# Patient Record
Sex: Female | Born: 1935 | Race: Black or African American | Hispanic: No | Smoking: Never smoker
Health system: Southern US, Community
[De-identification: ages and names within clinical notes are randomized; demographics above are authoritative.]

## PROBLEM LIST (undated history)

## (undated) DIAGNOSIS — R569 Unspecified convulsions: Secondary | ICD-10-CM

## (undated) DIAGNOSIS — D649 Anemia, unspecified: Secondary | ICD-10-CM

## (undated) DIAGNOSIS — S7290XA Unspecified fracture of unspecified femur, initial encounter for closed fracture: Secondary | ICD-10-CM

## (undated) DIAGNOSIS — E46 Unspecified protein-calorie malnutrition: Secondary | ICD-10-CM

## (undated) DIAGNOSIS — F0390 Unspecified dementia without behavioral disturbance: Secondary | ICD-10-CM

## (undated) DIAGNOSIS — N289 Disorder of kidney and ureter, unspecified: Secondary | ICD-10-CM

## (undated) DIAGNOSIS — R262 Difficulty in walking, not elsewhere classified: Secondary | ICD-10-CM

## (undated) DIAGNOSIS — I10 Essential (primary) hypertension: Secondary | ICD-10-CM

## (undated) DIAGNOSIS — R6251 Failure to thrive (child): Secondary | ICD-10-CM

## (undated) DIAGNOSIS — R1310 Dysphagia, unspecified: Secondary | ICD-10-CM

## (undated) HISTORY — PX: MOUTH SURGERY: SHX715

## (undated) HISTORY — PX: APPENDECTOMY: SHX54

---

## 2009-07-27 ENCOUNTER — Ambulatory Visit (HOSPITAL_COMMUNITY): Admission: RE | Admit: 2009-07-27 | Discharge: 2009-07-27 | Payer: Self-pay | Admitting: Internal Medicine

## 2009-08-05 ENCOUNTER — Ambulatory Visit (HOSPITAL_COMMUNITY): Admission: RE | Admit: 2009-08-05 | Discharge: 2009-08-05 | Payer: Self-pay | Admitting: Internal Medicine

## 2010-09-20 ENCOUNTER — Other Ambulatory Visit (HOSPITAL_COMMUNITY): Payer: Self-pay | Admitting: Internal Medicine

## 2010-09-20 DIAGNOSIS — Z139 Encounter for screening, unspecified: Secondary | ICD-10-CM

## 2010-09-28 ENCOUNTER — Ambulatory Visit (HOSPITAL_COMMUNITY)
Admission: RE | Admit: 2010-09-28 | Discharge: 2010-09-28 | Disposition: A | Discharge status: Home / Self Care | Payer: Medicare Other | Source: Ambulatory Visit | Attending: Internal Medicine | Admitting: Internal Medicine

## 2010-09-28 DIAGNOSIS — Z1231 Encounter for screening mammogram for malignant neoplasm of breast: Secondary | ICD-10-CM | POA: Insufficient documentation

## 2010-09-28 DIAGNOSIS — Z139 Encounter for screening, unspecified: Secondary | ICD-10-CM

## 2010-10-09 ENCOUNTER — Emergency Department (HOSPITAL_COMMUNITY)
Admission: EM | Admit: 2010-10-09 | Discharge: 2010-10-09 | Disposition: A | Discharge status: Home / Self Care | Payer: Medicare Other | Attending: Emergency Medicine | Admitting: Emergency Medicine

## 2010-10-09 ENCOUNTER — Encounter: Payer: Self-pay | Admitting: *Deleted

## 2010-10-09 DIAGNOSIS — R51 Headache: Secondary | ICD-10-CM

## 2010-10-09 DIAGNOSIS — I10 Essential (primary) hypertension: Secondary | ICD-10-CM | POA: Insufficient documentation

## 2010-10-09 DIAGNOSIS — R109 Unspecified abdominal pain: Secondary | ICD-10-CM | POA: Insufficient documentation

## 2010-10-09 HISTORY — DX: Essential (primary) hypertension: I10

## 2010-10-09 LAB — URINALYSIS, ROUTINE W REFLEX MICROSCOPIC
Glucose, UA: NEGATIVE mg/dL
Protein, ur: NEGATIVE mg/dL
Specific Gravity, Urine: 1.01 (ref 1.005–1.030)
pH: 6 (ref 5.0–8.0)

## 2010-10-09 LAB — URINE MICROSCOPIC-ADD ON

## 2010-10-09 MED ORDER — ACETAMINOPHEN 500 MG PO TABS: 1000.0000 mg | ORAL_TABLET | Freq: Once | ORAL | Status: DC

## 2010-10-09 NOTE — ED Provider Notes (Signed)
History  Scribed for Dr. Hyacinth Meeker . The chart was partially scribed by Gilman Schmidt.   CSN: [id number] Arrival date & time: 10/09/2010  7:04 PM  Chief Complaint  Patient presents with  . pt c/o hot feeling to head and back    HPI Comments: Pt arrives t 'ed about 7 hours after mopping her floor with somebleach.  She states that she had mild L sided headache which was initially moderate abut has since resolved spontaneously.  She has associated lower back pain which ihas also resolved.  Theres is no aassociated fever, chills, nausea, vomiting, diarrhea, change in vision, cough, sob, or other c/o  She has no c/o on my evaluation.  The history is provided by the patient and a relative.    Past Medical History  Diagnosis Date  . Hypertension     Past Surgical History  Procedure Date  . Mouth surgery     No family history on file.  History  Substance Use Topics  . Smoking status: Never Smoker   . Smokeless tobacco: Not on file  . Alcohol Use: No    OB History    Grav Para Term Preterm Abortions TAB SAB Ect Mult Living                  Review of Systems  All other systems reviewed and are negative.    Physical Exam  BP 169/65  Pulse 104  Temp(Src) 97.5 F (36.4 C) (Oral)  Resp 18  Ht 5\' 4"  (1.626 m)  Wt 151 lb 8 oz (68.72 kg)  BMI 26.00 kg/m2  SpO2 100%  Physical Exam  Nursing note and vitals reviewed. Constitutional: She appears well-developed and well-nourished. No distress.  HENT:  Head: Normocephalic and atraumatic.  Mouth/Throat: Oropharynx is clear and moist. No oropharyngeal exudate.       Dysconjugate gaze - states this is normal for her  Eyes: Conjunctivae and EOM are normal. Pupils are equal, round, and reactive to light. Right eye exhibits no discharge. Left eye exhibits no discharge. No scleral icterus.  Neck: Normal range of motion. Neck supple. No JVD present. No thyromegaly present.  Cardiovascular: Normal rate, regular rhythm, normal heart sounds and  intact distal pulses.  Exam reveals no gallop and no friction rub.   No murmur heard. Pulmonary/Chest: Effort normal and breath sounds normal. No respiratory distress. She has no wheezes. She has no rales.  Abdominal: Soft. Bowel sounds are normal. She exhibits no distension and no mass. There is no tenderness.       Non tender abd and no CVA ttp  Musculoskeletal: Normal range of motion. She exhibits no edema and no tenderness.       No ttp over the spine or the paraspinal areas   Lymphadenopathy:    She has no cervical adenopathy.  Neurological: She is alert. Coordination normal.  Skin: Skin is warm and dry. No rash noted. No erythema.  Psychiatric: She has a normal mood and affect. Her behavior is normal.    ED Course  Procedures  MDM Pt is well appearing VS normal on d/c, no fever, no abd pain, no vomiting, no headache and normal ua.  No indiations for further w/u at this time and pt understands need for return for severe or worsening symptoms.  I have reviewed the results with pt and family who is aware of findings and has expressed understanding and need for appropriate follow up.  All of pt questions pertaining to results have been  answered.   Results for orders placed during the hospital encounter of 10/09/10  URINALYSIS, ROUTINE W REFLEX MICROSCOPIC      Component Value Range   Color, Urine STRAW (*) YELLOW    Appearance CLEAR  CLEAR    Specific Gravity, Urine 1.010  1.005 - 1.030    pH 6.0  5.0 - 8.0    Glucose, UA NEGATIVE  NEGATIVE (mg/dL)   Hgb urine dipstick NEGATIVE  NEGATIVE    Bilirubin Urine NEGATIVE  NEGATIVE    Ketones, ur NEGATIVE  NEGATIVE (mg/dL)   Protein, ur NEGATIVE  NEGATIVE (mg/dL)   Urobilinogen, UA 0.2  0.0 - 1.0 (mg/dL)   Nitrite NEGATIVE  NEGATIVE    Leukocytes, UA TRACE (*) NEGATIVE   URINE MICROSCOPIC-ADD ON      Component Value Range   WBC, UA 3-6  <3 (WBC/hpf)   RBC / HPF 0-2  <3 (RBC/hpf)    I personally performed the services described  in this documentation, which was scribed in my presence. The recorded information has been reviewed and considered. No att. providers found         Vida Roller, MD 10/10/10 [phone]

## 2010-10-09 NOTE — ED Notes (Signed)
Pt self ambulated out stating no needs 

## 2010-10-09 NOTE — ED Notes (Signed)
Pt states she was mopping her floor with bleach and she began to have hot feeling to her head and back.

## 2010-10-12 LAB — URINE CULTURE

## 2011-03-15 DIAGNOSIS — Z0000 Encounter for general adult medical examination without abnormal findings: Secondary | ICD-10-CM | POA: Diagnosis not present

## 2011-03-15 DIAGNOSIS — I10 Essential (primary) hypertension: Secondary | ICD-10-CM | POA: Diagnosis not present

## 2011-06-20 DIAGNOSIS — I10 Essential (primary) hypertension: Secondary | ICD-10-CM | POA: Diagnosis not present

## 2011-06-20 DIAGNOSIS — M1990 Unspecified osteoarthritis, unspecified site: Secondary | ICD-10-CM | POA: Diagnosis not present

## 2011-08-01 DIAGNOSIS — D649 Anemia, unspecified: Secondary | ICD-10-CM | POA: Diagnosis not present

## 2011-08-01 DIAGNOSIS — M7989 Other specified soft tissue disorders: Secondary | ICD-10-CM | POA: Diagnosis not present

## 2011-08-01 DIAGNOSIS — R609 Edema, unspecified: Secondary | ICD-10-CM | POA: Diagnosis not present

## 2011-08-01 DIAGNOSIS — I10 Essential (primary) hypertension: Secondary | ICD-10-CM | POA: Diagnosis not present

## 2011-09-19 DIAGNOSIS — I10 Essential (primary) hypertension: Secondary | ICD-10-CM | POA: Diagnosis not present

## 2011-10-31 DIAGNOSIS — I10 Essential (primary) hypertension: Secondary | ICD-10-CM | POA: Diagnosis not present

## 2012-02-07 DIAGNOSIS — M1990 Unspecified osteoarthritis, unspecified site: Secondary | ICD-10-CM | POA: Diagnosis not present

## 2012-02-07 DIAGNOSIS — I10 Essential (primary) hypertension: Secondary | ICD-10-CM | POA: Diagnosis not present

## 2012-02-14 DIAGNOSIS — S01501A Unspecified open wound of lip, initial encounter: Secondary | ICD-10-CM | POA: Diagnosis not present

## 2012-03-24 ENCOUNTER — Other Ambulatory Visit (HOSPITAL_COMMUNITY): Payer: Self-pay | Admitting: Internal Medicine

## 2012-03-24 DIAGNOSIS — Z139 Encounter for screening, unspecified: Secondary | ICD-10-CM

## 2012-03-29 ENCOUNTER — Ambulatory Visit (HOSPITAL_COMMUNITY)
Admission: RE | Admit: 2012-03-29 | Discharge: 2012-03-29 | Disposition: A | Discharge status: Home / Self Care | Payer: Medicare Other | Source: Ambulatory Visit | Attending: Internal Medicine | Admitting: Internal Medicine

## 2012-03-29 DIAGNOSIS — Z1231 Encounter for screening mammogram for malignant neoplasm of breast: Secondary | ICD-10-CM | POA: Insufficient documentation

## 2012-03-29 DIAGNOSIS — Z139 Encounter for screening, unspecified: Secondary | ICD-10-CM

## 2012-05-08 DIAGNOSIS — I10 Essential (primary) hypertension: Secondary | ICD-10-CM | POA: Diagnosis not present

## 2012-08-14 DIAGNOSIS — I10 Essential (primary) hypertension: Secondary | ICD-10-CM | POA: Diagnosis not present

## 2012-08-14 DIAGNOSIS — E7800 Pure hypercholesterolemia, unspecified: Secondary | ICD-10-CM | POA: Diagnosis not present

## 2012-11-20 DIAGNOSIS — I10 Essential (primary) hypertension: Secondary | ICD-10-CM | POA: Diagnosis not present

## 2012-11-20 DIAGNOSIS — M1990 Unspecified osteoarthritis, unspecified site: Secondary | ICD-10-CM | POA: Diagnosis not present

## 2012-11-28 DIAGNOSIS — I10 Essential (primary) hypertension: Secondary | ICD-10-CM | POA: Diagnosis not present

## 2012-11-28 DIAGNOSIS — M810 Age-related osteoporosis without current pathological fracture: Secondary | ICD-10-CM | POA: Diagnosis not present

## 2012-11-28 DIAGNOSIS — M1990 Unspecified osteoarthritis, unspecified site: Secondary | ICD-10-CM | POA: Diagnosis not present

## 2013-01-15 ENCOUNTER — Encounter (HOSPITAL_COMMUNITY): Payer: Self-pay | Admitting: Emergency Medicine

## 2013-01-15 ENCOUNTER — Emergency Department (HOSPITAL_COMMUNITY)
Admission: EM | Admit: 2013-01-15 | Discharge: 2013-01-15 | Disposition: A | Discharge status: Home / Self Care | Payer: Medicare Other | Attending: Emergency Medicine | Admitting: Emergency Medicine

## 2013-01-15 DIAGNOSIS — R112 Nausea with vomiting, unspecified: Secondary | ICD-10-CM | POA: Diagnosis present

## 2013-01-15 DIAGNOSIS — R1011 Right upper quadrant pain: Secondary | ICD-10-CM | POA: Insufficient documentation

## 2013-01-15 DIAGNOSIS — Z79899 Other long term (current) drug therapy: Secondary | ICD-10-CM | POA: Insufficient documentation

## 2013-01-15 DIAGNOSIS — R1013 Epigastric pain: Secondary | ICD-10-CM | POA: Diagnosis not present

## 2013-01-15 DIAGNOSIS — R1012 Left upper quadrant pain: Secondary | ICD-10-CM | POA: Diagnosis not present

## 2013-01-15 DIAGNOSIS — I10 Essential (primary) hypertension: Secondary | ICD-10-CM | POA: Insufficient documentation

## 2013-01-15 DIAGNOSIS — R109 Unspecified abdominal pain: Secondary | ICD-10-CM

## 2013-01-15 LAB — URINALYSIS, ROUTINE W REFLEX MICROSCOPIC
Nitrite: NEGATIVE
Protein, ur: NEGATIVE mg/dL
Specific Gravity, Urine: 1.015 (ref 1.005–1.030)

## 2013-01-15 LAB — COMPREHENSIVE METABOLIC PANEL
ALT: 10 U/L (ref 0–35)
Albumin: 4 g/dL (ref 3.5–5.2)
Alkaline Phosphatase: 107 U/L (ref 39–117)
BUN: 21 mg/dL (ref 6–23)
CO2: 20 mEq/L (ref 19–32)
Chloride: 100 mEq/L (ref 96–112)
GFR calc Af Amer: 49 mL/min — ABNORMAL LOW (ref >90)
GFR calc non Af Amer: 42 mL/min — ABNORMAL LOW (ref >90)
Sodium: 137 mEq/L (ref 135–145)
Total Bilirubin: 0.2 mg/dL — ABNORMAL LOW (ref 0.3–1.2)

## 2013-01-15 LAB — CBC WITH DIFFERENTIAL/PLATELET
Basophils Relative: 1 % (ref 0–1)
Lymphocytes Relative: 15 % (ref 12–46)
Lymphs Abs: 1.1 10*3/uL (ref 0.7–4.0)
MCHC: 31.8 g/dL (ref 30.0–36.0)
Neutro Abs: 5.9 10*3/uL (ref 1.7–7.7)
Neutrophils Relative %: 79 % — ABNORMAL HIGH (ref 43–77)
Platelets: 295 10*3/uL (ref 150–400)
RBC: 3.68 MIL/uL — ABNORMAL LOW (ref 3.87–5.11)
WBC: 7.3 10*3/uL (ref 4.0–10.5)

## 2013-01-15 LAB — LIPASE, BLOOD: Lipase: 69 U/L — ABNORMAL HIGH (ref 11–59)

## 2013-01-15 LAB — URINE MICROSCOPIC-ADD ON

## 2013-01-15 LAB — TROPONIN I: Troponin I: <0.3 ng/mL (ref <0.30)

## 2013-01-15 MED ORDER — ONDANSETRON HCL 4 MG/2ML IJ SOLN
4.0000 mg | Freq: Once | INTRAMUSCULAR | Status: AC
  Administered 2013-01-15: 4 mg via INTRAVENOUS

## 2013-01-15 MED ORDER — ONDANSETRON HCL 4 MG/2ML IJ SOLN
INTRAMUSCULAR | Status: AC
  Administered 2013-01-15: 4 mg via INTRAVENOUS
  Filled 2013-01-15: qty 2

## 2013-01-15 MED ORDER — ONDANSETRON 4 MG PO TBDP: 4.0000 mg | ORAL_TABLET | Freq: Three times a day (TID) | ORAL | Status: DC | PRN

## 2013-01-15 MED ORDER — GI COCKTAIL ~~LOC~~
30.0000 mL | Freq: Once | ORAL | Status: AC
  Administered 2013-01-15: 30 mL via ORAL

## 2013-01-15 NOTE — ED Notes (Signed)
Pt reports onset of n/v and bilat upper abd pain that began yesterday evening approx 1900 - pt denies any blood noted in emesis, diarrhea or fever.

## 2013-01-15 NOTE — ED Provider Notes (Signed)
CSN: [id number]     Arrival date & time 01/15/13  0301 History   First MD Initiated Contact with Patient 01/15/13 0324     Chief Complaint  Patient presents with  . Nausea  . Emesis  . Abdominal Pain   (Consider location/radiation/quality/duration/timing/severity/associated sxs/prior Treatment) HPI Comments: 79 year old female who presents with a complaint of bilateral upper abdominal pain that began approximately 9 hours prior to arrival. This was at 7:00 at night, she describes it as a cramping and aching sensation that seems to come and go. It comes in waves, it is associated with nausea and vomiting several times prior to arrival but no changes in urinary habits, no abnormalities of the stool including no diarrhea or rectal bleeding and no chest pain or difficulty breathing. She denies having these symptoms in the past, she has had no abdominal surgery except for an appendectomy as a child. She denies alcohol or tobacco use and has no history of cardiac or pulmonary disease. She does not have any pain or nausea at this time.  Patient is a 79 y.o. female presenting with vomiting and abdominal pain. The history is provided by the patient and a relative.  Emesis Associated symptoms: abdominal pain   Abdominal Pain Associated symptoms: vomiting     Past Medical History  Diagnosis Date  . Hypertension    Past Surgical History  Procedure Laterality Date  . Mouth surgery     History reviewed. No pertinent family history. History  Substance Use Topics  . Smoking status: Never Smoker   . Smokeless tobacco: Not on file  . Alcohol Use: No   OB History   Grav Para Term Preterm Abortions TAB SAB Ect Mult Living                 Review of Systems  Gastrointestinal: Positive for vomiting and abdominal pain.  All other systems reviewed and are negative.    Allergies  Review of patient's allergies indicates no known allergies.  Home Medications   Current Outpatient Rx  Name   Route  Sig  Dispense  Refill  . amLODipine (NORVASC) 10 MG tablet   Oral   Take 10 mg by mouth daily.           Marland Kitchen lisinopril-hydrochlorothiazide (PRINZIDE,ZESTORETIC) 20-12.5 MG per tablet   Oral   Take 1 tablet by mouth daily.           . ondansetron (ZOFRAN ODT) 4 MG disintegrating tablet   Oral   Take 1 tablet (4 mg total) by mouth every 8 (eight) hours as needed for nausea.   10 tablet   0    BP 139/73  Pulse 110  Temp(Src) 98.1 F (36.7 C) (Oral)  Resp 20  Ht 5\' 5"  (1.651 m)  Wt 162 lb (73.483 kg)  BMI 26.96 kg/m2  SpO2 99% Physical Exam  Nursing note and vitals reviewed. Constitutional: She appears well-developed and well-nourished. No distress.  HENT:  Head: Normocephalic and atraumatic.  Mouth/Throat: Oropharynx is clear and moist. No oropharyngeal exudate.  Eyes: Conjunctivae and EOM are normal. Pupils are equal, round, and reactive to light. Right eye exhibits no discharge. Left eye exhibits no discharge. No scleral icterus.  Neck: Normal range of motion. Neck supple. No JVD present. No thyromegaly present.  Cardiovascular: Regular rhythm, normal heart sounds and intact distal pulses.  Exam reveals no gallop and no friction rub.   No murmur heard. Heart rate of 110 beats per minute on exam, strong pulses  at the radial arteries, no JVD  Pulmonary/Chest: Effort normal and breath sounds normal. No respiratory distress. She has no wheezes. She has no rales.  Abdominal: Soft. Bowel sounds are normal. She exhibits no distension and no mass. There is tenderness (mild tenderness in the epigastrium, right upper quadrant and left upper quadrant.). There is no rebound and no guarding.  Musculoskeletal: Normal range of motion. She exhibits no edema and no tenderness.  Lymphadenopathy:    She has no cervical adenopathy.  Neurological: She is alert. Coordination normal.  Skin: Skin is warm and dry. No rash noted. No erythema.  Psychiatric: She has a normal mood and affect.  Her behavior is normal.    ED Course  Procedures (including critical care time) Labs Review Labs Reviewed  COMPREHENSIVE METABOLIC PANEL - Abnormal; Notable for the following:    Glucose, Bld 173 (*)    Creatinine, Ser 1.18 (*)    Total Bilirubin 0.2 (*)    GFR calc non Af Amer 42 (*)    GFR calc Af Amer 49 (*)    All other components within normal limits  LIPASE, BLOOD - Abnormal; Notable for the following:    Lipase 69 (*)    All other components within normal limits  CBC WITH DIFFERENTIAL - Abnormal; Notable for the following:    RBC 3.68 (*)    Hemoglobin 10.8 (*)    HCT 34.0 (*)    Neutrophils Relative % 79 (*)    All other components within normal limits  URINALYSIS, ROUTINE W REFLEX MICROSCOPIC - Abnormal; Notable for the following:    Hgb urine dipstick TRACE (*)    Leukocytes, UA SMALL (*)    All other components within normal limits  URINE MICROSCOPIC-ADD ON - Abnormal; Notable for the following:    Squamous Epithelial / LPF FEW (*)    Bacteria, UA FEW (*)    All other components within normal limits  URINE CULTURE  TROPONIN I   Imaging Review No results found.  EKG Interpretation    Date/Time:  Tuesday January 15 2013 03:37:43 EST Ventricular Rate:  108 PR Interval:  180 QRS Duration: 78 QT Interval:  344 QTC Calculation: 460 R Axis:   40 Text Interpretation:  Sinus tachycardia Nonspecific T wave abnormality Abnormal ECG No previous ECGs available Confirmed by Alexx Giambra  MD, Audryana Hockenberry ([id number]) on 01/15/2013 4:11:05 AM            MDM   1. Abdominal pain   2. Nausea and vomiting    The patient has mild to moderate tenderness in the upper abdomen, she has a mild tachycardia and is having intermittent nausea and vomiting. We'll evaluate for abdominal abnormalities including pancreatitis and cholecystitis with laboratory workup, urinalysis, troponin and EKG as well though this seems less likely given the patient's reproducible symptoms. Again she has no chest  pain or shortness of breath.  Pt reexamined at 4:30 - has no pain and no nausea after the GI cocktail - labs reviewed and shows normal LFT's, minimal elevation in lipase, normal WBC and slight anemia.  Trop normal, UA pending.  UA normal - pt asymptomatic,  Stable for d/c.    Pt and family aware of findings and need for f/u.  Meds given in ED:  Medications  ondansetron (ZOFRAN) injection 4 mg (4 mg Intravenous Given 01/15/13 0352)  gi cocktail (Maalox,Lidocaine,Donnatal) (30 mLs Oral Given 01/15/13 0358)    New Prescriptions   ONDANSETRON (ZOFRAN ODT) 4 MG DISINTEGRATING TABLET  Take 1 tablet (4 mg total) by mouth every 8 (eight) hours as needed for nausea.      Vida Roller, MD 01/15/13 [phone]

## 2013-01-15 NOTE — ED Notes (Signed)
Pt ambulating independently w/ steady gait on d/c in no acute distress, A&Ox4. D/c instructions reviewed w/ pt and family - pt and family deny any further questions or concerns at present. Rx given x1  

## 2013-01-16 LAB — URINE CULTURE: Colony Count: 30000

## 2013-05-06 DIAGNOSIS — M25569 Pain in unspecified knee: Secondary | ICD-10-CM | POA: Diagnosis not present

## 2013-05-06 DIAGNOSIS — M25559 Pain in unspecified hip: Secondary | ICD-10-CM | POA: Diagnosis not present

## 2013-05-06 DIAGNOSIS — I10 Essential (primary) hypertension: Secondary | ICD-10-CM | POA: Diagnosis not present

## 2013-05-13 ENCOUNTER — Ambulatory Visit (HOSPITAL_COMMUNITY)
Admission: RE | Admit: 2013-05-13 | Discharge: 2013-05-13 | Disposition: A | Discharge status: Home / Self Care | Payer: Medicare Other | Source: Ambulatory Visit | Attending: Internal Medicine | Admitting: Internal Medicine

## 2013-05-13 ENCOUNTER — Other Ambulatory Visit (HOSPITAL_COMMUNITY): Payer: Self-pay | Admitting: Internal Medicine

## 2013-05-13 DIAGNOSIS — M25559 Pain in unspecified hip: Secondary | ICD-10-CM | POA: Insufficient documentation

## 2013-05-13 DIAGNOSIS — M1610 Unilateral primary osteoarthritis, unspecified hip: Secondary | ICD-10-CM | POA: Diagnosis not present

## 2013-05-13 DIAGNOSIS — D259 Leiomyoma of uterus, unspecified: Secondary | ICD-10-CM | POA: Insufficient documentation

## 2013-05-13 DIAGNOSIS — M25561 Pain in right knee: Secondary | ICD-10-CM

## 2013-05-13 DIAGNOSIS — M25459 Effusion, unspecified hip: Secondary | ICD-10-CM | POA: Insufficient documentation

## 2013-05-13 DIAGNOSIS — IMO0002 Reserved for concepts with insufficient information to code with codable children: Secondary | ICD-10-CM | POA: Diagnosis not present

## 2013-05-13 DIAGNOSIS — M169 Osteoarthritis of hip, unspecified: Secondary | ICD-10-CM | POA: Diagnosis not present

## 2013-05-13 DIAGNOSIS — M1710 Unilateral primary osteoarthritis, unspecified knee: Secondary | ICD-10-CM | POA: Diagnosis not present

## 2013-05-13 DIAGNOSIS — M25551 Pain in right hip: Secondary | ICD-10-CM

## 2013-06-13 ENCOUNTER — Ambulatory Visit (INDEPENDENT_AMBULATORY_CARE_PROVIDER_SITE_OTHER): Payer: Medicare Other | Admitting: Orthopedic Surgery

## 2013-06-13 VITALS — BP 147/76 | Ht 64.0 in | Wt 158.0 lb

## 2013-06-13 DIAGNOSIS — M179 Osteoarthritis of knee, unspecified: Secondary | ICD-10-CM

## 2013-06-13 DIAGNOSIS — IMO0002 Reserved for concepts with insufficient information to code with codable children: Secondary | ICD-10-CM | POA: Diagnosis not present

## 2013-06-13 DIAGNOSIS — M1710 Unilateral primary osteoarthritis, unspecified knee: Secondary | ICD-10-CM | POA: Diagnosis not present

## 2013-06-13 NOTE — Patient Instructions (Signed)
Encounter Diagnosis  Name Primary?  Marland Kitchen Arthritis of knee, degenerative Yes   Osteoarthritis Osteoarthritis is a disease that causes soreness and swelling (inflammation) of a joint. It occurs when the cartilage at the affected joint wears down. Cartilage acts as a cushion, covering the ends of bones where they meet to form a joint. Osteoarthritis is the most common form of arthritis. It often occurs in older people. The joints affected most often by this condition include those in the:  Ends of the fingers.  Thumbs.  Neck.  Lower back.  Knees.  Hips. CAUSES  Over time, the cartilage that covers the ends of bones begins to wear away. This causes bone to rub on bone, producing pain and stiffness in the affected joints.  RISK FACTORS Certain factors can increase your chances of having osteoarthritis, including:  Older age.  Excessive body weight.  Overuse of joints. SIGNS AND SYMPTOMS   Pain, swelling, and stiffness in the joint.  Over time, the joint may lose its normal shape.  Small deposits of bone (osteophytes) may grow on the edges of the joint.  Bits of bone or cartilage can break off and float inside the joint space. This may cause more pain and damage. DIAGNOSIS  Your health care provider will do a physical exam and ask about your symptoms. Various tests may be ordered, such as:  X-rays of the affected joint.  An MRI scan.  Blood tests to rule out other types of arthritis.  Joint fluid tests. This involves using a needle to draw fluid from the joint and examining the fluid under a microscope. TREATMENT  Goals of treatment are to control pain and improve joint function. Treatment plans may include:  A prescribed exercise program that allows for rest and joint relief.  A weight control plan.  Pain relief techniques, such as:  Properly applied heat and cold.  Electric pulses delivered to nerve endings under the skin (transcutaneous electrical nerve  stimulation, TENS).  Massage.  Certain nutritional supplements.  Medicines to control pain, such as:  Acetaminophen.  Nonsteroidal anti-inflammatory drugs (NSAIDs), such as naproxen.  Narcotic or central-acting agents, such as tramadol.  Corticosteroids. These can be given orally or as an injection.  Surgery to reposition the bones and relieve pain (osteotomy) or to remove loose pieces of bone and cartilage. Joint replacement may be needed in advanced states of osteoarthritis. HOME CARE INSTRUCTIONS   Only take over-the-counter or prescription medicines as directed by your health care provider. Take all medicines exactly as instructed.  Maintain a healthy weight. Follow your health care provider's instructions for weight control. This may include dietary instructions.  Exercise as directed. Your health care provider can recommend specific types of exercise. These may include:  Strengthening exercises These are done to strengthen the muscles that support joints affected by arthritis. They can be performed with weights or with exercise bands to add resistance.  Aerobic activities These are exercises, such as brisk walking or low-impact aerobics, that get your heart pumping.  Range-of-motion activities These keep your joints limber.  Balance and agility exercises These help you maintain daily living skills.  Rest your affected joints as directed by your health care provider.  Follow up with your health care provider as directed. SEEK MEDICAL CARE IF:   Your skin turns red.  You develop a rash in addition to your joint pain.  You have worsening joint pain. SEEK IMMEDIATE MEDICAL CARE IF:  You have a significant loss of weight or appetite.  You have a fever along with joint or muscle aches.  You have night sweats. [employer] of Arthritis and Musculoskeletal and Skin Diseases: www.niams.SouthExposed.es [employer] on Aging:  http://kim-miller.com/ American College of Rheumatology: www.rheumatology.org Document Released: 01/24/2005 Document Revised: 11/14/2012 Document Reviewed: 10/01/2012 [facility] Patient Information 2014 [place], [place].

## 2013-06-14 ENCOUNTER — Encounter: Payer: Self-pay | Admitting: Orthopedic Surgery

## 2013-06-14 NOTE — Progress Notes (Signed)
Patient ID: Tina Kline, female   DOB: 1929-12-19, 80 y.o.   MRN: [id number]  Chief Complaint  Patient presents with  . Knee Pain    Right knee pain radiates to hip. Referred by Dr. Legrand Rams   HISTORY: 80 year old female presents with right knee pain long-standing. Denies any catching locking or giving way or mechanical symptoms. Mainly complains of dull aching pain over the right knee joint associated with swelling at times. Pain radiates somewhat proximally and distally but mainly is confined to the right knee. Denies back or hip pain. Pain is relieved by Tylenol extra strength on most occasions.  Review of systems has been completed and signed patient's self-reported review of systems. Please see for details.  She is very healthy. Past Surgical History  Procedure Laterality Date  . Mouth surgery     History  Substance Use Topics  . Smoking status: Never Smoker   . Smokeless tobacco: Not on file  . Alcohol Use: No    Vital signs:  BP 147/76  Ht 5\' 4"  (1.626 m)  Wt 158 lb (71.668 kg)  BMI 27.11 kg/m2  General the patient is well-developed and well-nourished grooming and hygiene are normal Oriented x3 Mood and affect normal Ambulation normal  Inspection of the left knee range of motion is limited by arthritis but she has 120 of flexion slight flexion contracture. No tenderness swelling. Ligaments are stable. Muscle tone normal. Skin clean dry and intact.  Right knee is tender over the medial joint line there is no effusion. Her knee flexion is approximately 120. All ligaments are stable. Motor exam is normal. Skin clean intact.  X-rays show arthritis of the knee mild to moderate Encounter Diagnosis  Name Primary?  Marland Kitchen Arthritis of knee, degenerative Yes    The patient is doing well with Tylenol continue Tylenol extra strength. He did some increased pain use ibuprofen or Aleve on an occasional basis. If her pain or symptoms worsen she will call back and we will further  discuss her treatment options.

## 2013-08-06 DIAGNOSIS — I10 Essential (primary) hypertension: Secondary | ICD-10-CM | POA: Diagnosis not present

## 2013-12-06 DIAGNOSIS — I10 Essential (primary) hypertension: Secondary | ICD-10-CM | POA: Diagnosis not present

## 2013-12-06 DIAGNOSIS — M1990 Unspecified osteoarthritis, unspecified site: Secondary | ICD-10-CM | POA: Diagnosis not present

## 2014-02-21 DIAGNOSIS — Z0000 Encounter for general adult medical examination without abnormal findings: Secondary | ICD-10-CM | POA: Diagnosis not present

## 2014-05-01 DIAGNOSIS — E780 Pure hypercholesterolemia: Secondary | ICD-10-CM | POA: Diagnosis not present

## 2014-05-01 DIAGNOSIS — R7301 Impaired fasting glucose: Secondary | ICD-10-CM | POA: Diagnosis not present

## 2014-05-01 DIAGNOSIS — R739 Hyperglycemia, unspecified: Secondary | ICD-10-CM | POA: Diagnosis not present

## 2014-05-01 DIAGNOSIS — E559 Vitamin D deficiency, unspecified: Secondary | ICD-10-CM | POA: Diagnosis not present

## 2014-05-01 DIAGNOSIS — I10 Essential (primary) hypertension: Secondary | ICD-10-CM | POA: Diagnosis not present

## 2014-05-23 DIAGNOSIS — I10 Essential (primary) hypertension: Secondary | ICD-10-CM | POA: Diagnosis not present

## 2014-05-23 DIAGNOSIS — M1990 Unspecified osteoarthritis, unspecified site: Secondary | ICD-10-CM | POA: Diagnosis not present

## 2014-08-25 DIAGNOSIS — I10 Essential (primary) hypertension: Secondary | ICD-10-CM | POA: Diagnosis not present

## 2014-08-25 DIAGNOSIS — M1990 Unspecified osteoarthritis, unspecified site: Secondary | ICD-10-CM | POA: Diagnosis not present

## 2014-11-07 ENCOUNTER — Emergency Department (HOSPITAL_COMMUNITY): Payer: Medicare Other

## 2014-11-07 ENCOUNTER — Other Ambulatory Visit (HOSPITAL_COMMUNITY): Payer: Self-pay | Admitting: Respiratory Therapy

## 2014-11-07 ENCOUNTER — Other Ambulatory Visit (HOSPITAL_COMMUNITY): Payer: Self-pay | Admitting: Internal Medicine

## 2014-11-07 ENCOUNTER — Ambulatory Visit (HOSPITAL_COMMUNITY)
Admission: RE | Admit: 2014-11-07 | Discharge: 2014-11-07 | Disposition: A | Discharge status: Home / Self Care | Payer: Medicare Other | Source: Ambulatory Visit | Attending: Internal Medicine | Admitting: Internal Medicine

## 2014-11-07 ENCOUNTER — Encounter (HOSPITAL_COMMUNITY): Payer: Self-pay | Admitting: Emergency Medicine

## 2014-11-07 ENCOUNTER — Emergency Department (HOSPITAL_COMMUNITY)
Admission: EM | Admit: 2014-11-07 | Discharge: 2014-11-07 | Disposition: A | Discharge status: Home / Self Care | Payer: Medicare Other | Attending: Emergency Medicine | Admitting: Emergency Medicine

## 2014-11-07 DIAGNOSIS — R739 Hyperglycemia, unspecified: Secondary | ICD-10-CM | POA: Diagnosis not present

## 2014-11-07 DIAGNOSIS — K449 Diaphragmatic hernia without obstruction or gangrene: Secondary | ICD-10-CM | POA: Diagnosis not present

## 2014-11-07 DIAGNOSIS — R55 Syncope and collapse: Secondary | ICD-10-CM | POA: Diagnosis not present

## 2014-11-07 DIAGNOSIS — Z79899 Other long term (current) drug therapy: Secondary | ICD-10-CM | POA: Insufficient documentation

## 2014-11-07 DIAGNOSIS — R112 Nausea with vomiting, unspecified: Secondary | ICD-10-CM

## 2014-11-07 DIAGNOSIS — R1013 Epigastric pain: Secondary | ICD-10-CM | POA: Diagnosis not present

## 2014-11-07 DIAGNOSIS — E559 Vitamin D deficiency, unspecified: Secondary | ICD-10-CM | POA: Diagnosis not present

## 2014-11-07 DIAGNOSIS — I10 Essential (primary) hypertension: Secondary | ICD-10-CM | POA: Diagnosis not present

## 2014-11-07 DIAGNOSIS — E780 Pure hypercholesterolemia: Secondary | ICD-10-CM | POA: Diagnosis not present

## 2014-11-07 DIAGNOSIS — R0989 Other specified symptoms and signs involving the circulatory and respiratory systems: Secondary | ICD-10-CM | POA: Diagnosis present

## 2014-11-07 LAB — I-STAT CHEM 8, ED
BUN: 22 mg/dL — ABNORMAL HIGH (ref 6–20)
CALCIUM ION: 1.21 mmol/L (ref 1.13–1.30)
CREATININE: 1.4 mg/dL — AB (ref 0.44–1.00)
Chloride: 104 mmol/L (ref 101–111)
GLUCOSE: 126 mg/dL — AB (ref 65–99)
HCT: 30 % — ABNORMAL LOW (ref 36.0–46.0)
HEMOGLOBIN: 10.2 g/dL — AB (ref 12.0–15.0)
POTASSIUM: 3.8 mmol/L (ref 3.5–5.1)
Sodium: 140 mmol/L (ref 135–145)
TCO2: 21 mmol/L (ref 0–100)

## 2014-11-07 MED ORDER — SODIUM CHLORIDE 0.9 % IV BOLUS (SEPSIS)
500.0000 mL | Freq: Once | INTRAVENOUS | Status: AC
  Administered 2014-11-07: 500 mL via INTRAVENOUS

## 2014-11-07 NOTE — ED Notes (Signed)
Pt and family state understanding of care given and follow up instructions

## 2014-11-07 NOTE — ED Provider Notes (Signed)
CSN: [id number]     Arrival date & time 11/07/14  1735 History   First MD Initiated Contact with Patient 11/07/14 1737     Chief Complaint  Patient presents with  . Foreign body in throat     (Consider location/radiation/quality/duration/timing/severity/associated sxs/prior Treatment) HPI Patient presents after an episode of chest pain, choking sensation, nausea, vomiting. Symptoms began after the patient was eating, about one hour prior to my evaluation. Currently, during my evaluation, the patient has no complains, denies any chest pain, dyspnea, nausea, or other changes from baseline. Patient states that she felt prior to the development of symptoms, which occurred after she was eating chicken livers. Patient denies substantial medical problems, but states that she has hypertension.  Past Medical History  Diagnosis Date  . Hypertension    Past Surgical History  Procedure Laterality Date  . Mouth surgery     History reviewed. No pertinent family history. Social History  Substance Use Topics  . Smoking status: Never Smoker   . Smokeless tobacco: None  . Alcohol Use: No   OB History    Gravida Para Term Preterm AB TAB SAB Ectopic Multiple Living   6 6 6       4      Review of Systems  Constitutional:       Per HPI, otherwise negative  HENT: Negative for ear pain.        Per HPI, otherwise negative  Respiratory:       Per HPI, otherwise negative  Cardiovascular:       Per HPI, otherwise negative  Gastrointestinal: Positive for nausea and vomiting.  Endocrine:       Negative aside from HPI  Genitourinary:       Neg aside from HPI   Musculoskeletal:       Per HPI, otherwise negative  Skin: Negative.   Neurological: Negative for syncope.      Allergies  Review of patient's allergies indicates no known allergies.  Home Medications   Prior to Admission medications   Medication Sig Start Date End Date Taking? Authorizing Provider  acetaminophen (TYLENOL) 500  MG tablet Take 500 mg by mouth every morning.   Yes Historical Provider, MD  amLODipine (NORVASC) 10 MG tablet Take 10 mg by mouth daily.   Yes Historical Provider, MD  lisinopril-hydrochlorothiazide (PRINZIDE,ZESTORETIC) 20-12.5 MG per tablet Take 1 tablet by mouth daily.     Yes Historical Provider, MD  naproxen sodium (ANAPROX) 220 MG tablet Take 220 mg by mouth every morning.   Yes Historical Provider, MD   BP 147/71 mmHg  Pulse 114  Temp(Src) 98.7 F (37.1 C) (Oral)  Resp 18  Ht 5\' 6"  (1.676 m)  Wt 156 lb (70.761 kg)  BMI 25.19 kg/m2  SpO2 99% Physical Exam  Constitutional: She is oriented to person, place, and time. She appears well-developed and well-nourished. No distress.  HENT:  Head: Normocephalic and atraumatic.  Eyes: Conjunctivae and EOM are normal.  Cardiovascular: Normal rate and regular rhythm.   Pulmonary/Chest: Effort normal and breath sounds normal. No stridor. No respiratory distress.  Abdominal: She exhibits no distension. There is no tenderness.  Musculoskeletal: She exhibits no edema.  Neurological: She is alert and oriented to person, place, and time. No cranial nerve deficit.  Skin: Skin is warm and dry.  Psychiatric: She has a normal mood and affect.  Nursing note and vitals reviewed.   ED Course  Procedures (including critical care time) Labs Review Labs Reviewed  I-STAT CHEM  8, ED - Abnormal; Notable for the following:    BUN 22 (*)    Creatinine, Ser 1.40 (*)    Glucose, Bld 126 (*)    Hemoglobin 10.2 (*)    HCT 30.0 (*)    All other components within normal limits  Randolm Idol, ED    Imaging Review Dg Chest 2 View  11/07/2014   CLINICAL DATA:  Acute choking episode today.  EXAM: CHEST  2 VIEW  COMPARISON:  None.  FINDINGS: Cardiomegaly identified.  A moderate hiatal hernia is present.  There is no evidence of focal airspace disease, pulmonary edema, suspicious pulmonary nodule/mass, pleural effusion, or pneumothorax. No acute bony  abnormalities are identified.  IMPRESSION: Cardiomegaly without evidence of active cardiopulmonary disease.  Moderate hiatal hernia.   Electronically Signed   By: Margarette Canada M.D.   On: 11/07/2014 19:18   I have personally reviewed and evaluated these images and lab results as part of my medical decision-making.  On repeat exam the patient continues to deny any ongoing chest pain. No additional vomiting, and she has been tolerating liquids without complication. I discussed all findings, including extra evidence of hiatal hernia with the patient and her family members.   MDM  He presents after episode of epigastric pain, nausea, vomiting, that occurred after eating. Here the patient has had no ongoing complaint, no foreign body sensation, no chest pain, no dyspnea, no evidence for decompensation. Patient's presentation is consistent with postprandial episode, possibly related to her hiatal hernia. Here she is tolerant of oral intake, and with no evidence for distress, imminent compensation chemistry she was discharged in stable condition to follow-up with primary care.  Carmin Muskrat, MD 11/07/14 2050

## 2014-11-07 NOTE — ED Notes (Signed)
Pt reports feeling like she has something stuck in her throat. Pt was eating chicken livers.

## 2014-11-07 NOTE — Discharge Instructions (Signed)
As discussed, your evaluation today has been largely reassuring.  But, it is important that you monitor your condition carefully, and do not hesitate to return to the ED if you develop new, or concerning changes in your condition. ? ?Otherwise, please follow-up with your physician for appropriate ongoing care. ? ?

## 2014-11-10 ENCOUNTER — Ambulatory Visit (HOSPITAL_COMMUNITY)
Admission: RE | Admit: 2014-11-10 | Discharge: 2014-11-10 | Disposition: A | Discharge status: Home / Self Care | Payer: Medicare Other | Source: Ambulatory Visit | Attending: Internal Medicine | Admitting: Internal Medicine

## 2014-11-10 DIAGNOSIS — I10 Essential (primary) hypertension: Secondary | ICD-10-CM | POA: Insufficient documentation

## 2014-11-10 DIAGNOSIS — R55 Syncope and collapse: Secondary | ICD-10-CM | POA: Insufficient documentation

## 2014-11-10 DIAGNOSIS — I6523 Occlusion and stenosis of bilateral carotid arteries: Secondary | ICD-10-CM | POA: Insufficient documentation

## 2014-11-16 NOTE — Progress Notes (Signed)
Patient ID: Tina Kline, female   DOB: 1930/02/08, 81 y.o.   MRN: [id number]     Cardiology Office Note   Date:  11/17/2014   ID:  Tina Kline, DOB 05/15/1929, MRN [id number]  PCP:  [name], MD  Cardiologist:   Jenkins Rouge, MD   No chief complaint on file.     History of Present Illness: Tina Kline is a 81 y.o. female who presents for syncope.  History of HTN.  Seen in ER 9/30 with chest pain after eating chicken livers and diagnosed with postprandial pain from hiatal hernia. Associated with nausea and vomiting.  CXR showed moderate hiatal hernia and CE NAD.  Labs reviewed troponin negative Hct 34 lipase mildly elevated    Carotid 11/10/14 reviewed plaque no significant stenosis   During office visit earlier 9/30 daughter indicated patient was hard to arouse from recliner the week before. This was not really syncope.  Sounds like patient was sleeping and took a while to wake up No seizure activity.  Daughter was calling EMS when "mom came to" was her normal self  She is widowed.  Does all ADL's and does not need cane or walker to ambulate Does have bad left knee  No previous cardiac issues.  Takes meds as ordered  Has 3 daughters and son that are local and look after her   Past Medical History  Diagnosis Date  . Hypertension     Past Surgical History  Procedure Laterality Date  . Mouth surgery       Current Outpatient Prescriptions  Medication Sig Dispense Refill  . acetaminophen (TYLENOL) 500 MG tablet Take 500 mg by mouth every morning.    Marland Kitchen amLODipine (NORVASC) 10 MG tablet Take 10 mg by mouth daily.    Marland Kitchen lisinopril-hydrochlorothiazide (PRINZIDE,ZESTORETIC) 20-12.5 MG per tablet Take 1 tablet by mouth daily.      . naproxen sodium (ANAPROX) 220 MG tablet Take 220 mg by mouth every morning.     No current facility-administered medications for this visit.    Allergies:   Review of patient's allergies indicates no known allergies.    Social  History:  The patient  reports that she has never smoked. She does not have any smokeless tobacco history on file. She reports that she does not drink alcohol or use illicit drugs.   Family History:  The patient's family history is not on file.    ROS:  Please see the history of present illness.   Otherwise, review of systems are positive for none.   All other systems are reviewed and negative.    PHYSICAL EXAM: VS:  BP 124/68 mmHg  Pulse 82  Ht 5\' 4"  (1.626 m)  Wt 68.493 kg (151 lb)  BMI 25.91 kg/m2  SpO2 99% , BMI Body mass index is 25.91 kg/(m^2). Affect appropriate Healthy:  appears stated age 27: skewed vision  Neck supple with no adenopathy JVP normal no bruits no thyromegaly Lungs clear with no wheezing and good diaphragmatic motion Heart:  S1/S2 no murmur, no rub, gallop or click PMI normal Abdomen: benighn, BS positve, no tenderness, no AAA no bruit.  No HSM or HJR Distal pulses intact with no bruits Plus one bilateral edema Neuro non-focal Skin warm and dry No muscular weakness Osteoarthritis of left knee    EKG:   11/07/14  SR rate 99 PVC nonspecific ST changes no change from 01/2013 when rate was 108   Recent Labs: 11/07/2014: BUN 22*; Creatinine, Ser 1.40*; Hemoglobin  10.2*; Potassium 3.8; Sodium 140    Lipid Panel No results found for: CHOL, TRIG, HDL, CHOLHDL, VLDL, LDLCALC, LDLDIRECT    Wt Readings from Last 3 Encounters:  11/17/14 68.493 kg (151 lb)  11/07/14 70.761 kg (156 lb)  06/13/13 71.668 kg (158 lb)      Other studies Reviewed: Additional studies/ records that were reviewed today include:  Dr Josephine Cables office notes and labs.    ASSESSMENT AND PLAN:  1.  "Syncope"  My history really doesn't support this  No high risk features on exam, ECG or history f/u echo to r/o structural heart disease 2. PVC: isolated on ECG no palpitations or clinical symptoms  Echo to interpret in context of EF 3. HTN:  Stable continue current meds  4.  Arthritis:  Tylenol / ASA for pain f/u ortho consider injections.     Current medicines are reviewed at length with the patient today.  The patient does not have concerns regarding medicines.  The following changes have been made:  no change  Labs/ tests ordered today include: Echo  No orders of the defined types were placed in this encounter.     Disposition:   FU with me PRN     Signed, Jenkins Rouge, MD  11/17/2014 9:56 AM    [facility] [address], [place], [place]  [address] Phone: [phone]; Fax: [phone]

## 2014-11-17 ENCOUNTER — Encounter: Payer: Self-pay | Admitting: Cardiovascular Disease

## 2014-11-17 ENCOUNTER — Ambulatory Visit (INDEPENDENT_AMBULATORY_CARE_PROVIDER_SITE_OTHER): Payer: Medicare Other | Admitting: Cardiovascular Disease

## 2014-11-17 VITALS — BP 124/68 | HR 82 | Ht 64.0 in | Wt 151.0 lb

## 2014-11-17 DIAGNOSIS — R55 Syncope and collapse: Secondary | ICD-10-CM

## 2014-11-17 NOTE — Patient Instructions (Signed)
Your physician recommends that you schedule a follow-up appointment in: as needed    Your physician recommends that you continue on your current medications as directed. Please refer to the Current Medication list given to you today.      Your physician has requested that you have an echocardiogram. Echocardiography is a painless test that uses sound waves to create images of your heart. It provides your doctor with information about the size and shape of your heart and how well your heart's chambers and valves are working. This procedure takes approximately one hour. There are no restrictions for this procedure.      Thank you for choosing [facility] !

## 2014-11-19 ENCOUNTER — Ambulatory Visit (HOSPITAL_COMMUNITY)
Admission: RE | Admit: 2014-11-19 | Discharge: 2014-11-19 | Disposition: A | Discharge status: Home / Self Care | Payer: Medicare Other | Source: Ambulatory Visit | Attending: Cardiovascular Disease | Admitting: Cardiovascular Disease

## 2014-11-19 DIAGNOSIS — R55 Syncope and collapse: Secondary | ICD-10-CM

## 2014-11-24 DIAGNOSIS — I10 Essential (primary) hypertension: Secondary | ICD-10-CM | POA: Diagnosis not present

## 2014-11-24 DIAGNOSIS — R55 Syncope and collapse: Secondary | ICD-10-CM | POA: Diagnosis not present

## 2014-11-24 DIAGNOSIS — D649 Anemia, unspecified: Secondary | ICD-10-CM | POA: Diagnosis not present

## 2015-03-31 DIAGNOSIS — M170 Bilateral primary osteoarthritis of knee: Secondary | ICD-10-CM | POA: Diagnosis not present

## 2015-03-31 DIAGNOSIS — E785 Hyperlipidemia, unspecified: Secondary | ICD-10-CM | POA: Diagnosis not present

## 2015-03-31 DIAGNOSIS — Z0000 Encounter for general adult medical examination without abnormal findings: Secondary | ICD-10-CM | POA: Diagnosis not present

## 2015-03-31 DIAGNOSIS — I10 Essential (primary) hypertension: Secondary | ICD-10-CM | POA: Diagnosis not present

## 2015-03-31 DIAGNOSIS — R739 Hyperglycemia, unspecified: Secondary | ICD-10-CM | POA: Diagnosis not present

## 2015-03-31 DIAGNOSIS — E559 Vitamin D deficiency, unspecified: Secondary | ICD-10-CM | POA: Diagnosis not present

## 2015-04-07 DIAGNOSIS — D649 Anemia, unspecified: Secondary | ICD-10-CM | POA: Diagnosis not present

## 2015-04-10 DIAGNOSIS — N182 Chronic kidney disease, stage 2 (mild): Secondary | ICD-10-CM | POA: Diagnosis not present

## 2015-04-10 DIAGNOSIS — D509 Iron deficiency anemia, unspecified: Secondary | ICD-10-CM | POA: Diagnosis not present

## 2015-04-10 DIAGNOSIS — I10 Essential (primary) hypertension: Secondary | ICD-10-CM | POA: Diagnosis not present

## 2015-04-20 ENCOUNTER — Telehealth: Payer: Self-pay

## 2015-04-20 NOTE — Telephone Encounter (Signed)
PT had received a letter to schedule colonoscopy. Someone left Vm to call to schedule.  I returned call, many rings and no answer.

## 2015-04-24 NOTE — Telephone Encounter (Signed)
I called and spoke to pt's daughter, Rayna Sexton, who said her mom has never had a colonoscopy. Said she is anemic. ( In the ov note from Dr. Legrand Rams he said pt was given a stool card, [name] said they are working on getting that done).   OV with Neil Crouch, PA on 05/08/2015 at 9:30 Am.

## 2015-05-08 ENCOUNTER — Ambulatory Visit: Payer: Medicare Other | Admitting: Gastroenterology

## 2015-06-08 ENCOUNTER — Ambulatory Visit (INDEPENDENT_AMBULATORY_CARE_PROVIDER_SITE_OTHER): Payer: Medicare Other | Admitting: Gastroenterology

## 2015-06-08 ENCOUNTER — Encounter: Payer: Self-pay | Admitting: Gastroenterology

## 2015-06-08 VITALS — BP 145/78 | HR 107 | Temp 98.7°F | Ht 63.0 in | Wt 148.4 lb

## 2015-06-08 DIAGNOSIS — N189 Chronic kidney disease, unspecified: Secondary | ICD-10-CM

## 2015-06-08 DIAGNOSIS — D509 Iron deficiency anemia, unspecified: Secondary | ICD-10-CM | POA: Diagnosis not present

## 2015-06-08 HISTORY — PX: ESOPHAGOGASTRODUODENOSCOPY: SHX1529

## 2015-06-08 NOTE — Progress Notes (Signed)
Primary Care Physician:  [name], MD  Primary Gastroenterologist:  Barney Drain, MD   Chief Complaint  Patient presents with  . Anemia  . Colonoscopy    HPI:  Tina Kline is a 82 y.o. female here At the request of Dr. Legrand Rams for consideration of colonoscopy. According to the patient's daughter she has had some issues with anemia. She is taking iron pill once daily. Hemoccult status unknown. Patient has been noncompliant with completing them. She has some issues with constipation. Takes Dulcolax as needed. Denies overt GI bleeding. She has never had a colonoscopy. She takes Aleve once daily for arthritic pain. Unclear whether she is taking aspirin or not. There was some discussion today a months the daughter and the patient noted clear answer. Patient denies heartburn, dysphagia, unintentional weight loss.    Current Outpatient Prescriptions  Medication Sig Dispense Refill  . acetaminophen (TYLENOL) 500 MG tablet Take 500 mg by mouth every morning.    Marland Kitchen amLODipine (NORVASC) 10 MG tablet Take 10 mg by mouth daily.    . ferrous sulfate 325 (65 FE) MG tablet Take 325 mg by mouth daily with breakfast.    . lisinopril (PRINIVIL,ZESTRIL) 20 MG tablet     . naproxen sodium (ANAPROX) 220 MG tablet Take 220 mg by mouth daily.     No current facility-administered medications for this visit.    Allergies as of 06/08/2015  . (No Known Allergies)    Past Medical History  Diagnosis Date  . Hypertension     Past Surgical History  Procedure Laterality Date  . Mouth surgery    . Appendectomy      Family History  Problem Relation Age of Onset  . Colon cancer Brother     ?  Marland Kitchen Breast cancer Daughter   . Pancreatic cancer Son     Social History   Social History  . Marital Status: Widowed    Spouse Name: N/A  . Number of Children: 6  . Years of Education: N/A   Occupational History  . Not on file.   Social History Main Topics  . Smoking status: Never Smoker   .  Smokeless tobacco: Not on file  . Alcohol Use: No  . Drug Use: No  . Sexual Activity: Not on file   Other Topics Concern  . Not on file   Social History Narrative      ROS:  General: Negative for anorexia, weight loss, fever, chills, fatigue, weakness. Eyes: Negative for vision changes.  ENT: Negative for hoarseness, difficulty swallowing , nasal congestion. CV: Negative for chest pain, angina, palpitations, dyspnea on exertion, peripheral edema.  Respiratory: Negative for dyspnea at rest, dyspnea on exertion, cough, sputum, wheezing.  GI: See history of present illness. GU:  Negative for dysuria, hematuria, urinary incontinence, urinary frequency, nocturnal urination.  MS: Positive for joint pain, no low back pain.  Derm: Negative for rash or itching.  Neuro: Negative for weakness, abnormal sensation, seizure, frequent headaches, memory loss, confusion.  Psych: Negative for anxiety, depression, suicidal ideation, hallucinations.  Endo: Negative for unusual weight change.  Heme: Negative for bruising or bleeding. Allergy: Negative for rash or hives.    Physical Examination:  BP 145/78 mmHg  Pulse 107  Temp(Src) 98.7 F (37.1 C) (Oral)  Ht 5\' 3"  (1.6 m)  Wt 148 lb 6.4 oz (67.314 kg)  BMI 26.29 kg/m2   General: Well-nourished, well-developed in no acute distress.  Head: Normocephalic, atraumatic.   Eyes: Conjunctiva pink, no icterus. Mouth:  Oropharyngeal mucosa moist and pink , no lesions erythema or exudate. Neck: Supple without thyromegaly, masses, or lymphadenopathy.  Lungs: Clear to auscultation bilaterally.  Heart: Regular rate and rhythm, no murmurs rubs or gallops.  Abdomen: Bowel sounds are normal, nontender, nondistended, no hepatosplenomegaly or masses, no abdominal bruits or    hernia , no rebound or guarding.   Rectal: Not performed Extremities: No lower extremity edema. No clubbing or deformities.  Neuro: Alert and oriented x 4 , grossly normal  neurologically.  Skin: Warm and dry, no rash or jaundice.   Psych: Alert and cooperative, normal mood and affect.  Labs: None available  Imaging Studies: No results found.

## 2015-06-08 NOTE — Patient Instructions (Signed)
1. Please complete stool specimen and return to our office. 2. Please have your labs done.

## 2015-06-10 LAB — CBC WITH DIFFERENTIAL/PLATELET
Basophils Absolute: 42 cells/uL (ref 0–200)
Basophils Relative: 1 %
EOS PCT: 10 %
Eosinophils Absolute: 420 cells/uL (ref 15–500)
HCT: 32.6 % — ABNORMAL LOW (ref 35.0–45.0)
HEMOGLOBIN: 10.3 g/dL — AB (ref 11.7–15.5)
LYMPHS ABS: 1638 {cells}/uL (ref 850–3900)
Lymphocytes Relative: 39 %
MCH: 28.5 pg (ref 27.0–33.0)
MCHC: 31.6 g/dL — AB (ref 32.0–36.0)
MCV: 90.3 fL (ref 80.0–100.0)
MONOS PCT: 8 %
MPV: 9.9 fL (ref 7.5–12.5)
Monocytes Absolute: 336 cells/uL (ref 200–950)
NEUTROS ABS: 1764 {cells}/uL (ref 1500–7800)
NEUTROS PCT: 42 %
PLATELETS: 289 10*3/uL (ref 140–400)
RBC: 3.61 MIL/uL — AB (ref 3.80–5.10)
RDW: 17.4 % — ABNORMAL HIGH (ref 11.0–15.0)
WBC: 4.2 10*3/uL (ref 3.8–10.8)

## 2015-06-10 LAB — BASIC METABOLIC PANEL
BUN: 14 mg/dL (ref 7–25)
CALCIUM: 9.6 mg/dL (ref 8.6–10.4)
CHLORIDE: 108 mmol/L (ref 98–110)
CO2: 22 mmol/L (ref 20–31)
CREATININE: 1.2 mg/dL — AB (ref 0.60–0.88)
Glucose, Bld: 189 mg/dL — ABNORMAL HIGH (ref 65–99)
Potassium: 3.7 mmol/L (ref 3.5–5.3)
Sodium: 142 mmol/L (ref 135–146)

## 2015-06-10 LAB — IRON AND TIBC
%SAT: 12 % (ref 11–50)
IRON: 46 ug/dL (ref 45–160)
TIBC: 384 ug/dL (ref 250–450)
UIBC: 338 ug/dL (ref 125–400)

## 2015-06-10 LAB — FERRITIN: FERRITIN: 26 ng/mL (ref 20–288)

## 2015-06-10 NOTE — Progress Notes (Signed)
cc'ed to pcp °

## 2015-06-10 NOTE — Assessment & Plan Note (Addendum)
82 year old female who presents for consideration of colonoscopy. According to the family she has had some anemia. Hemoccults have not been done because patient has been noncompliant. No overt GI bleeding noted. Questionable family history of colon cancer, brother .She has chronic constipation but otherwise no GI symptoms. We will update labs. If evidence of IDA, would consider further GI workup. Encouraged patient to complete single I FOBT. Further recommendations to follow.

## 2015-06-15 ENCOUNTER — Other Ambulatory Visit: Payer: Self-pay

## 2015-06-15 DIAGNOSIS — D509 Iron deficiency anemia, unspecified: Secondary | ICD-10-CM

## 2015-06-15 MED ORDER — PEG 3350-KCL-NA BICARB-NACL 420 G PO SOLR
4000.0000 mL | ORAL | Status: DC
Start: 2015-06-15 — End: 2015-06-26

## 2015-06-15 NOTE — Progress Notes (Signed)
Quick Note:  PT and her daughter, [name] are aware of results and OK to schedule the procedure. I told about the Doculox, please put on her instructions. Thanks! ______

## 2015-06-15 NOTE — Progress Notes (Signed)
Quick Note:  Please let patient know her labs indicate likely IDA given she low normal ferritin, iron in setting of oral iron therapy.  Offer her a colonoscopy+/-EGD with Dr. Oneida Alar. If she is agreeable, would recommend dulcolax 10mg  po daily for 3 days prior to bowel prep due to constipation. ______

## 2015-06-26 ENCOUNTER — Ambulatory Visit (HOSPITAL_COMMUNITY)
Admission: RE | Admit: 2015-06-26 | Discharge: 2015-06-26 | Disposition: A | Discharge status: Home Health | Payer: Medicare Other | Source: Ambulatory Visit | Attending: Gastroenterology | Admitting: Gastroenterology

## 2015-06-26 ENCOUNTER — Encounter (HOSPITAL_COMMUNITY): Payer: Self-pay | Admitting: *Deleted

## 2015-06-26 ENCOUNTER — Encounter (HOSPITAL_COMMUNITY)
Admission: RE | Disposition: A | Discharge status: Home Health | Payer: Self-pay | Source: Ambulatory Visit | Attending: Gastroenterology

## 2015-06-26 DIAGNOSIS — K2970 Gastritis, unspecified, without bleeding: Secondary | ICD-10-CM | POA: Diagnosis not present

## 2015-06-26 DIAGNOSIS — K449 Diaphragmatic hernia without obstruction or gangrene: Secondary | ICD-10-CM | POA: Diagnosis not present

## 2015-06-26 DIAGNOSIS — Q394 Esophageal web: Secondary | ICD-10-CM | POA: Insufficient documentation

## 2015-06-26 DIAGNOSIS — D124 Benign neoplasm of descending colon: Secondary | ICD-10-CM | POA: Insufficient documentation

## 2015-06-26 DIAGNOSIS — I10 Essential (primary) hypertension: Secondary | ICD-10-CM | POA: Diagnosis not present

## 2015-06-26 DIAGNOSIS — R634 Abnormal weight loss: Secondary | ICD-10-CM | POA: Insufficient documentation

## 2015-06-26 DIAGNOSIS — Z800 Family history of malignant neoplasm of digestive organs: Secondary | ICD-10-CM | POA: Insufficient documentation

## 2015-06-26 DIAGNOSIS — D509 Iron deficiency anemia, unspecified: Secondary | ICD-10-CM | POA: Insufficient documentation

## 2015-06-26 DIAGNOSIS — Z79899 Other long term (current) drug therapy: Secondary | ICD-10-CM | POA: Diagnosis not present

## 2015-06-26 DIAGNOSIS — Z803 Family history of malignant neoplasm of breast: Secondary | ICD-10-CM | POA: Diagnosis not present

## 2015-06-26 DIAGNOSIS — K648 Other hemorrhoids: Secondary | ICD-10-CM | POA: Insufficient documentation

## 2015-06-26 DIAGNOSIS — K222 Esophageal obstruction: Secondary | ICD-10-CM | POA: Insufficient documentation

## 2015-06-26 DIAGNOSIS — R1310 Dysphagia, unspecified: Secondary | ICD-10-CM | POA: Diagnosis not present

## 2015-06-26 DIAGNOSIS — Q438 Other specified congenital malformations of intestine: Secondary | ICD-10-CM | POA: Diagnosis not present

## 2015-06-26 HISTORY — PX: COLONOSCOPY: SHX5424

## 2015-06-26 SURGERY — COLONOSCOPY
Anesthesia: Moderate Sedation

## 2015-06-26 MED ORDER — MEPERIDINE HCL 50 MG/ML IJ SOLN
INTRAMUSCULAR | Status: DC | PRN
  Administered 2015-06-26: 25 mg via INTRAVENOUS

## 2015-06-26 MED ORDER — MINERAL OIL PO OIL
TOPICAL_OIL | ORAL | Status: AC
  Filled 2015-06-26: qty 30

## 2015-06-26 MED ORDER — OMEPRAZOLE 20 MG PO CPDR: DELAYED_RELEASE_CAPSULE | ORAL | Status: DC

## 2015-06-26 MED ORDER — MEPERIDINE HCL 100 MG/ML IJ SOLN
INTRAMUSCULAR | Status: AC
  Filled 2015-06-26: qty 2

## 2015-06-26 MED ORDER — MIDAZOLAM HCL 5 MG/5ML IJ SOLN
INTRAMUSCULAR | Status: AC
  Filled 2015-06-26: qty 10

## 2015-06-26 MED ORDER — LIDOCAINE VISCOUS 2 % MT SOLN
OROMUCOSAL | Status: AC
  Filled 2015-06-26: qty 15

## 2015-06-26 MED ORDER — MEPERIDINE HCL 100 MG/ML IJ SOLN
INTRAMUSCULAR | Status: DC | PRN
  Administered 2015-06-26 (×4): 25 mg via INTRAVENOUS

## 2015-06-26 MED ORDER — MIDAZOLAM HCL 5 MG/5ML IJ SOLN
INTRAMUSCULAR | Status: DC | PRN
Start: 2015-06-26 — End: 2015-06-26
  Administered 2015-06-26: 1 mg via INTRAVENOUS

## 2015-06-26 MED ORDER — SODIUM CHLORIDE 0.9 % IV SOLN
INTRAVENOUS | Status: DC
  Administered 2015-06-26 (×2): via INTRAVENOUS

## 2015-06-26 MED ORDER — MIDAZOLAM HCL 5 MG/5ML IJ SOLN
INTRAMUSCULAR | Status: DC | PRN
  Administered 2015-06-26 (×2): 2 mg via INTRAVENOUS
  Administered 2015-06-26 (×3): 1 mg via INTRAVENOUS

## 2015-06-26 MED ORDER — LIDOCAINE VISCOUS 2 % MT SOLN
OROMUCOSAL | Status: DC | PRN
  Administered 2015-06-26: 5 mL via OROMUCOSAL

## 2015-06-26 MED ORDER — SIMETHICONE 40 MG/0.6ML PO SUSP
ORAL | Status: DC | PRN
  Administered 2015-06-26: 2.5 mL

## 2015-06-26 MED ORDER — MEPERIDINE HCL 50 MG/ML IJ SOLN
INTRAMUSCULAR | Status: DC
Start: 2015-06-26 — End: 2015-06-26
  Filled 2015-06-26: qty 1

## 2015-06-26 NOTE — Op Note (Signed)
[facility] Patient Name: Tina Kline Procedure Date: 06/26/2015 2:21 PM MRN: [id number] Date of Birth: 1929/12/12 Attending MD: Barney Drain , MD CSN: [id number] Age: 82 Admit Type: Outpatient Procedure:                Colonoscopy Indications:              Unexplained iron deficiency anemia, Weight loss Providers:                Barney Drain, MD, Lurline Del, RN, Randa Spike,                            Technician Referring MD:             [name] Medicines:                Meperidine 100 mg IV, Midazolam 7 mg IV Complications:            No immediate complications. Estimated Blood Loss:     Estimated blood loss was minimal. Procedure:                Pre-Anesthesia Assessment:                           - Prior to the procedure, a History and Physical                            was performed, and patient medications and                            allergies were reviewed. The patient's tolerance of                            previous anesthesia was also reviewed. The risks                            and benefits of the procedure and the sedation                            options and risks were discussed with the patient.                            All questions were answered, and informed consent                            was obtained. Prior Anticoagulants: The patient has                            taken naproxen, last dose was day of procedure. ASA                            Grade Assessment: II - A patient with mild systemic                            disease. After reviewing the risks and benefits,  the patient was deemed in satisfactory condition to                            undergo the procedure.                           After obtaining informed consent, the colonoscope                            was passed under direct vision. Throughout the                            procedure, the patient's blood pressure, pulse, and              oxygen saturations were monitored continuously. The                            EC-[id number] scope was introduced through                            the anus and advanced to the the cecum, identified                            by appendiceal orifice and ileocecal valve. The                            ileocecal valve, appendiceal orifice, and rectum                            were photographed. The colonoscopy was technically                            difficult and complex due to significant looping.                            Successful completion of the procedure was aided by                            changing the patient to a supine position, using                            manual pressure and straightening and shortening                            the scope to obtain bowel loop reduction. The                            patient tolerated the procedure well. The quality                            of the bowel preparation was excellent. Scope In: 3:07:07 PM Scope Out: 3:35:20 PM Scope Withdrawal Time: 0 hours 17 minutes 31 seconds  Total Procedure Duration: 0 hours 28 minutes 13 seconds  Findings:      The  recto-sigmoid colon and transverse colon were significantly       redundant. Advancing the scope required changing the patient to a supine       position and using manual pressure.      A 4 mm polyp was found in the descending colon. The polyp was sessile.       The polyp was removed with a cold biopsy forceps. Resection and       retrieval were complete.      The exam was otherwise without abnormality.      Internal hemorrhoids were found. The hemorrhoids were small. Impression:               - Redundant colon.                           - One 4 mm polyp in the descending colon, removed                            with a cold biopsy forceps. Resected and retrieved.                           - The examination was otherwise normal.                           - Internal  hemorrhoids. Moderate Sedation:      Moderate (conscious) sedation was administered by the endoscopy nurse       and supervised by the endoscopist. The following parameters were       monitored: oxygen saturation, heart rate, blood pressure, and response       to care. Total physician intraservice time was 45 minutes. Recommendation:           - High fiber diet.                           - Continue present medications.                           - Await pathology results.                           START OMEPRAZOLE. TAKE 30 MINUTES PRIOR TO YOUR                            MEALS TWICE DAILY FOR 3 MOS THEN ONCE DAILY IF SHE                            CONTINUES TAKING NAPROXEN.                           REPEAT EGD to stretch esophagus IN 3 WEEKS.                           - Patient has a contact number available for                            emergencies. The signs and symptoms of potential  delayed complications were discussed with the                            patient. Return to normal activities tomorrow.                            Written discharge instructions were provided to the                            patient.                           - No repeat colonoscopy due to age. Procedure Code(s):        --- Professional ---                           [phone], Colonoscopy, flexible; with biopsy, single                            or multiple                           99153, Moderate sedation services; each additional                            15 minutes intraservice time                           99153, Moderate sedation services; each additional                            15 minutes intraservice time                           G0500, Moderate sedation services provided by the                            same physician or other qualified health care                            professional performing a gastrointestinal                            endoscopic service  that sedation supports,                            requiring the presence of an independent trained                            observer to assist in the monitoring of the                            patient's level of consciousness and physiological                            status; initial 15 minutes of intra-service time;  patient age 54 years or older (additional time may                            be reported with [phone], as appropriate) Diagnosis Code(s):        --- Professional ---                           D12.4, Benign neoplasm of descending colon                           K64.8, Other hemorrhoids                           D50.9, Iron deficiency anemia, unspecified                           R63.4, Abnormal weight loss                           Q43.8, Other specified congenital malformations of                            intestine CPT copyright 2016 [employer]. All rights reserved. The codes documented in this report are preliminary and upon coder review may  be revised to meet current compliance requirements. Barney Drain, MD Barney Drain, MD 06/26/2015 9:51:49 PM This report has been signed electronically. Number of Addenda: 0

## 2015-06-26 NOTE — H&P (Signed)
  Primary Care Physician:  [name], MD Primary Gastroenterologist:  Dr. Oneida Alar  Pre-Procedure History & Physical: HPI:  Tina Kline is a 82 y.o. female here for  ANEMIA.  Past Medical History  Diagnosis Date  . Hypertension     Past Surgical History  Procedure Laterality Date  . Mouth surgery    . Appendectomy      Prior to Admission medications   Medication Sig Start Date End Date Taking? Authorizing Provider  acetaminophen (TYLENOL) 500 MG tablet Take 500 mg by mouth every morning.   Yes Historical Provider, MD  amLODipine (NORVASC) 10 MG tablet Take 10 mg by mouth daily.   Yes Historical Provider, MD  ferrous sulfate 325 (65 FE) MG tablet Take 325 mg by mouth daily with breakfast.   Yes Historical Provider, MD  lisinopril (PRINIVIL,ZESTRIL) 20 MG tablet Take 20 mg by mouth daily.  05/11/15  Yes Historical Provider, MD  naproxen sodium (ANAPROX) 220 MG tablet Take 220 mg by mouth daily.   Yes Historical Provider, MD  polyethylene glycol-electrolytes (TRILYTE) 420 g solution Take 4,000 mLs by mouth as directed. 06/15/15  Yes Danie Binder, MD    Allergies as of 06/15/2015  . (No Known Allergies)    Family History  Problem Relation Age of Onset  . Colon cancer Brother     ?  Marland Kitchen Breast cancer Daughter   . Pancreatic cancer Son     Social History   Social History  . Marital Status: Widowed    Spouse Name: N/A  . Number of Children: 6  . Years of Education: N/A   Occupational History  . Not on file.   Social History Main Topics  . Smoking status: Never Smoker   . Smokeless tobacco: Not on file  . Alcohol Use: No  . Drug Use: No  . Sexual Activity: Not on file   Other Topics Concern  . Not on file   Social History Narrative    Review of Systems: See HPI, otherwise negative ROS   Physical Exam: BP 160/76 mmHg  Pulse 108  Temp(Src) 98.4 F (36.9 C) (Oral)  Resp 17  Ht 5\' 3"  (1.6 m)  Wt 148 lb (67.132 kg)  BMI 26.22 kg/m2  SpO2  100% General:   Alert,  pleasant and cooperative in NAD Head:  Normocephalic and atraumatic. Neck:  Supple; Lungs:  Clear throughout to auscultation.    Heart:  Regular rate and rhythm. Abdomen:  Soft, nontender and nondistended. Normal bowel sounds, without guarding, and without rebound.   Neurologic:  Alert and  oriented x4;  grossly normal neurologically.  Impression/Plan:     Anemia PLAN:  1. TCS/EGD TODAY

## 2015-06-26 NOTE — Progress Notes (Signed)
REVIEWED-NO ADDITIONAL RECOMMENDATIONS. 

## 2015-06-26 NOTE — Op Note (Signed)
[facility] Patient Name: Tina Kline Procedure Date: 06/26/2015 3:37 PM MRN: [id number] Date of Birth: 03-20-1929 Attending MD: Barney Drain , MD CSN: [id number] Age: 82 Admit Type: Outpatient Procedure:                Upper GI endoscopy WITH BIOPSY AND DILATION Indications:              Unexplained iron deficiency anemia, Dysphagia Providers:                Barney Drain, MD, Lurline Del, RN, Randa Spike,                            Technician Referring MD:             [name] Medicines:                TCS + STARTED AT 1711-Meperidine 25 mg IV,                            Midazolam 2 mg IV Complications:            No immediate complications. Estimated Blood Loss:     Estimated blood loss: none. Procedure:                Pre-Anesthesia Assessment:                           - Prior to the procedure, a History and Physical                            was performed, and patient medications and                            allergies were reviewed. The patient's tolerance of                            previous anesthesia was also reviewed. The risks                            and benefits of the procedure and the sedation                            options and risks were discussed with the patient.                            All questions were answered, and informed consent                            was obtained. Prior Anticoagulants: The patient has                            taken naproxen, last dose was day of procedure. ASA                            Grade Assessment: II - A patient with mild systemic  disease. After reviewing the risks and benefits,                            the patient was deemed in satisfactory condition to                            undergo the procedure.                           After obtaining informed consent, the endoscope was                            passed under direct vision. Throughout the     procedure, the patient's blood pressure, pulse, and                            oxygen saturations were monitored continuously. The                            [id number]) scope was introduced through the                            mouth, and advanced to the second part of duodenum.                            The [id number] scope was introduced through                            the and advanced to the. The upper GI endoscopy was                            technically difficult and complex due to                            challenging esophageal intubation because of                            abnormal patient anatomy. Successful completion of                            the procedure was aided by withdrawing the scope                            and replacing with the pediatric endoscope. The                            patient tolerated the procedure fairly well.                           COLONOSCOPE OUT AT 1535. ADULT GASTROSCOPE OUT AT                            1559. PEDS GASTROSCOPE IN AT 1722. PEDS SCOPE OUT  AT 1732. Scope In: 5:22:26 PM Scope Out: 5:30:35 PM Total Procedure Duration: 0 hours 8 minutes 9 seconds  Findings:      A web was found in the proximal esophagus.      A moderate Schatzki ring (acquired) was found at the gastroesophageal       junction. A guidewire was placed and the scope was withdrawn. Dilation       was performed with a Savary dilator with mild resistance at 10 mm, 11       mm, 12 mm, 12.8 mm and 14 mm.      A medium-sized hiatal hernia was present.      Diffuse moderate inflammation characterized by congestion (edema),       erosions and erythema was found in the gastric antrum.      The duodenal bulb and area of the papilla were normal. Impression:               - Web in the proximal esophagus.                           - Moderate Schatzki ring. Dilated.                           - Medium-sized hiatal hernia.                            - Gastritis. Moderate Sedation:      Moderate (conscious) sedation was administered by the endoscopy nurse       and supervised by the endoscopist. The following parameters were       monitored: oxygen saturation, heart rate, blood pressure, and response       to care. Total physician intraservice time was 21 minutes. Recommendation:           - Patient has a contact number available for                            emergencies. The signs and symptoms of potential                            delayed complications were discussed with the                            patient. Return to normal activities tomorrow.                            Written discharge instructions were provided to the                            patient.                           - Mechanical soft diet.                           - Continue present medications.                           - Repeat upper endoscopy in 3 weeks for  surveillance.                           - Return to GI office in 4 weeks. Procedure Code(s):        --- Professional ---                           [phone], Esophagogastroduodenoscopy, flexible,                            transoral; with insertion of guide wire followed by                            passage of dilator(s) through esophagus over guide                            wire                           G0500, Moderate sedation services provided by the                            same physician or other qualified health care                            professional performing a gastrointestinal                            endoscopic service that sedation supports,                            requiring the presence of an independent trained                            observer to assist in the monitoring of the                            patient's level of consciousness and physiological                            status; initial 15 minutes of intra-service  time;                            patient age 59 years or older (additional time may                            be reported with [phone], as appropriate) Diagnosis Code(s):        --- Professional ---                           Q39.4, Esophageal web                           K22.2, Esophageal obstruction  K44.9, Diaphragmatic hernia without obstruction or                            gangrene                           K29.70, Gastritis, unspecified, without bleeding                           D50.9, Iron deficiency anemia, unspecified                           R13.10, Dysphagia, unspecified CPT copyright 2016 [employer]. All rights reserved. The codes documented in this report are preliminary and upon coder review may  be revised to meet current compliance requirements. Barney Drain, MD Barney Drain, MD 06/26/2015 9:59:40 PM This report has been signed electronically. Number of Addenda: 0

## 2015-06-26 NOTE — Progress Notes (Signed)
Dr. Oneida Alar left patient in care of the nurse at 1600, until scope became available. Patient monitored until Dr. Oneida Alar returned at 1711 to do the upper endoscopy. Patient had already had colonoscopy completed. (Pediatric upper scope not available.) Available at 1500. Called Dr. Oneida Alar to let her know it was available. Monitored patient from 1600 until 1511, while waiting on the scope to become available. VSS. Patient arousable,, but sleeping comfortably. No complications noted.

## 2015-07-01 ENCOUNTER — Encounter (HOSPITAL_COMMUNITY): Payer: Self-pay | Admitting: Gastroenterology

## 2015-07-07 DIAGNOSIS — D649 Anemia, unspecified: Secondary | ICD-10-CM | POA: Diagnosis not present

## 2015-07-07 DIAGNOSIS — M1990 Unspecified osteoarthritis, unspecified site: Secondary | ICD-10-CM | POA: Diagnosis not present

## 2015-07-07 DIAGNOSIS — Z0000 Encounter for general adult medical examination without abnormal findings: Secondary | ICD-10-CM | POA: Diagnosis not present

## 2015-07-07 DIAGNOSIS — E785 Hyperlipidemia, unspecified: Secondary | ICD-10-CM | POA: Diagnosis not present

## 2015-07-07 DIAGNOSIS — R739 Hyperglycemia, unspecified: Secondary | ICD-10-CM | POA: Diagnosis not present

## 2015-07-07 DIAGNOSIS — I10 Essential (primary) hypertension: Secondary | ICD-10-CM | POA: Diagnosis not present

## 2015-07-07 DIAGNOSIS — E559 Vitamin D deficiency, unspecified: Secondary | ICD-10-CM | POA: Diagnosis not present

## 2015-07-07 DIAGNOSIS — N183 Chronic kidney disease, stage 3 (moderate): Secondary | ICD-10-CM | POA: Diagnosis not present

## 2015-07-07 DIAGNOSIS — D509 Iron deficiency anemia, unspecified: Secondary | ICD-10-CM | POA: Diagnosis not present

## 2015-07-09 ENCOUNTER — Telehealth: Payer: Self-pay | Admitting: Gastroenterology

## 2015-07-09 ENCOUNTER — Encounter: Payer: Self-pay | Admitting: Gastroenterology

## 2015-07-09 NOTE — Telephone Encounter (Signed)
Please call pt. She had a simple adenoma removed.  CONTINUE OMEPRAZOLE.  TAKE 30 MINUTES PRIOR TO YOUR MEALS TWICE DAILY FOR 3 MOS THEN ONCE DAILY IF SHE CONTINUES TAKING NAPROXEN.  REPEAT EGD/DIL to stretch esophagus ON JUN 16.  OPV IN SEP 2017 DX: DYSPHAGIA/GASTRITIS.

## 2015-07-09 NOTE — Telephone Encounter (Signed)
PLEASE CALL PT. SHE SHOULD WAIT NO MORE THAN 3 MOS TO HAVE HER ESOPHAGUS STRETCHED AGAIN.

## 2015-07-09 NOTE — Discharge Instructions (Signed)
I dilated her esophagus. 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  SHE HAS A LARGE HIATAL HERNIA WITH EROSIONS AND GASTRITIS DUE TO NAPROXEN.  She had one Colon polyp removed. She has hemorrhoids.   START OMEPRAZOLE.  TAKE 30 MINUTES PRIOR TO YOUR MEALS TWICE DAILY FOR 3 MOS THEN ONCE DAILY IF SHE CONTINUES TAKING NAPROXEN.  REPEAT EGD to stretch esophagus IN 3 WEEKS.    UPPER ENDOSCOPY AFTER CARE Read the instructions outlined below and refer to this sheet in the next week. These discharge instructions provide you with general information on caring for yourself after you leave the hospital. While your treatment has been planned according to the most current medical practices available, unavoidable complications occasionally occur. If you have any problems or questions after discharge, call DR. Yvette Roark, [phone].  ACTIVITY  You may resume your regular activity, but move at a slower pace for the next 24 hours.   Take frequent rest periods for the next 24 hours.   Walking will help get rid of the air and reduce the bloated feeling in your belly (abdomen).   No driving for 24 hours (because of the medicine (anesthesia) used during the test).   You may shower.   Do not sign any important legal documents or operate any machinery for 24 hours (because of the anesthesia used during the test).    NUTRITION  Drink plenty of fluids.   You may resume your normal diet as instructed by your doctor.   Begin with a light meal and progress to your normal diet. Heavy or fried foods are harder to digest and may make you feel sick to your stomach (nauseated).   Avoid alcoholic beverages for 24 hours or as instructed.    MEDICATIONS  You may resume your normal medications.   WHAT YOU CAN EXPECT TODAY  Some feelings of bloating in the abdomen.   Passage of more gas than usual.    IF YOU HAD A BIOPSY TAKEN DURING THE UPPER ENDOSCOPY:  Eat a soft diet IF YOU  HAVE NAUSEA, BLOATING, ABDOMINAL PAIN, OR VOMITING.    FINDING OUT THE RESULTS OF YOUR TEST Not all test results are available during your visit. DR. Oneida Alar WILL CALL YOU WITHIN 7 DAYS OF YOUR PROCEDUE WITH YOUR RESULTS. Do not assume everything is normal if you have not heard from DR. Haywood Meinders IN ONE WEEK, CALL HER OFFICE AT [phone].  SEEK IMMEDIATE MEDICAL ATTENTION AND CALL THE OFFICE: [phone] IF:  You have more than a spotting of blood in your stool.   Your belly is swollen (abdominal distention).   You are nauseated or vomiting.   You have a temperature over 101F.   You have abdominal pain or discomfort that is severe or gets worse throughout the day.  Gastritis  Gastritis is an inflammation (the body's way of reacting to injury and/or infection) of the stomach. It is often caused by viral or bacterial (germ) infections. It can also be caused BY ASPIRIN, BC/GOODY POWDER'S, (IBUPROFEN) MOTRIN, OR ALEVE (NAPROXEN), chemicals (including alcohol), SPICY FOODS, and medications. This illness may be associated with generalized malaise (feeling tired, not well), UPPER ABDOMINAL STOMACH cramps, and fever. One common bacterial cause of gastritis is an organism known as H. Pylori. This can be treated with antibiotics.   ESOPHAGEAL STRICTURE  Esophageal strictures can be caused by stomach acid backing up into the tube that carries food from the mouth down to the stomach (lower esophagus).  TREATMENT There are a number of medicines used to treat reflux/stricture, including: Antacids.  Proton-pump inhibitors: PRILOSEC  HOME CARE INSTRUCTIONS Eat 2-3 hours before going to bed.  Try to reach and maintain a healthy weight.  Do not eat just a few very large meals. Instead, eat 4 TO 6 smaller meals throughout the day.  Try to identify foods and beverages that make your symptoms worse, and avoid these.  Avoid tight clothing.  Do not exercise right after eating.  Colonoscopy, Care  After Refer to this sheet in the next few weeks. These instructions provide you with information on caring for yourself after your procedure. Your health care provider may also give you more specific instructions. Your treatment has been planned according to current medical practices, but problems sometimes occur. Call your health care provider if you have any problems or questions after your procedure. WHAT TO EXPECT AFTER THE PROCEDURE  After your procedure, it is typical to have the following:  A small amount of blood in your stool.  Moderate amounts of gas and mild abdominal cramping or bloating. HOME CARE INSTRUCTIONS  Do not drive, operate machinery, or sign important documents for 24 hours.  You may shower and resume your regular physical activities, but move at a slower pace for the first 24 hours.  Take frequent rest periods for the first 24 hours.  Walk around or put a warm pack on your abdomen to help reduce abdominal cramping and bloating.  Drink enough fluids to keep your urine clear or pale yellow.  You may resume your normal diet as instructed by your health care provider. Avoid heavy or fried foods that are hard to digest.  Avoid drinking alcohol for 24 hours or as instructed by your health care provider.  Only take over-the-counter or prescription medicines as directed by your health care provider.  If a tissue sample (biopsy) was taken during your procedure:  Do not take aspirin or blood thinners for 7 days, or as instructed by your health care provider.  Do not drink alcohol for 7 days, or as instructed by your health care provider.  Eat soft foods for the first 24 hours. SEEK MEDICAL CARE IF: You have persistent spotting of blood in your stool 2-3 days after the procedure. SEEK IMMEDIATE MEDICAL CARE IF:  You have more than a small spotting of blood in your stool.  You pass large blood clots in your stool.  Your abdomen is swollen (distended).  You have  nausea or vomiting.  You have a fever.  You have increasing abdominal pain that is not relieved with medicine.   This information is not intended to replace advice given to you by your health care provider. Make sure you discuss any questions you have with your health care provider.   Colon Polyps Polyps are lumps of extra tissue growing inside the body. Polyps can grow in the large intestine (colon). Most colon polyps are noncancerous (benign). However, some colon polyps can become cancerous over time. Polyps that are larger than a pea may be harmful. To be safe, caregivers remove and test all polyps. CAUSES  Polyps form when mutations in the genes cause your cells to grow and divide even though no more tissue is needed. RISK FACTORS There are a number of risk factors that can increase your chances of getting colon polyps. They include:  Being older than 50 years.  Family history of colon polyps or colon cancer.  Long-term colon diseases, such as colitis or  Crohn disease.  Being overweight.  Smoking.  Being inactive.  Drinking too much alcohol. SYMPTOMS  Most small polyps do not cause symptoms. If symptoms are present, they may include:  Blood in the stool. The stool may look dark red or black.  Constipation or diarrhea that lasts longer than 1 week. DIAGNOSIS People often do not know they have polyps until their caregiver finds them during a regular checkup. Your caregiver can use 4 tests to check for polyps:  Digital rectal exam. The caregiver wears gloves and feels inside the rectum. This test would find polyps only in the rectum.  Barium enema. The caregiver puts a liquid called barium into your rectum before taking X-rays of your colon. Barium makes your colon look white. Polyps are dark, so they are easy to see in the X-ray pictures.  Sigmoidoscopy. A thin, flexible tube (sigmoidoscope) is placed into your rectum. The sigmoidoscope has a light and tiny camera in it.  The caregiver uses the sigmoidoscope to look at the last third of your colon.  Colonoscopy. This test is like sigmoidoscopy, but the caregiver looks at the entire colon. This is the most common method for finding and removing polyps. TREATMENT  Any polyps will be removed during a sigmoidoscopy or colonoscopy. The polyps are then tested for cancer. PREVENTION  To help lower your risk of getting more colon polyps:  Eat plenty of fruits and vegetables. Avoid eating fatty foods.  Do not smoke.  Avoid drinking alcohol.  Exercise every day.  Lose weight if recommended by your caregiver.  Eat plenty of calcium and folate. Foods that are rich in calcium include milk, cheese, and broccoli. Foods that are rich in folate include chickpeas, kidney beans, and spinach. HOME CARE INSTRUCTIONS Keep all follow-up appointments as directed by your caregiver. You may need periodic exams to check for polyps. SEEK MEDICAL CARE IF: You notice bleeding during a bowel movement.   This information is not intended to replace advice given to you by your health care provider. Make sure you discuss any questions you have with your health care provider.

## 2015-07-09 NOTE — Telephone Encounter (Signed)
Spoke with pt and she is not wanting to have her esophagus stretched right now this soon. Wants to know how far out she can wait before having it done again

## 2015-07-09 NOTE — Telephone Encounter (Signed)
appt made and letter sent  °

## 2015-08-14 DIAGNOSIS — N183 Chronic kidney disease, stage 3 (moderate): Secondary | ICD-10-CM | POA: Diagnosis not present

## 2015-08-14 DIAGNOSIS — I10 Essential (primary) hypertension: Secondary | ICD-10-CM | POA: Diagnosis not present

## 2015-08-14 DIAGNOSIS — M1990 Unspecified osteoarthritis, unspecified site: Secondary | ICD-10-CM | POA: Diagnosis not present

## 2015-09-03 ENCOUNTER — Encounter: Payer: Self-pay | Admitting: General Practice

## 2015-10-06 DIAGNOSIS — I10 Essential (primary) hypertension: Secondary | ICD-10-CM | POA: Diagnosis not present

## 2015-10-06 DIAGNOSIS — M1990 Unspecified osteoarthritis, unspecified site: Secondary | ICD-10-CM | POA: Diagnosis not present

## 2015-10-06 DIAGNOSIS — N183 Chronic kidney disease, stage 3 (moderate): Secondary | ICD-10-CM | POA: Diagnosis not present

## 2015-10-06 DIAGNOSIS — D509 Iron deficiency anemia, unspecified: Secondary | ICD-10-CM | POA: Diagnosis not present

## 2015-10-09 ENCOUNTER — Other Ambulatory Visit: Payer: Self-pay

## 2015-10-13 ENCOUNTER — Ambulatory Visit: Payer: Medicare Other | Admitting: Nurse Practitioner

## 2015-10-13 ENCOUNTER — Telehealth: Payer: Self-pay | Admitting: Nurse Practitioner

## 2015-10-13 ENCOUNTER — Encounter: Payer: Self-pay | Admitting: Nurse Practitioner

## 2015-10-13 NOTE — Telephone Encounter (Signed)
PT WAS A NO SHOW AND LETTER SENT  °

## 2015-10-14 NOTE — Telephone Encounter (Signed)
Noted  

## 2016-01-07 DIAGNOSIS — I10 Essential (primary) hypertension: Secondary | ICD-10-CM | POA: Diagnosis not present

## 2016-01-07 DIAGNOSIS — N183 Chronic kidney disease, stage 3 (moderate): Secondary | ICD-10-CM | POA: Diagnosis not present

## 2016-01-07 DIAGNOSIS — M1990 Unspecified osteoarthritis, unspecified site: Secondary | ICD-10-CM | POA: Diagnosis not present

## 2016-01-13 ENCOUNTER — Ambulatory Visit: Payer: Self-pay | Admitting: Orthopaedic Surgery

## 2016-01-20 ENCOUNTER — Encounter: Payer: Self-pay | Admitting: Gastroenterology

## 2016-01-20 ENCOUNTER — Telehealth: Payer: Self-pay

## 2016-01-20 ENCOUNTER — Other Ambulatory Visit: Payer: Self-pay

## 2016-01-20 ENCOUNTER — Ambulatory Visit (INDEPENDENT_AMBULATORY_CARE_PROVIDER_SITE_OTHER): Payer: Medicare Other | Admitting: Gastroenterology

## 2016-01-20 DIAGNOSIS — R1310 Dysphagia, unspecified: Secondary | ICD-10-CM

## 2016-01-20 DIAGNOSIS — R1319 Other dysphagia: Secondary | ICD-10-CM

## 2016-01-20 NOTE — Patient Instructions (Signed)
ON DEC 20 FOLLOW A FULL LIQUID DIET 2 DAYS PRIOR TO UPPER ENDOSCOPY. SEE INFO BELOW.  ADD BOOST, ENSURE, OR CARNATION INSTANT BREAKFAST WITH SOY MILK 3 CANS DAILY.  CONTINUE OMEPRAZOLE.  TAKE 30 MINUTES PRIOR TO YOUR FIRST MEAL.  UPPER ENDOSCOPY TO DILATE YOUR ESOPHAGUS ON DEC 22. RISKS INCLUDE: < 1% chance of medication reaction, perforation, OR bleeding.  FOLLOW UP IN 4 MOS.    Full Liquid Diet A high-calorie, high-protein supplement should be used to meet your nutritional requirements when the full liquid diet is continued for more than 2 or 3 days. If this diet is to be used for an extended period of time (more than 7 days), a multivitamin should be considered.  Breads and Starches  Allowed: None are allowed except crackersWHOLE OR pureed (made into a thick, smooth soup) in soup.   Avoid: Any others.    Potatoes/Pasta/Rice  Allowed: ANY ITEM AS A SOUP OR SMALL PLATE OF MASHED POTATOES OR SCRAMBLED EGGS.       Vegetables  Allowed: Strained tomato or vegetable juice. Vegetables pureed in soup.   Avoid: Any others.    Fruit  Allowed: Any strained fruit juices and fruit drinks. Include 1 serving of citrus or vitamin C-enriched fruit juice daily.   Avoid: Any others.  Meat and Meat Substitutes  Allowed: Egg  Avoid: Any meat, fish, or fowl. All cheese.  Milk  Allowed: SOY Milk beverages, including milk shakes and instant breakfast mixes. Smooth yogurt.   Avoid: Any others. Avoid dairy products if not tolerated.    Soups and Combination Foods  Allowed: Broth, strained cream soups. Strained, broth-based soups.   Avoid: Any others.    Desserts and Sweets  Allowed: flavored gelatin, tapioca, ice cream, sherbet, smooth pudding, junket, fruit ices, frozen ice pops, pudding pops, frozen fudge pops, chocolate syrup. Sugar, honey, jelly, syrup.   Avoid: Any others.  Fats and Oils  Allowed: Margarine, butter, cream, sour cream, oils.   Avoid: Any others.   Beverages  Allowed: All.   Avoid: None.  Condiments  Allowed: Iodized salt, pepper, spices, flavorings. Cocoa powder.   Avoid: Any others.    SAMPLE MEAL PLAN Breakfast   cup orange juice.   1 OR 2 EGGS  1 cup milk.   1 cup beverage (coffee or tea).   Cream or sugar, if desired.    Midmorning Snack  2 SCRAMBLED OR HARD BOILED EGG   Lunch  1 cup cream soup.    cup fruit juice.   1 cup milk.    cup custard.   1 cup beverage (coffee or tea).   Cream or sugar, if desired.    Midafternoon Snack  1 cup milk shake.  Dinner  1 cup cream soup.    cup fruit juice.   1 cup MILK    cup pudding.   1 cup beverage (coffee or tea).   Cream or sugar, if desired.  Evening Snack  1 cup supplement.  To increase calories, add sugar, cream, butter, or margarine if possible. Nutritional supplements will also increase the total calories.

## 2016-01-20 NOTE — Assessment & Plan Note (Addendum)
CLINICALLY IMPROVED AFTER EGD/DIL AMY 2017. FAILED TO RETURN TO COMPLETE DILATION.   FOLLOW A FULL LIQUID DIET 2 DAYS PRIOR TO EGD/DIL.  HANDOUT GIVEN. ADD BOOST, ENSURE, OR CARNATION INSTANT BREAKFAST WITH SOY MILK 3 CANS DAILY. NO IRON AFTER DEC 14. CONTINUE OMEPRAZOLE.  TAKE 30 MINUTES PRIOR TO YOUR FIRST MEAL. UPPER ENDOSCOPY TO DILATE YOUR ESOPHAGUS ON DEC 22. DISCUSSED PROCEDURE, BENEFITS, & RISKS: < 1% chance of medication reaction, perforation, OR bleeding. FOLLOW UP IN 4 MOS.

## 2016-01-20 NOTE — Progress Notes (Signed)
Subjective:     Patient ID: Tina Kline, female   DOB: [birth year], 82 y.o.   MRN: [id number]  [name], MD  HPI Losing weight. Diet modification. Won't swallow pills. WAS EATING 2 PANCAKES, 2 EGGS, TOAST, AND COFFEE. NOW TROUBLE WITH SOLID FOODS. CHANGED ALL HER EATING HABITS. BMS: GOOD. APPETITE: GOOD. WEIGHT: MAY 2017-148 LBS AND NOW 134 LBS.  PT DENIES FEVER, CHILLS, HEMATOCHEZIA, HEMATEMESIS, nausea, vomiting, melena, diarrhea, CHEST PAIN, SHORTNESS OF BREATH,  CHANGE IN BOWEL IN HABITS, constipation, abdominal pain,  problems with sedation, OR heartburn or indigestion.  Past Medical History:  Diagnosis Date  . Hypertension     Past Surgical History:  Procedure Laterality Date  . APPENDECTOMY    . COLONOSCOPY N/A 06/26/2015   Procedure: COLONOSCOPY;  Surgeon: Danie Binder, MD;  Location: [facility];  Service: Endoscopy;  Laterality: N/A;  215 - moved to 2:00 - office to notify  . MOUTH SURGERY      Current Outpatient Prescriptions  Medication Sig Dispense Refill  . acetaminophen (TYLENOL) 500 MG tablet Take 500 mg by mouth every morning.    Marland Kitchen amLODipine (NORVASC) 10 MG tablet Take 10 mg by mouth daily.    . ferrous sulfate 325 (65 FE) MG tablet Take 325 mg by mouth daily with breakfast.    . lisinopril (PRINIVIL,ZESTRIL) 20 MG tablet Take 20 mg by mouth daily.     . naproxen sodium (ANAPROX) 220 MG tablet Take 220 mg by mouth daily.    Marland Kitchen omeprazole (PRILOSEC) 20 MG capsule 1 po bid 30 minutes before meals for 3 mos then once daily FOREVER     Family History  Problem Relation Age of Onset  . Colon cancer Brother     ?  Marland Kitchen Breast cancer Daughter   . Pancreatic cancer Son     Social History   Social History  . Marital status: Widowed    Spouse name: N/A  . Number of children: 6  . Years of education: N/A   Social History Main Topics  . Smoking status: Never Smoker  . Smokeless tobacco: None  . Alcohol use No  . Drug use: No  . Sexual activity: Not  Asked   Review of Systems PER HPI OTHERWISE ALL SYSTEMS ARE NEGATIVE.    Objective:   Physical Exam  Constitutional: She is oriented to person, place, and time. She appears well-developed and well-nourished. No distress.  HENT:  Head: Normocephalic and atraumatic.  Mouth/Throat: Oropharynx is clear and moist. No oropharyngeal exudate.  STRABISMUS, UPPER AND LOWER DENTURES  Eyes: Pupils are equal, round, and reactive to light. No scleral icterus.  Neck: Normal range of motion. Neck supple.  Cardiovascular: Normal rate, regular rhythm and normal heart sounds.   Pulmonary/Chest: Effort normal and breath sounds normal. No respiratory distress.  Abdominal: Soft. Bowel sounds are normal. She exhibits no distension. There is no tenderness.  Musculoskeletal: She exhibits no edema.  Lymphadenopathy:    She has no cervical adenopathy.  Neurological: She is alert and oriented to person, place, and time.  NO  NEW FOCAL DEFICITS  Psychiatric:  FLAT AFFECT, NL MOOD  Vitals reviewed.     Assessment:         Plan:

## 2016-01-20 NOTE — Telephone Encounter (Signed)
Called and spoke to pt's daughter [name]), informed of time change for EGD/DIL 01/29/16. Procedure time will be 2:15pm, arrive at 1:15pm. Pt may have liquids for breakfast the morning of procedure. NPO after 9:00am.

## 2016-01-20 NOTE — Progress Notes (Signed)
CC'ED TO PCP 

## 2016-01-29 ENCOUNTER — Encounter (HOSPITAL_COMMUNITY)
Admission: RE | Disposition: A | Discharge status: Home / Self Care | Payer: Self-pay | Source: Ambulatory Visit | Attending: Gastroenterology

## 2016-01-29 ENCOUNTER — Ambulatory Visit (HOSPITAL_COMMUNITY)
Admission: RE | Admit: 2016-01-29 | Discharge: 2016-01-29 | Disposition: A | Discharge status: Home / Self Care | Payer: Medicare Other | Source: Ambulatory Visit | Attending: Gastroenterology | Admitting: Gastroenterology

## 2016-01-29 ENCOUNTER — Encounter (HOSPITAL_COMMUNITY): Payer: Self-pay | Admitting: *Deleted

## 2016-01-29 DIAGNOSIS — Z809 Family history of malignant neoplasm, unspecified: Secondary | ICD-10-CM | POA: Insufficient documentation

## 2016-01-29 DIAGNOSIS — K222 Esophageal obstruction: Secondary | ICD-10-CM | POA: Insufficient documentation

## 2016-01-29 DIAGNOSIS — R1310 Dysphagia, unspecified: Secondary | ICD-10-CM

## 2016-01-29 DIAGNOSIS — Z79899 Other long term (current) drug therapy: Secondary | ICD-10-CM | POA: Insufficient documentation

## 2016-01-29 DIAGNOSIS — I10 Essential (primary) hypertension: Secondary | ICD-10-CM | POA: Insufficient documentation

## 2016-01-29 DIAGNOSIS — K449 Diaphragmatic hernia without obstruction or gangrene: Secondary | ICD-10-CM | POA: Diagnosis not present

## 2016-01-29 DIAGNOSIS — Q394 Esophageal web: Secondary | ICD-10-CM | POA: Diagnosis not present

## 2016-01-29 DIAGNOSIS — K2970 Gastritis, unspecified, without bleeding: Secondary | ICD-10-CM | POA: Diagnosis not present

## 2016-01-29 DIAGNOSIS — Z803 Family history of malignant neoplasm of breast: Secondary | ICD-10-CM | POA: Insufficient documentation

## 2016-01-29 DIAGNOSIS — Z800 Family history of malignant neoplasm of digestive organs: Secondary | ICD-10-CM | POA: Insufficient documentation

## 2016-01-29 HISTORY — PX: ESOPHAGOGASTRODUODENOSCOPY: SHX5428

## 2016-01-29 HISTORY — PX: SAVORY DILATION: SHX5439

## 2016-01-29 SURGERY — EGD (ESOPHAGOGASTRODUODENOSCOPY)
Anesthesia: Moderate Sedation

## 2016-01-29 MED ORDER — SODIUM CHLORIDE 0.9 % IV SOLN
INTRAVENOUS | Status: DC
  Administered 2016-01-29: 1000 mL via INTRAVENOUS

## 2016-01-29 MED ORDER — LIDOCAINE VISCOUS 2 % MT SOLN
OROMUCOSAL | Status: DC | PRN
  Administered 2016-01-29: 4 mL via OROMUCOSAL

## 2016-01-29 MED ORDER — MIDAZOLAM HCL 5 MG/5ML IJ SOLN
INTRAMUSCULAR | Status: AC
  Filled 2016-01-29: qty 10

## 2016-01-29 MED ORDER — MIDAZOLAM HCL 5 MG/5ML IJ SOLN
INTRAMUSCULAR | Status: DC | PRN
  Administered 2016-01-29: 2 mg via INTRAVENOUS
  Administered 2016-01-29: 1 mg via INTRAVENOUS
  Administered 2016-01-29: 2 mg via INTRAVENOUS

## 2016-01-29 MED ORDER — MEPERIDINE HCL 100 MG/ML IJ SOLN
INTRAMUSCULAR | Status: DC | PRN
  Administered 2016-01-29: 25 mg via INTRAVENOUS
  Administered 2016-01-29: 50 mg via INTRAVENOUS
  Administered 2016-01-29: 25 mg via INTRAVENOUS

## 2016-01-29 MED ORDER — MEPERIDINE HCL 100 MG/ML IJ SOLN
INTRAMUSCULAR | Status: AC
  Filled 2016-01-29: qty 2

## 2016-01-29 MED ORDER — LIDOCAINE VISCOUS 2 % MT SOLN
OROMUCOSAL | Status: AC
  Filled 2016-01-29: qty 15

## 2016-01-29 MED ORDER — STERILE WATER FOR IRRIGATION IR SOLN
Status: DC | PRN
  Administered 2016-01-29: 100 mL

## 2016-01-29 MED ORDER — MINERAL OIL PO OIL
TOPICAL_OIL | ORAL | Status: AC
  Filled 2016-01-29: qty 30

## 2016-01-29 NOTE — Discharge Instructions (Signed)
I dilated her esophagus. 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  SHE HAS A LARGE HIATAL HERNIA AND GASTRITIS.   FOLLOW A SOFT MECHANICAL DIET.  MEATS SHOULD BE CHOPPED OR GROUND ONLY. DO NOT EAT CHUNKS OF ANYTHING. VEGETABLES NEEDS TO BE SOFT LIKE MASHED POTATOES.  CONTINUE OMEPRAZOLE.  TAKE 30 MINUTES PRIOR TO YOUR MEALS TWICE DAILY FOREVER.   REPEAT UPPER ENDOSCOPY TO STRETCH YOUR ESOPHAGUS IN ONE MONTH.       UPPER ENDOSCOPY AFTER CARE Read the instructions outlined below and refer to this sheet in the next week. These discharge instructions provide you with general information on caring for yourself after you leave the hospital. While your treatment has been planned according to the most current medical practices available, unavoidable complications occasionally occur. If you have any problems or questions after discharge, call DR. Kota Ciancio, [phone].  ACTIVITY  You may resume your regular activity, but move at a slower pace for the next 24 hours.   Take frequent rest periods for the next 24 hours.   Walking will help get rid of the air and reduce the bloated feeling in your belly (abdomen).   No driving for 24 hours (because of the medicine (anesthesia) used during the test).   You may shower.   Do not sign any important legal documents or operate any machinery for 24 hours (because of the anesthesia used during the test).    NUTRITION  Drink plenty of fluids.   You may resume your normal diet as instructed by your doctor.   Begin with a light meal and progress to your normal diet. Heavy or fried foods are harder to digest and may make you feel sick to your stomach (nauseated).   Avoid alcoholic beverages for 24 hours or as instructed.    MEDICATIONS  You may resume your normal medications.   WHAT YOU CAN EXPECT TODAY  Some feelings of bloating in the abdomen.   Passage of more gas than usual.    IF YOU HAD A BIOPSY TAKEN DURING  THE UPPER ENDOSCOPY:  Eat a soft diet IF YOU HAVE NAUSEA, BLOATING, ABDOMINAL PAIN, OR VOMITING.    FINDING OUT THE RESULTS OF YOUR TEST Not all test results are available during your visit. DR. Oneida Alar WILL CALL YOU WITHIN 7 DAYS OF YOUR PROCEDUE WITH YOUR RESULTS. Do not assume everything is normal if you have not heard from DR. Alisa Stjames IN ONE WEEK, CALL HER OFFICE AT [phone].  SEEK IMMEDIATE MEDICAL ATTENTION AND CALL THE OFFICE: [phone] IF:  You have more than a spotting of blood in your stool.   Your belly is swollen (abdominal distention).   You are nauseated or vomiting.   You have a temperature over 101F.   You have abdominal pain or discomfort that is severe or gets worse throughout the day.  ESOPHAGEAL STRICTURE  Esophageal strictures can be caused by stomach acid backing up into the tube that carries food from the mouth down to the stomach (lower esophagus).  TREATMENT There are a number of medicines used to treat reflux/stricture, including: Antacids.  Proton-pump inhibitors: PRILOSEC  HOME CARE INSTRUCTIONS Eat 2-3 hours before going to bed.  Try to reach and maintain a healthy weight.  Do not eat just a few very large meals. Instead, eat 4 TO 6 smaller meals throughout the day.  Try to identify foods and beverages that m ake your symptoms worse, and avoid these.  Avoid tight clothing.  Do not exercise right after eating.

## 2016-01-29 NOTE — Interval H&P Note (Signed)
History and Physical Interval Note:  01/29/2016 2:04 PM  Tina Kline  has presented today for surgery, with the diagnosis of Dysphagia  The various methods of treatment have been discussed with the patient and family. After consideration of risks, benefits and other options for treatment, the patient has consented to  Procedure(s) with comments: ESOPHAGOGASTRODUODENOSCOPY (EGD) (N/A) - 2:15 SAVORY DILATION (N/A) as a surgical intervention .  The patient's history has been reviewed, patient examined, no change in status, stable for surgery.  I have reviewed the patient's chart and labs.  Questions were answered to the patient's satisfaction.     [employer]

## 2016-01-29 NOTE — H&P (View-Only) (Signed)
Subjective:     Patient ID: Tina Kline, female   DOB: 03/28/1929, 82 y.o.   MRN: [id number]  [name], MD  HPI Losing weight. Diet modification. Won't swallow pills. WAS EATING 2 PANCAKES, 2 EGGS, TOAST, AND COFFEE. NOW TROUBLE WITH SOLID FOODS. CHANGED ALL HER EATING HABITS. BMS: GOOD. APPETITE: GOOD. WEIGHT: MAY 2017-148 LBS AND NOW 134 LBS.  PT DENIES FEVER, CHILLS, HEMATOCHEZIA, HEMATEMESIS, nausea, vomiting, melena, diarrhea, CHEST PAIN, SHORTNESS OF BREATH,  CHANGE IN BOWEL IN HABITS, constipation, abdominal pain,  problems with sedation, OR heartburn or indigestion.  Past Medical History:  Diagnosis Date  . Hypertension     Past Surgical History:  Procedure Laterality Date  . APPENDECTOMY    . COLONOSCOPY N/A 06/26/2015   Procedure: COLONOSCOPY;  Surgeon: Danie Binder, MD;  Location: [facility];  Service: Endoscopy;  Laterality: N/A;  215 - moved to 2:00 - office to notify  . MOUTH SURGERY      Current Outpatient Prescriptions  Medication Sig Dispense Refill  . acetaminophen (TYLENOL) 500 MG tablet Take 500 mg by mouth every morning.    Marland Kitchen amLODipine (NORVASC) 10 MG tablet Take 10 mg by mouth daily.    . ferrous sulfate 325 (65 FE) MG tablet Take 325 mg by mouth daily with breakfast.    . lisinopril (PRINIVIL,ZESTRIL) 20 MG tablet Take 20 mg by mouth daily.     . naproxen sodium (ANAPROX) 220 MG tablet Take 220 mg by mouth daily.    Marland Kitchen omeprazole (PRILOSEC) 20 MG capsule 1 po bid 30 minutes before meals for 3 mos then once daily FOREVER     Family History  Problem Relation Age of Onset  . Colon cancer Brother     ?  Marland Kitchen Breast cancer Daughter   . Pancreatic cancer Son     Social History   Social History  . Marital status: Widowed    Spouse name: N/A  . Number of children: 6  . Years of education: N/A   Social History Main Topics  . Smoking status: Never Smoker  . Smokeless tobacco: None  . Alcohol use No  . Drug use: No  . Sexual activity: Not  Asked   Review of Systems PER HPI OTHERWISE ALL SYSTEMS ARE NEGATIVE.    Objective:   Physical Exam  Constitutional: She is oriented to person, place, and time. She appears well-developed and well-nourished. No distress.  HENT:  Head: Normocephalic and atraumatic.  Mouth/Throat: Oropharynx is clear and moist. No oropharyngeal exudate.  STRABISMUS, UPPER AND LOWER DENTURES  Eyes: Pupils are equal, round, and reactive to light. No scleral icterus.  Neck: Normal range of motion. Neck supple.  Cardiovascular: Normal rate, regular rhythm and normal heart sounds.   Pulmonary/Chest: Effort normal and breath sounds normal. No respiratory distress.  Abdominal: Soft. Bowel sounds are normal. She exhibits no distension. There is no tenderness.  Musculoskeletal: She exhibits no edema.  Lymphadenopathy:    She has no cervical adenopathy.  Neurological: She is alert and oriented to person, place, and time.  NO  NEW FOCAL DEFICITS  Psychiatric:  FLAT AFFECT, NL MOOD  Vitals reviewed.     Assessment:         Plan:

## 2016-01-29 NOTE — Op Note (Signed)
[facility] Patient Name: Tina Kline Procedure Date: 01/29/2016 1:54 PM MRN: [id number] Date of Birth: 14-Apr-1929 Attending MD: Barney Drain , MD CSN: [id number] Age: 82 Admit Type: Outpatient Procedure:                Upper GI endoscopy WITH ESOPHAGEAL DILATION Indications:              Dysphagia Providers:                Barney Drain, MD, Rosina Lowenstein, RN, Isabella Stalling,                            Technician Referring MD:             [name] Medicines:                Meperidine 100 mg IV, Midazolam 5 mg IV Complications:            No immediate complications. Estimated Blood Loss:     Estimated blood loss was minimal. Procedure:                Pre-Anesthesia Assessment:                           - Prior to the procedure, a History and Physical                            was performed, and patient medications and                            allergies were reviewed. The patient's tolerance of                            previous anesthesia was also reviewed. The risks                            and benefits of the procedure and the sedation                            options and risks were discussed with the patient.                            All questions were answered, and informed consent                            was obtained. Prior Anticoagulants: The patient has                            taken no previous anticoagulant or antiplatelet                            agents. ASA Grade Assessment: II - A patient with                            mild systemic disease. After reviewing the risks  and benefits, the patient was deemed in                            satisfactory condition to undergo the procedure.                            After obtaining informed consent, the endoscope was                            passed under direct vision. Throughout the                            procedure, the patient's blood pressure, pulse, and                  oxygen saturations were monitored continuously. The                            [id number]) scope was introduced through the                            mouth, and advanced to the second part of duodenum.                            The [id number] scope was introduced through                            the and advanced to the. The upper GI endoscopy was                            performed with moderate difficulty due to abnormal                            anatomy. Successful completion of the procedure was                            aided by withdrawing the scope and replacing with                            the pediatric endoscope. The patient tolerated the                            procedure fairly well. Scope In: 2:26:16 PM Scope Out: 2:32:31 PM Total Procedure Duration: 0 hours 6 minutes 15 seconds  Findings:      A web was found in the proximal esophagus. A guidewire was placed and       the scope was withdrawn. Dilation was performed with a Savary dilator       with moderate resistance at 11 mm, 12 mm, 12.8 mm and 14 mm.      One mild (non-circumferential scarring) benign-appearing, intrinsic       stenosis was found. And was traversed. A guidewire was placed and the       scope was withdrawn. Dilation was performed with a Savary dilator with       mild resistance at  11 mm, 12 mm, 12.8 mm and 14 mm.      A large hiatal hernia was present.      Patchy moderate inflammation characterized by congestion (edema) and       erythema was found in the gastric antrum.      The examined duodenum was normal. Impression:               - Web in the proximal esophagus.                           - Benign-appearing esophageal stenosis.                           - Large hiatal hernia.                           - MODERATE Gastritis. Moderate Sedation:      Moderate (conscious) sedation was administered by the endoscopy nurse       and supervised by the endoscopist. The  following parameters were       monitored: oxygen saturation, heart rate, blood pressure, and response       to care. Total physician intraservice time was 29 minutes. Recommendation:           - Soft diet.                           - Continue present medications.                           - Repeat upper endoscopy in 1 month for retreatment.                           - Return to GI office in 4 months.                           - Patient has a contact number available for                            emergencies. The signs and symptoms of potential                            delayed complications were discussed with the                            patient. Return to normal activities tomorrow.                            Written discharge instructions were provided to the                            patient. Procedure Code(s):        --- Professional ---                           [phone], Esophagogastroduodenoscopy, flexible,  transoral; with insertion of guide wire followed by                            passage of dilator(s) through esophagus over guide                            wire                           99152, Moderate sedation services provided by the                            same physician or other qualified health care                            professional performing the diagnostic or                            therapeutic service that the sedation supports,                            requiring the presence of an independent trained                            observer to assist in the monitoring of the                            patient's level of consciousness and physiological                            status; initial 15 minutes of intraservice time,                            patient age 32 years or older                           [phone], Moderate sedation services; each additional                            15 minutes intraservice time Diagnosis  Code(s):        --- Professional ---                           Q39.4, Esophageal web                           K22.2, Esophageal obstruction                           K44.9, Diaphragmatic hernia without obstruction or                            gangrene                           K29.70, Gastritis, unspecified, without bleeding  R13.10, Dysphagia, unspecified CPT copyright 2016 American Medical Association. All rights reserved. The codes documented in this report are preliminary and upon coder review may  be revised to meet current compliance requirements. Barney Drain, MD Barney Drain, MD 01/29/2016 3:07:56 PM This report has been signed electronically. Number of Addenda: 0

## 2016-02-03 ENCOUNTER — Encounter (HOSPITAL_COMMUNITY): Payer: Self-pay | Admitting: Gastroenterology

## 2016-03-01 ENCOUNTER — Telehealth: Payer: Self-pay | Admitting: Gastroenterology

## 2016-03-01 NOTE — Telephone Encounter (Signed)
SEE NOTES DEC 2017.

## 2016-03-01 NOTE — Telephone Encounter (Signed)
Pt had procedure on 01/29/2016 and needs to repeat upper endoscopy in one month. Her daughter [name] called to schedule. Please call [phone]

## 2016-03-01 NOTE — Telephone Encounter (Signed)
Dr. Oneida Alar, does patient need a dilation in addition to EGD? Also please advise diagnosis.

## 2016-03-02 ENCOUNTER — Other Ambulatory Visit: Payer: Self-pay

## 2016-03-02 DIAGNOSIS — R1310 Dysphagia, unspecified: Secondary | ICD-10-CM

## 2016-03-02 NOTE — Telephone Encounter (Signed)
Pt is set up for EGD/ED on 04/01/16. Instructions are going out in the mail and her daughter is aware

## 2016-03-03 ENCOUNTER — Telehealth: Payer: Self-pay

## 2016-03-03 NOTE — Telephone Encounter (Signed)
Daughter called and requested later procedure time for EGD that is scheduled for 04/01/16. Time changed to 12:00pm. Called Endo scheduler and informed.

## 2016-03-03 NOTE — Telephone Encounter (Signed)
Opened in error

## 2016-03-08 ENCOUNTER — Ambulatory Visit (HOSPITAL_COMMUNITY)
Admission: RE | Admit: 2016-03-08 | Discharge: 2016-03-08 | Disposition: A | Discharge status: Home Health | Payer: Medicare Other | Source: Ambulatory Visit | Attending: Gastroenterology | Admitting: Gastroenterology

## 2016-03-08 ENCOUNTER — Encounter (HOSPITAL_COMMUNITY)
Admission: RE | Disposition: A | Discharge status: Home Health | Payer: Self-pay | Source: Ambulatory Visit | Attending: Gastroenterology

## 2016-03-08 ENCOUNTER — Encounter (HOSPITAL_COMMUNITY): Payer: Self-pay | Admitting: *Deleted

## 2016-03-08 DIAGNOSIS — K222 Esophageal obstruction: Secondary | ICD-10-CM

## 2016-03-08 DIAGNOSIS — Z803 Family history of malignant neoplasm of breast: Secondary | ICD-10-CM | POA: Diagnosis not present

## 2016-03-08 DIAGNOSIS — Q394 Esophageal web: Secondary | ICD-10-CM | POA: Diagnosis not present

## 2016-03-08 DIAGNOSIS — Z800 Family history of malignant neoplasm of digestive organs: Secondary | ICD-10-CM | POA: Insufficient documentation

## 2016-03-08 DIAGNOSIS — I10 Essential (primary) hypertension: Secondary | ICD-10-CM | POA: Insufficient documentation

## 2016-03-08 DIAGNOSIS — K2970 Gastritis, unspecified, without bleeding: Secondary | ICD-10-CM | POA: Diagnosis not present

## 2016-03-08 DIAGNOSIS — K449 Diaphragmatic hernia without obstruction or gangrene: Secondary | ICD-10-CM | POA: Diagnosis not present

## 2016-03-08 DIAGNOSIS — Z79899 Other long term (current) drug therapy: Secondary | ICD-10-CM | POA: Diagnosis not present

## 2016-03-08 DIAGNOSIS — R1310 Dysphagia, unspecified: Secondary | ICD-10-CM | POA: Diagnosis not present

## 2016-03-08 HISTORY — PX: SAVORY DILATION: SHX5439

## 2016-03-08 HISTORY — PX: ESOPHAGOGASTRODUODENOSCOPY: SHX5428

## 2016-03-08 SURGERY — EGD (ESOPHAGOGASTRODUODENOSCOPY)
Anesthesia: Moderate Sedation

## 2016-03-08 MED ORDER — MIDAZOLAM HCL 5 MG/5ML IJ SOLN
INTRAMUSCULAR | Status: AC
  Filled 2016-03-08: qty 10

## 2016-03-08 MED ORDER — MINERAL OIL PO OIL
TOPICAL_OIL | ORAL | Status: AC
  Filled 2016-03-08: qty 30

## 2016-03-08 MED ORDER — FENTANYL CITRATE (PF) 100 MCG/2ML IJ SOLN
INTRAMUSCULAR | Status: AC
  Filled 2016-03-08: qty 4

## 2016-03-08 MED ORDER — OMEPRAZOLE 20 MG PO CPDR: DELAYED_RELEASE_CAPSULE | ORAL | 11 refills | Status: DC

## 2016-03-08 MED ORDER — FENTANYL CITRATE (PF) 100 MCG/2ML IJ SOLN
INTRAMUSCULAR | Status: DC | PRN
  Administered 2016-03-08 (×2): 25 ug via INTRAVENOUS
  Administered 2016-03-08: 50 ug via INTRAVENOUS

## 2016-03-08 MED ORDER — STERILE WATER FOR IRRIGATION IR SOLN
Status: DC | PRN
  Administered 2016-03-08: 12:00:00

## 2016-03-08 MED ORDER — LIDOCAINE VISCOUS 2 % MT SOLN
OROMUCOSAL | Status: AC
  Filled 2016-03-08: qty 15

## 2016-03-08 MED ORDER — SODIUM CHLORIDE 0.9 % IV SOLN
INTRAVENOUS | Status: DC
  Administered 2016-03-08: 10:00:00 via INTRAVENOUS

## 2016-03-08 MED ORDER — MIDAZOLAM HCL 5 MG/5ML IJ SOLN
INTRAMUSCULAR | Status: DC | PRN
  Administered 2016-03-08 (×2): 1 mg via INTRAVENOUS
  Administered 2016-03-08: 2 mg via INTRAVENOUS
  Administered 2016-03-08: 1 mg via INTRAVENOUS
  Administered 2016-03-08: 2 mg via INTRAVENOUS

## 2016-03-08 MED ORDER — LIDOCAINE VISCOUS 2 % MT SOLN
OROMUCOSAL | Status: DC | PRN
  Administered 2016-03-08: 3 mL via OROMUCOSAL

## 2016-03-08 NOTE — Op Note (Addendum)
[facility] Patient Name: Tina Kline Procedure Date: 03/08/2016 11:35 AM MRN: [id number] Date of Birth: 1929-05-28 Attending MD: Barney Drain , MD CSN: [id number] Age: 83 Admit Type: Outpatient Procedure:                Upper GI endoscopy WITH ESOPHAGEAL DILATION Indications:              Dysphagia Providers:                Barney Drain, MD, Lurline Del, RN, Jeanann Lewandowsky. Sharon Seller, RN, Purcell Nails. Howard, Merchant navy officer Referring MD:             [name] Medicines:                Fentanyl 100 micrograms IV, Midazolam 7 mg IV Complications:            No immediate complications. Estimated Blood Loss:     Estimated blood loss was minimal. Procedure:                Pre-Anesthesia Assessment:                           - Prior to the procedure, a History and Physical                            was performed, and patient medications and                            allergies were reviewed. The patient's tolerance of                            previous anesthesia was also reviewed. The risks                            and benefits of the procedure and the sedation                            options and risks were discussed with the patient.                            All questions were answered, and informed consent                            was obtained. Prior Anticoagulants: The patient has                            taken no previous anticoagulant or antiplatelet                            agents. ASA Grade Assessment: II - A patient with                            mild systemic disease. After reviewing the risks  and benefits, the patient was deemed in                            satisfactory condition to undergo the procedure.                            After obtaining informed consent, the endoscope was                            passed under direct vision. Throughout the                            procedure, the patient's blood  pressure, pulse, and                            oxygen saturations were monitored continuously. The                            [id number]) scope was introduced through the                            mouth, and advanced to the second part of duodenum.                            The upper GI endoscopy was technically difficult                            and complex due to narrowing. Successful completion                            of the procedure was aided by increasing the dose                            of sedation medication and withdrawing the scope                            and replacing with the pediatric endoscope. The                            patient tolerated the procedure fairly well. Scope In: 11:54:05 AM Scope Out: 12:17:19 PM Total Procedure Duration: 0 hours 23 minutes 14 seconds  Findings:      A web was found in the proximal esophagus. A guidewire was placed and       the scope was withdrawn. Dilation was performed with a Savary dilator       with mild resistance at 12 mm, 12.8 mm, 14 mm, 15 mm and 16 mm.       Estimated blood loss was minimal.      A medium-sized hiatal hernia was present.      Patchy mildly erythematous mucosa without bleeding was found in the       cardia, in the gastric fundus and in the gastric body.      The gastric antrum was normal.      The examined duodenum was normal. Impression:               -  Web in the proximal esophagus. Dilated.                           - Medium-sized hiatal hernia.                           - MILD GASTRITIS in the cardia, gastric fundus and                            gastric body. Moderate Sedation:      Moderate (conscious) sedation was administered by the endoscopy nurse       and supervised by the endoscopist. The following parameters were       monitored: oxygen saturation, heart rate, blood pressure, and response       to care. Total physician intraservice time was 35 minutes. Recommendation:            - Soft diet.                           - Continue present medications.                           - Repeat EGD/DIL WILL BE CONSIDERED Q1-3 MOS IF                            DYSPHAGIA IS NOT IMPROVED IN ATTEMPT TO COMPLETELY                            DISRUPT THE WEB AND HOPEFULLY AVOID HIATAL HERNIA                            SURGERY.                           - Return to GI office in 3 months.                           - Patient has a contact number available for                            emergencies. The signs and symptoms of potential                            delayed complications were discussed with the                            patient. Return to normal activities tomorrow.                            Written discharge instructions were provided to the                            patient. Procedure Code(s):        --- Professional ---  [phone], Esophagogastroduodenoscopy, flexible,                            transoral; with insertion of guide wire followed by                            passage of dilator(s) through esophagus over guide                            wire                           99152, Moderate sedation services provided by the                            same physician or other qualified health care                            professional performing the diagnostic or                            therapeutic service that the sedation supports,                            requiring the presence of an independent trained                            observer to assist in the monitoring of the                            patient's level of consciousness and physiological                            status; initial 15 minutes of intraservice time,                            patient age 59 years or older                           [phone], Moderate sedation services; each additional                            15 minutes intraservice time Diagnosis Code(s):         --- Professional ---                           Q39.4, Esophageal web                           K44.9, Diaphragmatic hernia without obstruction or                            gangrene                           K31.89, Other diseases of stomach and duodenum  R13.10, Dysphagia, unspecified CPT copyright 2016 American Medical Association. All rights reserved. The codes documented in this report are preliminary and upon coder review may  be revised to meet current compliance requirements. Barney Drain, MD Barney Drain, MD 03/08/2016 12:32:54 PM This report has been signed electronically. Number of Addenda: 0

## 2016-03-08 NOTE — H&P (Addendum)
Primary Care Physician:  [name], MD Primary Gastroenterologist:  Dr. Oneida Alar  Pre-Procedure History & Physical: HPI:  Tina Kline is a 83 y.o. female here for [place].  Past Medical History:  Diagnosis Date  . Hypertension     Past Surgical History:  Procedure Laterality Date  . APPENDECTOMY    . COLONOSCOPY N/A 06/26/2015   Procedure: COLONOSCOPY;  Surgeon: Danie Binder, MD;  Location: [facility];  Service: Endoscopy;  Laterality: N/A;  215 - moved to 2:00 - office to notify  . ESOPHAGOGASTRODUODENOSCOPY N/A 01/29/2016   Procedure: ESOPHAGOGASTRODUODENOSCOPY (EGD);  Surgeon: Danie Binder, MD;  Location: [facility];  Service: Endoscopy;  Laterality: N/A;  2:15  . MOUTH SURGERY    . SAVORY DILATION N/A 01/29/2016   Procedure: SAVORY DILATION;  Surgeon: Danie Binder, MD;  Location: [facility];  Service: Endoscopy;  Laterality: N/A;    Prior to Admission medications   Medication Sig Start Date End Date Taking? Authorizing Provider  acetaminophen (TYLENOL) 500 MG tablet Take 500 mg by mouth every 8 (eight) hours as needed for mild pain.    Yes Historical Provider, MD  ferrous sulfate 325 (65 FE) MG tablet Take 325 mg by mouth daily.   Yes Historical Provider, MD  hydrochlorothiazide (HYDRODIURIL) 25 MG tablet Take 25 mg by mouth daily.   Yes Historical Provider, MD  lisinopril (PRINIVIL,ZESTRIL) 40 MG tablet Take 40 mg by mouth daily.   Yes Historical Provider, MD  omeprazole (PRILOSEC) 20 MG capsule 1 po bid 30 minutes before meals for 3 mos then once daily FOREVER Patient taking differently: Take 20 mg by mouth 2 (two) times daily with a meal.  06/26/15  Yes Danie Binder, MD  spironolactone (ALDACTONE) 25 MG tablet Take 25 mg by mouth daily.   Yes Historical Provider, MD  Vitamin D, Ergocalciferol, (DRISDOL) 50000 units CAPS capsule Take 50,000 Units by mouth every 30 (thirty) days.    Historical Provider, MD    Allergies as of 03/02/2016  . (No Known  Allergies)    Family History  Problem Relation Age of Onset  . Colon cancer Brother     ?  Marland Kitchen Breast cancer Daughter   . Pancreatic cancer Son     Social History   Social History  . Marital status: Widowed    Spouse name: N/A  . Number of children: 6  . Years of education: N/A   Occupational History  . Not on file.   Social History Main Topics  . Smoking status: Never Smoker  . Smokeless tobacco: Never Used  . Alcohol use No  . Drug use: No  . Sexual activity: Not on file   Other Topics Concern  . Not on file   Social History Narrative  . No narrative on file    Review of Systems: See HPI, otherwise negative ROS   Physical Exam: BP 130/79   Pulse 100   Temp 98.5 F (36.9 C) (Oral)   Resp 14   Ht 5\' 5"  (1.651 m)   Wt 137 lb (62.1 kg)   SpO2 100%   BMI 22.80 kg/m  General:   Alert,  pleasant and cooperative in NAD Head:  Normocephalic and atraumatic. Neck:  Supple; Lungs:  Clear throughout to auscultation.    Heart:  Regular rate and rhythm. Abdomen:  Soft, nontender and nondistended. Normal bowel sounds, without guarding, and without rebound.   Neurologic:  Alert and  oriented x4;  grossly normal neurologically.  Impression/Plan:     DYSPHAGIA  PLAN:  EGD/DIL TODAY. DISCUSSED PROCEDURE, BENEFITS, & RISKS: < 1% chance of medication reaction, bleeding, or perforation.

## 2016-03-08 NOTE — Discharge Instructions (Signed)
I dilated your esophagus. 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  I was unable to pass my adult scope SO I CHANGED TO A PEDIATRIC SCOPE BUT THE DILATORS PASSED WITHOUT MUCH RESISTANCE. SHE HAS mild gastritis.   PLEASE PLEASE CALL IN ONE MONTH IF SHE IS STILL HAVING PROBLEMS SWALLOWING. SHE MAY NEED A REPEAT UPPER ENDOSCOPY WITH DILATION TO COMPLETELY DISRUPT THE STRICTURE.   FOLLOW A SOFT MECHANICAL DIET.  MEATS SHOULD BE CHOPPED, PULLED, OR GROUND ONLY. DO NOT EAT CHUNKS OF ANYTHING. VEGETABLE SHOULD SOFT LIKE MASHED POTATOES, RICE, OR PASTA.   TO HELP CONTROL HER REFLUX and to TRY TO PREVENT A stricture,  SHE SHOULD:   1. SIT STRAIGHT UP FOR ONE HOUR AFTER EATING. HER HEAD NEEDS TO BE ABOVE HER HEART.    2. Eat 4 TO 6 MEALS A DAY.    3.  CONTINUE OMEPRAZOLE.  TAKE 30 MINUTES PRIOR TO YOUR MEALS TWICE DAILY.    4. Eliminate heartburn triggers. Everyone has specific triggers. AVOID Common triggers such as fatty or fried foods, spicy food, tomato sauce, carbonated beverages, alcohol, chocolate, mint, garlic, onion, caffeine and nicotine may make heartburn worse.    5. Don't lie down after a meal. Wait at least three to four hours after eating before going to bed, and don't lie down right after eating.   FOLLOW UP IN 3 MOS.   UPPER ENDOSCOPY AFTER CARE Read the instructions outlined below and refer to this sheet in the next week. These discharge instructions provide you with general information on caring for yourself after you leave the hospital. While your treatment has been planned according to the most current medical practices available, unavoidable complications occasionally occur. If you have any problems or questions after discharge, call DR. Daily Doe, [phone].  ACTIVITY  You may resume your regular activity, but move at a slower pace for the next 24 hours.   Take frequent rest periods for the next 24 hours.   Walking will help get rid of the air and  reduce the bloated feeling in your belly (abdomen).   No driving for 24 hours (because of the medicine (anesthesia) used during the test).   You may shower.   Do not sign any important legal documents or operate any machinery for 24 hours (because of the anesthesia used during the test).    NUTRITION  Drink plenty of fluids.   You may resume your normal diet as instructed by your doctor.   Begin with a light meal and progress to your normal diet. Heavy or fried foods are harder to digest and may make you feel sick to your stomach (nauseated).   Avoid alcoholic beverages for 24 hours or as instructed.    MEDICATIONS  You may resume your normal medications.   WHAT YOU CAN EXPECT TODAY  Some feelings of bloating in the abdomen.   Passage of more gas than usual.    IF YOU HAD A BIOPSY TAKEN DURING THE UPPER ENDOSCOPY:  Eat a soft diet IF YOU HAVE NAUSEA, BLOATING, ABDOMINAL PAIN, OR VOMITING.    FINDING OUT THE RESULTS OF YOUR TEST Not all test results are available during your visit. DR. Oneida Alar WILL CALL YOU WITHIN 14 DAYS OF YOUR PROCEDUE WITH YOUR RESULTS. Do not assume everything is normal if you have not heard from DR. Shandale Malak, CALL HER OFFICE AT [phone].  SEEK IMMEDIATE MEDICAL ATTENTION AND CALL THE OFFICE: [phone] IF:  You have more than a spotting of  blood in your stool.   Your belly is swollen (abdominal distention).   You are nauseated or vomiting.   You have a temperature over 101F.   You have abdominal pain or discomfort that is severe or gets worse throughout the day.  Gastritis  Gastritis is an inflammation (the body's way of reacting to injury and/or infection) of the stomach. It is often caused by viral or bacterial (germ) infections. It can also be caused BY ASPIRIN, BC/GOODY POWDER'S, (IBUPROFEN) MOTRIN, OR ALEVE (NAPROXEN), chemicals (including alcohol), SPICY FOODS, and medications. This illness may be associated with generalized malaise  (feeling tired, not well), UPPER ABDOMINAL STOMACH cramps, and fever. One common bacterial cause of gastritis is an organism known as H. Pylori. This can be treated with antibiotics.   ESOPHAGEAL STRICTURE  Esophageal strictures can be caused by stomach acid backing up into the tube that carries food from the mouth down to the stomach (lower esophagus).  TREATMENT There are a number of medicines used to treat reflux/stricture, including: Antacids.  Proton-pump inhibitors: OMEPRAZOLE  HOME CARE INSTRUCTIONS Eat 2-3 hours before going to bed.  Try to reach and maintain a healthy weight.  Do not eat just a few very large meals. Instead, eat 4 TO 6 smaller meals throughout the day.  Try to identify foods and beverages that make your symptoms worse, and avoid these.  Avoid tight clothing.  Do not exercise right after eating.

## 2016-03-10 ENCOUNTER — Encounter (HOSPITAL_COMMUNITY): Payer: Self-pay | Admitting: Gastroenterology

## 2016-04-05 DIAGNOSIS — R1310 Dysphagia, unspecified: Secondary | ICD-10-CM | POA: Diagnosis not present

## 2016-04-05 DIAGNOSIS — N183 Chronic kidney disease, stage 3 (moderate): Secondary | ICD-10-CM | POA: Diagnosis not present

## 2016-04-05 DIAGNOSIS — I10 Essential (primary) hypertension: Secondary | ICD-10-CM | POA: Diagnosis not present

## 2016-04-05 DIAGNOSIS — R634 Abnormal weight loss: Secondary | ICD-10-CM | POA: Diagnosis not present

## 2016-04-12 ENCOUNTER — Encounter: Payer: Self-pay | Admitting: Gastroenterology

## 2016-04-12 ENCOUNTER — Other Ambulatory Visit (HOSPITAL_COMMUNITY): Payer: Self-pay | Admitting: Internal Medicine

## 2016-04-12 DIAGNOSIS — R945 Abnormal results of liver function studies: Principal | ICD-10-CM

## 2016-04-12 DIAGNOSIS — N183 Chronic kidney disease, stage 3 unspecified: Secondary | ICD-10-CM

## 2016-04-12 DIAGNOSIS — R7989 Other specified abnormal findings of blood chemistry: Secondary | ICD-10-CM

## 2016-04-15 ENCOUNTER — Ambulatory Visit (HOSPITAL_COMMUNITY): Admission: RE | Admit: 2016-04-15 | Payer: Medicare Other | Source: Ambulatory Visit

## 2016-05-10 DIAGNOSIS — R739 Hyperglycemia, unspecified: Secondary | ICD-10-CM | POA: Diagnosis not present

## 2016-05-10 DIAGNOSIS — R1319 Other dysphagia: Secondary | ICD-10-CM | POA: Diagnosis not present

## 2016-05-10 DIAGNOSIS — E559 Vitamin D deficiency, unspecified: Secondary | ICD-10-CM | POA: Diagnosis not present

## 2016-05-10 DIAGNOSIS — I10 Essential (primary) hypertension: Secondary | ICD-10-CM | POA: Diagnosis not present

## 2016-05-10 DIAGNOSIS — N183 Chronic kidney disease, stage 3 (moderate): Secondary | ICD-10-CM | POA: Diagnosis not present

## 2016-05-10 DIAGNOSIS — G301 Alzheimer's disease with late onset: Secondary | ICD-10-CM | POA: Diagnosis not present

## 2016-05-10 DIAGNOSIS — R634 Abnormal weight loss: Secondary | ICD-10-CM | POA: Diagnosis not present

## 2016-05-10 DIAGNOSIS — E785 Hyperlipidemia, unspecified: Secondary | ICD-10-CM | POA: Diagnosis not present

## 2016-05-10 DIAGNOSIS — Z0000 Encounter for general adult medical examination without abnormal findings: Secondary | ICD-10-CM | POA: Diagnosis not present

## 2016-05-10 DIAGNOSIS — D649 Anemia, unspecified: Secondary | ICD-10-CM | POA: Diagnosis not present

## 2016-05-24 ENCOUNTER — Ambulatory Visit (HOSPITAL_COMMUNITY)
Admission: RE | Admit: 2016-05-24 | Discharge: 2016-05-24 | Disposition: A | Discharge status: Home / Self Care | Payer: Medicare Other | Source: Ambulatory Visit | Attending: Internal Medicine | Admitting: Internal Medicine

## 2016-05-24 DIAGNOSIS — N189 Chronic kidney disease, unspecified: Secondary | ICD-10-CM | POA: Insufficient documentation

## 2016-05-24 DIAGNOSIS — N183 Chronic kidney disease, stage 3 unspecified: Secondary | ICD-10-CM

## 2016-05-24 DIAGNOSIS — N179 Acute kidney failure, unspecified: Secondary | ICD-10-CM | POA: Diagnosis not present

## 2016-08-15 DIAGNOSIS — I10 Essential (primary) hypertension: Secondary | ICD-10-CM | POA: Diagnosis not present

## 2016-11-15 DIAGNOSIS — M1990 Unspecified osteoarthritis, unspecified site: Secondary | ICD-10-CM | POA: Diagnosis not present

## 2016-11-15 DIAGNOSIS — N183 Chronic kidney disease, stage 3 (moderate): Secondary | ICD-10-CM | POA: Diagnosis not present

## 2016-11-15 DIAGNOSIS — E785 Hyperlipidemia, unspecified: Secondary | ICD-10-CM | POA: Diagnosis not present

## 2016-11-15 DIAGNOSIS — R739 Hyperglycemia, unspecified: Secondary | ICD-10-CM | POA: Diagnosis not present

## 2016-11-15 DIAGNOSIS — E559 Vitamin D deficiency, unspecified: Secondary | ICD-10-CM | POA: Diagnosis not present

## 2016-11-15 DIAGNOSIS — Z1389 Encounter for screening for other disorder: Secondary | ICD-10-CM | POA: Diagnosis not present

## 2016-11-15 DIAGNOSIS — I10 Essential (primary) hypertension: Secondary | ICD-10-CM | POA: Diagnosis not present

## 2016-11-15 DIAGNOSIS — R634 Abnormal weight loss: Secondary | ICD-10-CM | POA: Diagnosis not present

## 2016-11-15 DIAGNOSIS — Z0000 Encounter for general adult medical examination without abnormal findings: Secondary | ICD-10-CM | POA: Diagnosis not present

## 2016-11-15 DIAGNOSIS — G309 Alzheimer's disease, unspecified: Secondary | ICD-10-CM | POA: Diagnosis not present

## 2016-11-15 DIAGNOSIS — D649 Anemia, unspecified: Secondary | ICD-10-CM | POA: Diagnosis not present

## 2017-03-07 DIAGNOSIS — I10 Essential (primary) hypertension: Secondary | ICD-10-CM | POA: Diagnosis not present

## 2017-03-07 DIAGNOSIS — N183 Chronic kidney disease, stage 3 (moderate): Secondary | ICD-10-CM | POA: Diagnosis not present

## 2017-03-07 DIAGNOSIS — G309 Alzheimer's disease, unspecified: Secondary | ICD-10-CM | POA: Diagnosis not present

## 2017-03-07 DIAGNOSIS — M1990 Unspecified osteoarthritis, unspecified site: Secondary | ICD-10-CM | POA: Diagnosis not present

## 2017-05-30 ENCOUNTER — Other Ambulatory Visit (HOSPITAL_COMMUNITY): Payer: Self-pay | Admitting: Internal Medicine

## 2017-05-30 DIAGNOSIS — R55 Syncope and collapse: Secondary | ICD-10-CM

## 2017-05-30 DIAGNOSIS — G309 Alzheimer's disease, unspecified: Secondary | ICD-10-CM | POA: Diagnosis not present

## 2017-05-30 DIAGNOSIS — I10 Essential (primary) hypertension: Secondary | ICD-10-CM | POA: Diagnosis not present

## 2017-05-30 DIAGNOSIS — N183 Chronic kidney disease, stage 3 (moderate): Secondary | ICD-10-CM | POA: Diagnosis not present

## 2017-06-01 ENCOUNTER — Encounter (HOSPITAL_COMMUNITY): Payer: Self-pay

## 2017-06-01 ENCOUNTER — Other Ambulatory Visit: Payer: Self-pay

## 2017-06-01 ENCOUNTER — Ambulatory Visit (HOSPITAL_COMMUNITY)
Admission: RE | Admit: 2017-06-01 | Discharge: 2017-06-01 | Disposition: A | Discharge status: Home / Self Care | Payer: Medicare Other | Source: Ambulatory Visit | Attending: Internal Medicine | Admitting: Internal Medicine

## 2017-06-01 DIAGNOSIS — R55 Syncope and collapse: Secondary | ICD-10-CM

## 2017-06-01 DIAGNOSIS — I6523 Occlusion and stenosis of bilateral carotid arteries: Secondary | ICD-10-CM | POA: Insufficient documentation

## 2017-06-02 ENCOUNTER — Encounter (HOSPITAL_COMMUNITY): Payer: Self-pay

## 2017-06-05 ENCOUNTER — Ambulatory Visit (HOSPITAL_COMMUNITY): Admission: RE | Admit: 2017-06-05 | Payer: Medicare Other | Source: Ambulatory Visit

## 2017-06-05 ENCOUNTER — Other Ambulatory Visit (HOSPITAL_COMMUNITY): Payer: Self-pay | Admitting: Internal Medicine

## 2017-06-05 DIAGNOSIS — R55 Syncope and collapse: Secondary | ICD-10-CM

## 2017-06-20 ENCOUNTER — Encounter (HOSPITAL_COMMUNITY): Payer: Self-pay | Admitting: Adult Health

## 2017-06-20 ENCOUNTER — Emergency Department (HOSPITAL_COMMUNITY): Payer: Medicare Other

## 2017-06-20 ENCOUNTER — Other Ambulatory Visit: Payer: Self-pay

## 2017-06-20 ENCOUNTER — Inpatient Hospital Stay (HOSPITAL_COMMUNITY)
Admission: EM | Admit: 2017-06-20 | Discharge: 2017-06-23 | DRG: 811 | Disposition: A | Discharge status: Home Health | Payer: Medicare Other | Attending: Internal Medicine | Admitting: Internal Medicine

## 2017-06-20 DIAGNOSIS — N289 Disorder of kidney and ureter, unspecified: Secondary | ICD-10-CM | POA: Diagnosis present

## 2017-06-20 DIAGNOSIS — K449 Diaphragmatic hernia without obstruction or gangrene: Secondary | ICD-10-CM | POA: Diagnosis present

## 2017-06-20 DIAGNOSIS — F0150 Vascular dementia without behavioral disturbance: Secondary | ICD-10-CM | POA: Diagnosis present

## 2017-06-20 DIAGNOSIS — B9689 Other specified bacterial agents as the cause of diseases classified elsewhere: Secondary | ICD-10-CM | POA: Diagnosis not present

## 2017-06-20 DIAGNOSIS — Z79899 Other long term (current) drug therapy: Secondary | ICD-10-CM

## 2017-06-20 DIAGNOSIS — R4182 Altered mental status, unspecified: Secondary | ICD-10-CM | POA: Diagnosis not present

## 2017-06-20 DIAGNOSIS — E861 Hypovolemia: Secondary | ICD-10-CM | POA: Diagnosis present

## 2017-06-20 DIAGNOSIS — Q394 Esophageal web: Secondary | ICD-10-CM | POA: Diagnosis not present

## 2017-06-20 DIAGNOSIS — D500 Iron deficiency anemia secondary to blood loss (chronic): Secondary | ICD-10-CM | POA: Diagnosis not present

## 2017-06-20 DIAGNOSIS — F0390 Unspecified dementia without behavioral disturbance: Secondary | ICD-10-CM | POA: Diagnosis not present

## 2017-06-20 DIAGNOSIS — J45909 Unspecified asthma, uncomplicated: Secondary | ICD-10-CM | POA: Diagnosis not present

## 2017-06-20 DIAGNOSIS — D649 Anemia, unspecified: Secondary | ICD-10-CM | POA: Diagnosis not present

## 2017-06-20 DIAGNOSIS — K921 Melena: Secondary | ICD-10-CM | POA: Diagnosis present

## 2017-06-20 DIAGNOSIS — K222 Esophageal obstruction: Secondary | ICD-10-CM | POA: Diagnosis present

## 2017-06-20 DIAGNOSIS — R55 Syncope and collapse: Secondary | ICD-10-CM | POA: Diagnosis not present

## 2017-06-20 DIAGNOSIS — D320 Benign neoplasm of cerebral meninges: Secondary | ICD-10-CM | POA: Diagnosis present

## 2017-06-20 DIAGNOSIS — K259 Gastric ulcer, unspecified as acute or chronic, without hemorrhage or perforation: Secondary | ICD-10-CM | POA: Diagnosis present

## 2017-06-20 DIAGNOSIS — R7989 Other specified abnormal findings of blood chemistry: Secondary | ICD-10-CM | POA: Diagnosis not present

## 2017-06-20 DIAGNOSIS — K3189 Other diseases of stomach and duodenum: Secondary | ICD-10-CM | POA: Diagnosis not present

## 2017-06-20 DIAGNOSIS — D509 Iron deficiency anemia, unspecified: Principal | ICD-10-CM | POA: Diagnosis present

## 2017-06-20 DIAGNOSIS — H5010 Unspecified exotropia: Secondary | ICD-10-CM | POA: Diagnosis present

## 2017-06-20 DIAGNOSIS — Z803 Family history of malignant neoplasm of breast: Secondary | ICD-10-CM | POA: Diagnosis not present

## 2017-06-20 DIAGNOSIS — K269 Duodenal ulcer, unspecified as acute or chronic, without hemorrhage or perforation: Secondary | ICD-10-CM | POA: Diagnosis present

## 2017-06-20 DIAGNOSIS — E871 Hypo-osmolality and hyponatremia: Secondary | ICD-10-CM | POA: Diagnosis present

## 2017-06-20 DIAGNOSIS — N183 Chronic kidney disease, stage 3 (moderate): Secondary | ICD-10-CM | POA: Diagnosis present

## 2017-06-20 DIAGNOSIS — R569 Unspecified convulsions: Secondary | ICD-10-CM | POA: Diagnosis not present

## 2017-06-20 DIAGNOSIS — R1310 Dysphagia, unspecified: Secondary | ICD-10-CM | POA: Diagnosis not present

## 2017-06-20 DIAGNOSIS — I340 Nonrheumatic mitral (valve) insufficiency: Secondary | ICD-10-CM | POA: Diagnosis not present

## 2017-06-20 DIAGNOSIS — I129 Hypertensive chronic kidney disease with stage 1 through stage 4 chronic kidney disease, or unspecified chronic kidney disease: Secondary | ICD-10-CM | POA: Diagnosis present

## 2017-06-20 DIAGNOSIS — Z800 Family history of malignant neoplasm of digestive organs: Secondary | ICD-10-CM

## 2017-06-20 DIAGNOSIS — J90 Pleural effusion, not elsewhere classified: Secondary | ICD-10-CM | POA: Diagnosis not present

## 2017-06-20 DIAGNOSIS — Z9289 Personal history of other medical treatment: Secondary | ICD-10-CM

## 2017-06-20 DIAGNOSIS — K2950 Unspecified chronic gastritis without bleeding: Secondary | ICD-10-CM | POA: Diagnosis not present

## 2017-06-20 HISTORY — DX: Anemia, unspecified: D64.9

## 2017-06-20 HISTORY — DX: Unspecified dementia, unspecified severity, without behavioral disturbance, psychotic disturbance, mood disturbance, and anxiety: F03.90

## 2017-06-20 LAB — URINALYSIS, ROUTINE W REFLEX MICROSCOPIC
Bacteria, UA: NONE SEEN
Bilirubin Urine: NEGATIVE
GLUCOSE, UA: NEGATIVE mg/dL
Hgb urine dipstick: NEGATIVE
KETONES UR: NEGATIVE mg/dL
Nitrite: NEGATIVE
PH: 5 (ref 5.0–8.0)
Protein, ur: NEGATIVE mg/dL
Specific Gravity, Urine: 1.009 (ref 1.005–1.030)

## 2017-06-20 LAB — CBC
HCT: 24.8 % — ABNORMAL LOW (ref 36.0–46.0)
Hemoglobin: 7.9 g/dL — ABNORMAL LOW (ref 12.0–15.0)
MCH: 27.7 pg (ref 26.0–34.0)
MCHC: 31.9 g/dL (ref 30.0–36.0)
MCV: 87 fL (ref 78.0–100.0)
PLATELETS: 378 10*3/uL (ref 150–400)
RBC: 2.85 MIL/uL — AB (ref 3.87–5.11)
RDW: 13.8 % (ref 11.5–15.5)
WBC: 4.3 10*3/uL (ref 4.0–10.5)

## 2017-06-20 LAB — BASIC METABOLIC PANEL
Anion gap: 7 (ref 5–15)
BUN: 17 mg/dL (ref 6–20)
CO2: 22 mmol/L (ref 22–32)
CREATININE: 1.67 mg/dL — AB (ref 0.44–1.00)
Calcium: 9.1 mg/dL (ref 8.9–10.3)
Chloride: 102 mmol/L (ref 101–111)
GFR, EST AFRICAN AMERICAN: 31 mL/min — AB (ref >60)
GFR, EST NON AFRICAN AMERICAN: 27 mL/min — AB (ref >60)
Glucose, Bld: 132 mg/dL — ABNORMAL HIGH (ref 65–99)
Potassium: 4.4 mmol/L (ref 3.5–5.1)
SODIUM: 131 mmol/L — AB (ref 135–145)

## 2017-06-20 LAB — POC OCCULT BLOOD, ED: Fecal Occult Bld: NEGATIVE

## 2017-06-20 LAB — TSH: TSH: 1.303 u[IU]/mL (ref 0.350–4.500)

## 2017-06-20 LAB — CBG MONITORING, ED: Glucose-Capillary: 161 mg/dL — ABNORMAL HIGH (ref 65–99)

## 2017-06-20 LAB — TROPONIN I: Troponin I: <0.03 ng/mL

## 2017-06-20 LAB — D-DIMER, QUANTITATIVE (NOT AT ARMC): D DIMER QUANT: 1.93 ug{FEU}/mL — AB (ref 0.00–0.50)

## 2017-06-20 MED ORDER — SODIUM CHLORIDE 0.9 % IV BOLUS
500.0000 mL | Freq: Once | INTRAVENOUS | Status: AC
  Administered 2017-06-20: 500 mL via INTRAVENOUS

## 2017-06-20 MED ORDER — ACETAMINOPHEN 325 MG PO TABS
650.0000 mg | ORAL_TABLET | Freq: Four times a day (QID) | ORAL | Status: DC | PRN
  Administered 2017-06-21: 650 mg via ORAL
  Filled 2017-06-20: qty 2

## 2017-06-20 MED ORDER — SPIRONOLACTONE 25 MG PO TABS
25.0000 mg | ORAL_TABLET | Freq: Every day | ORAL | Status: DC
  Administered 2017-06-20 – 2017-06-23 (×4): 25 mg via ORAL
  Filled 2017-06-20 (×4): qty 1

## 2017-06-20 MED ORDER — HYDROCHLOROTHIAZIDE 25 MG PO TABS
25.0000 mg | ORAL_TABLET | Freq: Every day | ORAL | Status: DC
  Administered 2017-06-21 – 2017-06-23 (×3): 25 mg via ORAL
  Filled 2017-06-20 (×3): qty 1

## 2017-06-20 MED ORDER — PANTOPRAZOLE SODIUM 40 MG PO TBEC
40.0000 mg | DELAYED_RELEASE_TABLET | Freq: Every day | ORAL | Status: DC
  Administered 2017-06-20 – 2017-06-21 (×2): 40 mg via ORAL
  Filled 2017-06-20 (×2): qty 1

## 2017-06-20 MED ORDER — DONEPEZIL HCL 5 MG PO TABS
10.0000 mg | ORAL_TABLET | Freq: Every day | ORAL | Status: DC
  Administered 2017-06-20 – 2017-06-22 (×3): 10 mg via ORAL
  Filled 2017-06-20 (×3): qty 2

## 2017-06-20 MED ORDER — ACETAMINOPHEN 650 MG RE SUPP: 650.0000 mg | Freq: Four times a day (QID) | RECTAL | Status: DC | PRN

## 2017-06-20 MED ORDER — LISINOPRIL 10 MG PO TABS
40.0000 mg | ORAL_TABLET | Freq: Every day | ORAL | Status: DC
  Administered 2017-06-20 – 2017-06-23 (×4): 40 mg via ORAL
  Filled 2017-06-20 (×4): qty 4

## 2017-06-20 MED ORDER — ENOXAPARIN SODIUM 40 MG/0.4ML ~~LOC~~ SOLN
40.0000 mg | SUBCUTANEOUS | Status: DC
  Administered 2017-06-20: 40 mg via SUBCUTANEOUS
  Filled 2017-06-20: qty 0.4

## 2017-06-20 MED ORDER — LEVETIRACETAM IN NACL 1000 MG/100ML IV SOLN
1000.0000 mg | Freq: Once | INTRAVENOUS | Status: AC
  Administered 2017-06-20: 1000 mg via INTRAVENOUS
  Filled 2017-06-20: qty 100

## 2017-06-20 MED ORDER — HYDRALAZINE HCL 25 MG PO TABS
25.0000 mg | ORAL_TABLET | Freq: Two times a day (BID) | ORAL | Status: DC
  Administered 2017-06-20 – 2017-06-23 (×6): 25 mg via ORAL
  Filled 2017-06-20 (×6): qty 1

## 2017-06-20 MED ORDER — SODIUM CHLORIDE 0.9 % IV SOLN
INTRAVENOUS | Status: AC
  Administered 2017-06-20: 21:00:00 via INTRAVENOUS

## 2017-06-20 NOTE — ED Triage Notes (Signed)
PResents with family post syncopal episode that occurred this morning after eating breakfast. Family states pt got up and took her plate to the sink then came back to sit down at table and sat down then "her eyes rolled back into her head, she wouldn't respond to me and her right arm was shaking, her mouth was wide open and she slid onto the floor for about 4 minutes she was not responding. We called EMS and by the time they arrived she was awake again"

## 2017-06-20 NOTE — H&P (Signed)
[facility] H&P   Patient Demographics:    Tina Kline, is a 84 y.o. female  MRN: [id number]   DOB - 05/19/1929  Admit Date - 06/20/2017  Outpatient Primary MD for the patient is [name], MD  Referring MD/NP/PA:  Nat Christen  Outpatient Specialists:  Barney Drain  Patient coming from:  home   Chief Complaint  Patient presents with  . Loss of Consciousness      HPI:    Tina Kline  is a 84 y.o. female, w hypertension, dementia, c/o syncope at the supper table for about 53minutes.  This was a witnessed event by family and her right arm was shaking. No tongue lac, no incontinence.   Pt denies pre syncope symptoms.  Denies cp, palp, sob, n/v, diarrhea, brbpr, black stool.    In Ed,  CT brain  IMPRESSION: Atrophy with extensive chronic microvascular ischemia. No acute infarct.  1 cm calcified extra-axial mass left parietal region with skull changes most consistent with meningioma.  Na 131, K 4.4, Bun 17, Creatinine 1.67  Urinalysis negative  Wbc 4.3, Hgb 7.9, Plt 378  FOBT negative  Pt will be admitted for syncope ? Seizure and worsening anemia.          Review of systems:    In addition to the HPI above,   No Fever-chills, No Headache, No changes with Vision or hearing, No problems swallowing food or Liquids, No Chest pain, Cough or Shortness of Breath, No Abdominal pain, No Nausea or Vommitting, Bowel movements are regular, No Blood in stool or Urine, No dysuria, No new skin rashes or bruises, No new joints pains-aches,  No new weakness, tingling, numbness in any extremity, No recent weight gain or loss, No polyuria, polydypsia or polyphagia, No significant Mental Stressors.  A full 10 point Review of Systems was done, except as stated above, all other Review of Systems were negative.   With Past History of the following :    Past Medical  History:  Diagnosis Date  . Anemia   . Dementia   . Hypertension       Past Surgical History:  Procedure Laterality Date  . APPENDECTOMY    . COLONOSCOPY N/A 06/26/2015   Procedure: COLONOSCOPY;  Surgeon: Danie Binder, MD;  [facility];  Service: Endoscopy;  Laterality: N/A;  215 - moved to 2:00 - office to notify  . ESOPHAGOGASTRODUODENOSCOPY N/A 01/29/2016   Procedure: ESOPHAGOGASTRODUODENOSCOPY (EGD);  Surgeon: Danie Binder, MD;  [facility];  Service: Endoscopy;  Laterality: N/A;  2:15  . ESOPHAGOGASTRODUODENOSCOPY N/A 03/08/2016   Procedure: ESOPHAGOGASTRODUODENOSCOPY (EGD);  Surgeon: Danie Binder, MD;  [facility];  Service: Endoscopy;  Laterality: N/A;  930 - moved to 1/30 @ 10:00 per Ginger  . MOUTH SURGERY    . SAVORY DILATION N/A 01/29/2016   Procedure: SAVORY DILATION;  Surgeon:  Danie Binder, MD;  [facility];  Service: Endoscopy;  Laterality: N/A;  . SAVORY DILATION N/A 03/08/2016   Procedure: SAVORY DILATION;  Surgeon: Danie Binder, MD;  [facility];  Service: Endoscopy;  Laterality: N/A;      Social History:     Social History   Tobacco Use  . Smoking status: Never Smoker  . Smokeless tobacco: Never Used  Substance Use Topics  . Alcohol use: No     Lives - at home with family  Mobility - walks by self  Family History :     Family History  Problem Relation Age of Onset  . Breast cancer Daughter   . Pancreatic cancer Son   . Colon cancer Son       Home Medications:   Prior to Admission medications   Medication Sig Start Date End Date Taking? Authorizing Provider  donepezil (ARICEPT) 10 MG tablet Take 10 mg by mouth at bedtime.   Yes [provider]  hydrALAZINE (APRESOLINE) 25 MG tablet Take 25 mg by mouth 3 (three) times daily.   Yes [provider]  acetaminophen (TYLENOL) 500 MG tablet Take 500 mg by mouth every 8 (eight) hours as needed for mild pain.      [provider]  ferrous sulfate 325 (65 FE) MG tablet Take 325 mg by mouth daily.    [provider]  hydrochlorothiazide (HYDRODIURIL) 25 MG tablet Take 25 mg by mouth daily.    [provider]  lisinopril (PRINIVIL,ZESTRIL) 40 MG tablet Take 40 mg by mouth daily.    [provider]  omeprazole (PRILOSEC) 20 MG capsule 1 po bid 30 minutes before meals 03/08/16   Fields, Sandi L, MD  spironolactone (ALDACTONE) 25 MG tablet Take 25 mg by mouth daily.    [provider]  Vitamin D, Ergocalciferol, (DRISDOL) 50000 units CAPS capsule Take 50,000 Units by mouth every 30 (thirty) days.    [provider]     Allergies:    No Known Allergies   Physical Exam:   Vitals  Blood pressure 139/60, pulse 75, temperature 98.4 F (36.9 C), temperature source Oral, resp. rate 15, weight 57.2 kg (126 lb), SpO2 100 %.   1. General  lying in bed in NAD,   2. Normal affect and insight, Not Suicidal or Homicidal, Awake Alert, Oriented X 3.  3. No F.N deficits, ALL C.Nerves Intact, Strength 5/5 all 4 extremities, Sensation intact all 4 extremities, Plantars down going.  4. Ears and Eyes appear Normal, Conjunctivae clear, PERRLA. Moist Oral Mucosa.  5. Supple Neck, No JVD, No cervical lymphadenopathy appriciated, No Carotid Bruits.  6. Symmetrical Chest wall movement, Good air movement bilaterally, CTAB.  7. RRR, No Gallops, Rubs or Murmurs, No Parasternal Heave.  8. Positive Bowel Sounds, Abdomen Soft, No tenderness, No organomegaly appriciated,No rebound -guarding or rigidity.  9.  No Cyanosis, Normal Skin Turgor, No Skin Rash or Bruise.  10. Good muscle tone,  joints appear normal , no effusions, Normal ROM.  11. No Palpable Lymph Nodes in Neck or Axillae     Data Review:    CBC Recent Labs  Lab 06/20/17 1513  WBC 4.3  HGB 7.9*  HCT 24.8*  PLT 378  MCV 87.0  MCH 27.7  MCHC 31.9  RDW 13.8    ------------------------------------------------------------------------------------------------------------------  Chemistries  Recent Labs  Lab 06/20/17 1513  NA 131*  K 4.4  CL 102  CO2 22  GLUCOSE 132*  BUN 17  CREATININE 1.67*  CALCIUM 9.1   ------------------------------------------------------------------------------------------------------------------ CrCl cannot be calculated (Unknown ideal weight.). ------------------------------------------------------------------------------------------------------------------ No results for input(s): TSH, T4TOTAL, T3FREE, THYROIDAB in the last 72 hours.  Invalid input(s): FREET3  Coagulation profile No results for input(s): INR, PROTIME in the last 168 hours. ------------------------------------------------------------------------------------------------------------------- No results for input(s): DDIMER in the last 72 hours. -------------------------------------------------------------------------------------------------------------------  Cardiac Enzymes No results for input(s): CKMB, TROPONINI, MYOGLOBIN in the last 168 hours.  Invalid input(s): CK ------------------------------------------------------------------------------------------------------------------ No results found for: BNP   ---------------------------------------------------------------------------------------------------------------  Urinalysis    Component Value Date/Time   COLORURINE YELLOW 06/20/2017 [place] 06/20/2017 1444   LABSPEC 1.009 06/20/2017 1444   PHURINE 5.0 06/20/2017 1444   GLUCOSEU NEGATIVE 06/20/2017 1444   HGBUR NEGATIVE 06/20/2017 1444   BILIRUBINUR NEGATIVE 06/20/2017 1444   KETONESUR NEGATIVE 06/20/2017 1444   PROTEINUR NEGATIVE 06/20/2017 1444   UROBILINOGEN 0.2 01/15/2013 0425   NITRITE NEGATIVE 06/20/2017 1444   LEUKOCYTESUR TRACE (A) 06/20/2017 1444     ----------------------------------------------------------------------------------------------------------------   Imaging Results:    Ct Head Wo Contrast  Result Date: 06/20/2017 CLINICAL DATA:  Recurrent syncope, dementia EXAM: CT HEAD WITHOUT CONTRAST TECHNIQUE: Contiguous axial images were obtained from the base of the skull through the vertex without intravenous contrast. COMPARISON:  None. FINDINGS: Brain: Moderate atrophy with extensive chronic microvascular ischemia throughout the white matter. Negative for acute infarct or hemorrhage 1 cm partially calcified extra-axial mass in the left parietal region with skull changes. There is some erosion of the parietal bone with sclerotic well-defined margins. No vasogenic edema in the adjacent brain. Findings most consistent with meningioma. Vascular: Negative for hyperdense vessel Skull: Chronic appearing bony changes with associated extra-axial calcified mass left parietal region. Negative for skull fracture Sinuses/Orbits: Negative Other: None IMPRESSION: Atrophy with extensive chronic microvascular ischemia. No acute infarct. 1 cm calcified extra-axial mass left parietal region with skull changes most consistent with meningioma. Electronically Signed   By: Franchot Gallo M.D.   On: 06/20/2017 17:26   nsr at 70, nl axis, nl int, early R progression.     Assessment & Plan:    Principal Problem:   Syncope Active Problems:   IDA (iron deficiency anemia)    Syncope  ? Seizure due to meningioma Tele Trop I q6h x3 D dimer, if positive then VQ scan Prior carotid ultrasound negative for stenosis 06/05/2017 Check cardiac echo Check MRI brain Check EEG keppra 1000mg  iv x1 Neurology consultation order placed  Mild ARF on CRF Hydrate with ns iv If creatinine not improving consider holding lisinopril Check cmp in am  Hyponatremia (? Due to hydrochlorothiazide/spironolactone) Hydrate with ns iv Check cmp in am Consider further w/up  with serum osm, cortisol, tsh, urine osm, urine sodium if hyponatremia persistent  Anemia Check ferritin, iron, tibc, b12, folate, tsh, myeloma panel Check cbc in am    DVT Prophylaxis  Lovenox - SCDs  AM Labs Ordered, also please review Full Orders  Family Communication: Admission, patients condition and plan of care including tests being ordered have been discussed with the patient who indicate understanding and agree with the plan and Code Status.  Code Stats FULL CODE  Likely DC to  home  Condition GUARDED    Consults called:  Neurology consult order in computer  Admission status: inpatient  Time spent in minutes : 45   Jani Gravel M.D on 06/20/2017 at 7:09 PM  Between 7am to 7pm - Pager - [phone]    After 7pm go  to [email or web address] - password [facility]  [facility] - Office  [phone]

## 2017-06-20 NOTE — ED Provider Notes (Signed)
[facility] EMERGENCY DEPARTMENT Provider Note   CSN: [id number] Arrival date & time: 06/20/17  1248     History   Chief Complaint Chief Complaint  Patient presents with  . Loss of Consciousness    HPI Tina Kline is a 84 y.o. female.  Level 5 caveat for mild to moderate dementia.  History obtained from daughter.  Patient was sitting this morning when her eyes rolled in the back of her head and she "passed out" for 3 to 4 minutes.  Her right hand was shaking at the time.  She has seen her primary care doctor for similar events recently.  She is feeling back to normal now.  No history of black stool, chest pain, dyspnea, prodromal illnesses.     Past Medical History:  Diagnosis Date  . Dementia   . Hypertension     Patient Active Problem List   Diagnosis Date Noted  . Dysphagia 01/20/2016  . IDA (iron deficiency anemia) 06/08/2015  . Chronic renal disease 06/08/2015  . Arthritis of knee, degenerative 06/13/2013    Past Surgical History:  Procedure Laterality Date  . APPENDECTOMY    . COLONOSCOPY N/A 06/26/2015   Procedure: COLONOSCOPY;  Surgeon: Danie Binder, MD;  [facility];  Service: Endoscopy;  Laterality: N/A;  215 - moved to 2:00 - office to notify  . ESOPHAGOGASTRODUODENOSCOPY N/A 01/29/2016   Procedure: ESOPHAGOGASTRODUODENOSCOPY (EGD);  Surgeon: Danie Binder, MD;  [facility];  Service: Endoscopy;  Laterality: N/A;  2:15  . ESOPHAGOGASTRODUODENOSCOPY N/A 03/08/2016   Procedure: ESOPHAGOGASTRODUODENOSCOPY (EGD);  Surgeon: Danie Binder, MD;  [facility];  Service: Endoscopy;  Laterality: N/A;  930 - moved to 1/30 @ 10:00 per Ginger  . MOUTH SURGERY    . SAVORY DILATION N/A 01/29/2016   Procedure: SAVORY DILATION;  Surgeon: Danie Binder, MD;  [facility];  Service: Endoscopy;  Laterality: N/A;  . SAVORY DILATION N/A 03/08/2016   Procedure: SAVORY DILATION;  Surgeon: Danie Binder, MD;  [facility];  Service: Endoscopy;  Laterality: N/A;     OB History    Gravida  6   Para  6   Term  6   Preterm      AB      Living  4     SAB      TAB      Ectopic      Multiple      Live Births               Home Medications    Prior to Admission medications   Medication Sig Start Date End Date Taking? Authorizing Provider  donepezil (ARICEPT) 10 MG tablet Take 10 mg by mouth at bedtime.   Yes [provider]  hydrALAZINE (APRESOLINE) 25 MG tablet Take 25 mg by mouth 3 (three) times daily.   Yes [provider]  acetaminophen (TYLENOL) 500 MG tablet Take 500 mg by mouth every 8 (eight) hours as needed for mild pain.     [provider]  ferrous sulfate 325 (65 FE) MG tablet Take 325 mg by mouth daily.    [provider]  hydrochlorothiazide (HYDRODIURIL) 25 MG tablet Take 25 mg by mouth daily.    [provider]  lisinopril (PRINIVIL,ZESTRIL) 40 MG tablet Take 40 mg by mouth daily.    [provider]  omeprazole (PRILOSEC) 20 MG capsule 1 po bid 30 minutes before  meals 03/08/16   Fields, Marga Melnick, MD  spironolactone (ALDACTONE) 25 MG tablet Take 25 mg by mouth daily.    [provider]  Vitamin D, Ergocalciferol, (DRISDOL) 50000 units CAPS capsule Take 50,000 Units by mouth every 30 (thirty) days.    [provider]    Family History Family History  Problem Relation Age of Onset  . Colon cancer Brother        ?  Marland Kitchen Breast cancer Daughter   . Pancreatic cancer Son     Social History Social History   Tobacco Use  . Smoking status: Never Smoker  . Smokeless tobacco: Never Used  Substance Use Topics  . Alcohol use: No  . Drug use: No     Allergies   Patient has no known allergies.   Review of Systems Review of Systems  Unable to perform ROS: Dementia     Physical Exam Updated Vital Signs BP (!) 162/57 (BP Location: Right Arm)   Pulse 71   Temp 97.7 F (36.5 C) (Oral)   Resp  20   Wt 57.2 kg (126 lb)   SpO2 100%   BMI 20.97 kg/m   Physical Exam  Constitutional: She is oriented to person, place, and time.  Pleasant, no acute distress  HENT:  Head: Normocephalic and atraumatic.  Eyes: Conjunctivae are normal.  Neck: Neck supple.  Cardiovascular: Normal rate and regular rhythm.  Pulmonary/Chest: Effort normal and breath sounds normal.  Abdominal: Soft. Bowel sounds are normal.  Genitourinary:  Genitourinary Comments: Rectal exam: No masses, brown stool, heme-negative  Musculoskeletal: Normal range of motion.  Neurological: She is alert and oriented to person, place, and time.  Skin: Skin is warm and dry.  Psychiatric: She has a normal mood and affect. Her behavior is normal.  Nursing note and vitals reviewed.    ED Treatments / Results  Labs (all labs ordered are listed, but only abnormal results are displayed) Labs Reviewed  BASIC METABOLIC PANEL - Abnormal; Notable for the following components:      Result Value   Sodium 131 (*)    Glucose, Bld 132 (*)    Creatinine, Ser 1.67 (*)    GFR calc non Af Amer 27 (*)    GFR calc Af Amer 31 (*)    All other components within normal limits  CBC - Abnormal; Notable for the following components:   RBC 2.85 (*)    Hemoglobin 7.9 (*)    HCT 24.8 (*)    All other components within normal limits  URINALYSIS, ROUTINE W REFLEX MICROSCOPIC - Abnormal; Notable for the following components:   Leukocytes, UA TRACE (*)    All other components within normal limits  CBG MONITORING, ED - Abnormal; Notable for the following components:   Glucose-Capillary 161 (*)    All other components within normal limits  CBG MONITORING, ED  POC OCCULT BLOOD, ED    EKG EKG Interpretation  Date/Time:  Tuesday Jun 20 2017 13:01:57 EDT Ventricular Rate:  71 PR Interval:  168 QRS Duration: 78 QT Interval:  370 QTC Calculation: 402 R Axis:   33 Text Interpretation:  Normal sinus rhythm Normal ECG Confirmed by Nat Christen  [phone]) on 06/20/2017 4:24:46 PM   Radiology Ct Head Wo Contrast  Result Date: 06/20/2017 CLINICAL DATA:  Recurrent syncope, dementia EXAM: CT HEAD WITHOUT CONTRAST TECHNIQUE: Contiguous axial images were obtained from the base of the skull through the vertex without intravenous contrast. COMPARISON:  None. FINDINGS: Brain: Moderate atrophy  with extensive chronic microvascular ischemia throughout the white matter. Negative for acute infarct or hemorrhage 1 cm partially calcified extra-axial mass in the left parietal region with skull changes. There is some erosion of the parietal bone with sclerotic well-defined margins. No vasogenic edema in the adjacent brain. Findings most consistent with meningioma. Vascular: Negative for hyperdense vessel Skull: Chronic appearing bony changes with associated extra-axial calcified mass left parietal region. Negative for skull fracture Sinuses/Orbits: Negative Other: None IMPRESSION: Atrophy with extensive chronic microvascular ischemia. No acute infarct. 1 cm calcified extra-axial mass left parietal region with skull changes most consistent with meningioma. Electronically Signed   By: Franchot Gallo M.D.   On: 06/20/2017 17:26    Procedures Procedures (including critical care time)  Medications Ordered in ED Medications  sodium chloride 0.9 % bolus 500 mL (500 mLs Intravenous New Bag/Given 06/20/17 1601)     Initial Impression / Assessment and Plan / ED Course  I have reviewed the triage vital signs and the nursing notes.  Pertinent labs & imaging results that were available during my care of the patient were reviewed by me and considered in my medical decision making (see chart for details).     Patient presents with syncopal event and questionable right hand shaking.  CT head shows 1 cm extra-axial mass left parietal region.  EKG normal.  Hemoglobin his dropped from 10.3 on 06/10/2015 to 7.9 today.  Will discuss with hospitalist for probable  admission.  Final Clinical Impressions(s) / ED Diagnoses   Final diagnoses:  Syncope, unspecified syncope type  Anemia, unspecified type    ED Discharge Orders    None       Nat Christen, MD 06/20/17 [phone]

## 2017-06-20 NOTE — ED Notes (Signed)
Gave EKG to Dr. Knapp  

## 2017-06-20 NOTE — ED Notes (Signed)
Notified pt of wait time. Alert and oriented. No further complaints.

## 2017-06-21 ENCOUNTER — Encounter (HOSPITAL_COMMUNITY): Payer: Self-pay | Admitting: Gastroenterology

## 2017-06-21 ENCOUNTER — Inpatient Hospital Stay (HOSPITAL_COMMUNITY): Payer: Medicare Other

## 2017-06-21 ENCOUNTER — Inpatient Hospital Stay (HOSPITAL_COMMUNITY)
Admit: 2017-06-21 | Discharge: 2017-06-21 | Disposition: A | Discharge status: Home / Self Care | Payer: Medicare Other | Attending: Internal Medicine | Admitting: Internal Medicine

## 2017-06-21 ENCOUNTER — Inpatient Hospital Stay (HOSPITAL_COMMUNITY): Payer: Self-pay

## 2017-06-21 DIAGNOSIS — D509 Iron deficiency anemia, unspecified: Principal | ICD-10-CM

## 2017-06-21 DIAGNOSIS — R55 Syncope and collapse: Secondary | ICD-10-CM

## 2017-06-21 LAB — IRON AND TIBC
IRON: 20 ug/dL — AB (ref 28–170)
Iron: 19 ug/dL — ABNORMAL LOW (ref 28–170)
Saturation Ratios: 5 % — ABNORMAL LOW (ref 10.4–31.8)
Saturation Ratios: 5 % — ABNORMAL LOW (ref 10.4–31.8)
TIBC: 435 ug/dL (ref 250–450)
TIBC: 371 ug/dL (ref 250–450)
UIBC: 415 ug/dL
UIBC: 352 ug/dL

## 2017-06-21 LAB — COMPREHENSIVE METABOLIC PANEL
ALBUMIN: 3 g/dL — AB (ref 3.5–5.0)
ALK PHOS: 79 U/L (ref 38–126)
ALT: 6 U/L — AB (ref 14–54)
AST: 12 U/L — ABNORMAL LOW (ref 15–41)
Anion gap: 7 (ref 5–15)
BUN: 15 mg/dL (ref 6–20)
CO2: 21 mmol/L — ABNORMAL LOW (ref 22–32)
CREATININE: 1.42 mg/dL — AB (ref 0.44–1.00)
Calcium: 8.5 mg/dL — ABNORMAL LOW (ref 8.9–10.3)
Chloride: 107 mmol/L (ref 101–111)
GFR calc Af Amer: 38 mL/min — ABNORMAL LOW (ref >60)
GFR calc non Af Amer: 33 mL/min — ABNORMAL LOW (ref >60)
Glucose, Bld: 80 mg/dL (ref 65–99)
Potassium: 3.9 mmol/L (ref 3.5–5.1)
SODIUM: 135 mmol/L (ref 135–145)
TOTAL PROTEIN: 5.6 g/dL — AB (ref 6.5–8.1)
Total Bilirubin: 0.5 mg/dL (ref 0.3–1.2)

## 2017-06-21 LAB — TROPONIN I
Troponin I: <0.03 ng/mL (ref <0.03)
Troponin I: <0.03 ng/mL (ref <0.03)

## 2017-06-21 LAB — ABO/RH: ABO/RH(D): B POS

## 2017-06-21 LAB — CK TOTAL AND CKMB (NOT AT ARMC)
CK, MB: 0.9 ng/mL (ref 0.5–5.0)
RELATIVE INDEX: INVALID (ref 0.0–2.5)
Total CK: 55 U/L (ref 38–234)

## 2017-06-21 LAB — CBC
HCT: 20.1 % — ABNORMAL LOW (ref 36.0–46.0)
Hemoglobin: 6.4 g/dL — CL (ref 12.0–15.0)
MCH: 27.5 pg (ref 26.0–34.0)
MCHC: 31.8 g/dL (ref 30.0–36.0)
MCV: 86.3 fL (ref 78.0–100.0)
PLATELETS: 305 10*3/uL (ref 150–400)
RBC: 2.33 MIL/uL — AB (ref 3.87–5.11)
RDW: 13.7 % (ref 11.5–15.5)
WBC: 4.7 10*3/uL (ref 4.0–10.5)

## 2017-06-21 LAB — PREPARE RBC (CROSSMATCH)

## 2017-06-21 LAB — RETICULOCYTES
RBC.: 2.37 MIL/uL — ABNORMAL LOW (ref 3.87–5.11)
RETIC COUNT ABSOLUTE: 23.7 10*3/uL (ref 19.0–186.0)
Retic Ct Pct: 1 % (ref 0.4–3.1)

## 2017-06-21 LAB — VITAMIN B12
Vitamin B-12: 214 pg/mL (ref 180–914)
Vitamin B-12: 237 pg/mL (ref 180–914)

## 2017-06-21 LAB — FOLATE: FOLATE: 13.7 ng/mL (ref >5.9)

## 2017-06-21 LAB — FERRITIN
FERRITIN: 6 ng/mL — AB (ref 11–307)
FERRITIN: 5 ng/mL — AB (ref 11–307)

## 2017-06-21 MED ORDER — CYANOCOBALAMIN 1000 MCG/ML IJ SOLN
1000.0000 ug | Freq: Once | INTRAMUSCULAR | Status: AC
  Administered 2017-06-21: 1000 ug via INTRAMUSCULAR
  Filled 2017-06-21: qty 1

## 2017-06-21 MED ORDER — FERUMOXYTOL INJECTION 510 MG/17 ML
510.0000 mg | Freq: Once | INTRAVENOUS | Status: AC
  Administered 2017-06-21: 510 mg via INTRAVENOUS
  Filled 2017-06-21: qty 17

## 2017-06-21 MED ORDER — TECHNETIUM TO 99M ALBUMIN AGGREGATED
4.1000 | Freq: Once | INTRAVENOUS | Status: AC | PRN
  Administered 2017-06-21: 4.1 via INTRAVENOUS

## 2017-06-21 MED ORDER — LEVETIRACETAM 250 MG PO TABS
250.0000 mg | ORAL_TABLET | Freq: Two times a day (BID) | ORAL | Status: DC
  Administered 2017-06-21 – 2017-06-23 (×4): 250 mg via ORAL
  Filled 2017-06-21 (×4): qty 1

## 2017-06-21 MED ORDER — DOCUSATE SODIUM 100 MG PO CAPS
200.0000 mg | ORAL_CAPSULE | Freq: Every day | ORAL | Status: DC
  Administered 2017-06-21: 200 mg via ORAL
  Filled 2017-06-21 (×3): qty 2

## 2017-06-21 MED ORDER — SODIUM CHLORIDE 0.9 % IV SOLN: Freq: Once | INTRAVENOUS | Status: DC

## 2017-06-21 MED ORDER — PANTOPRAZOLE SODIUM 40 MG PO TBEC
40.0000 mg | DELAYED_RELEASE_TABLET | Freq: Two times a day (BID) | ORAL | Status: DC
  Administered 2017-06-21: 40 mg via ORAL
  Filled 2017-06-21 (×2): qty 1

## 2017-06-21 MED ORDER — SODIUM CHLORIDE 0.9 % IV SOLN
INTRAVENOUS | Status: DC
  Administered 2017-06-21 – 2017-06-23 (×3): via INTRAVENOUS

## 2017-06-21 NOTE — Procedures (Signed)
  [place] A. Merlene Laughter, MD     [email or web address]           HISTORY: The patient 84- year old who presents with episode of loss of consciousness associated with convulsions of the right upper extremity.  Study is being done to evaluate for epileptic seizures.  MEDICATIONS: Scheduled Meds: . donepezil  10 mg Oral QHS  . hydrALAZINE  25 mg Oral BID  . hydrochlorothiazide  25 mg Oral Daily  . lisinopril  40 mg Oral Daily  . pantoprazole  40 mg Oral BID  . spironolactone  25 mg Oral Daily   Continuous Infusions: . sodium chloride    . sodium chloride     PRN Meds:.acetaminophen **OR** acetaminophen  Prior to Admission medications   Medication Sig Start Date End Date Taking? Authorizing Provider  donepezil (ARICEPT) 10 MG tablet Take 10 mg by mouth at bedtime.   Yes [provider]  hydrALAZINE (APRESOLINE) 25 MG tablet Take 25 mg by mouth 2 (two) times daily.    Yes [provider]  hydrochlorothiazide (HYDRODIURIL) 25 MG tablet Take 25 mg by mouth daily.   Yes [provider]  lisinopril (PRINIVIL,ZESTRIL) 40 MG tablet Take 40 mg by mouth daily.   Yes [provider]  omeprazole (PRILOSEC) 20 MG capsule 1 po bid 30 minutes before meals Patient taking differently: Take 20 mg by mouth daily before breakfast. 1 po bid 30 minutes before meals 03/08/16  Yes Fields, Sandi L, MD  spironolactone (ALDACTONE) 25 MG tablet Take 25 mg by mouth daily.   Yes [provider]      ANALYSIS: A 16 channel recording using standard 10 20 measurements is conducted for 21 minutes.  There is a well-formed posterior dominant rhythm of 10 and half hertz which attenuates with eye opening.  There is a beta activity observed in the frontal areas.  Awake and drowsy activities are mostly seen.  There is one epoch  Of spindles and ill-defined K complexes seen.  There is occasional frontal intermittent rhythmic delta activity observed.  No focal or  lateral slowing is observed.  No epileptiform activities are observed.    IMPRESSION: 1.  This recording of awake and drowsy state is remarkable for infrequent frontal intermittent rhythmic delta activity sometimes associated with toxic metabolic processes.  Otherwise, the recording is unrevealing.       Joie Hipps A. Merlene Laughter, M.D.  Diplomate, Tax adviser of Psychiatry and Neurology ( Neurology).

## 2017-06-21 NOTE — Progress Notes (Signed)
Pt transported down to MRI via transport staff.

## 2017-06-21 NOTE — Progress Notes (Signed)
EEG completed, results pending. 

## 2017-06-21 NOTE — Consult Note (Signed)
Referring Provider: Isaac Bliss, Olam Idler* Primary Care Physician:  [name], MD Primary Gastroenterologist:  Barney Drain, MD   Reason for Consultation:  anemia  HPI: Tina Kline is a 84 y.o. female presented to the emergency department yesterday after syncopal episode just after finishing her breakfast.  Patient reportedly lost consciousness for a couple of minutes.  She had a similar episode about a month ago.  At least 1-2 episodes prior to that but it have been about 6 months between those episodes.  And with her PCP Dr. Legrand Rams.  Carotid ultrasounds done back in April showed mild bilateral carotid atherosclerosis but no significant stenosis.  Yesterday she had a head CT showing atrophy with extensive chronic microvascular ischemia, 1 cm calcified extra-axial mass left parietal region with skull changes most consistent with meningioma.  MRI brain today showed 52mm meningioma the left parietal convexity without significant mass-effect upon the brain, chronic advanced small vessel ischemic changes throughout the brain seen.  Pulmonary perfusion study this afternoon showed single moderate subsegmental perfusion defect in the right lower lobe with additional small subsegmental perfusion defects in the right upper and right middle lobes.  Multiple etiologies possible including pulmonary emboli.  Study performed due to elevated d-dimer.  We were asked to see the patient due to anemia.  On admission her hemoglobin was 7.9, looking back through epic her hemoglobin appeared to be in the 10 range for the past several years.  Her MCV is normal at 87.  Hemoccult negative x1.  Ferritin was 6, iron 20, iron saturations 5%, TIBC 435.  Vitamin B12 214, folate pending.  Sodium was 131, creatinine 1.67, BUN 17, glucose 132.  This morning her hemoglobin was 6.4.   At baseline patient has dementia.  Daughter is at bedside.  Patient lives with her.  There is been no reported fresh blood per rectum.   Stools are often dark, possibly black.  No reported abdominal pain.  No vomiting.  No constipation.  She has had some weight loss.  We last saw her in October 2017 and she weighed 134 pounds then.  In May 2017 she weighed 148 pounds.  She is down to 115 pounds.  Her last colonoscopy was in May 2017, she had a single tubular adenoma removed.  She has had several EGDs, last one in January 2018 with proximal esophageal web status post dilation, gastritis, moderate hiatal hernia.  She has had at least 3 esophageal dilation since May 2017.  According to patient's daughter, they are having increasing difficulty to get her to swallow anything with significant consistency.  She does pretty well with boost, applesauce.  They crushed her pills.  Any solid food she tends to chew for a while and then spits it out.  They are not sure if this is related to her dementia getting worse or if this due to dysphagia.  Today she is alert and oriented to person only.  She knows her name and date of birth.  She believes that she is at home.  Prior to Admission medications   Medication Sig Start Date End Date Taking? Authorizing Provider  donepezil (ARICEPT) 10 MG tablet Take 10 mg by mouth at bedtime.   Yes [provider]  hydrALAZINE (APRESOLINE) 25 MG tablet Take 25 mg by mouth 2 (two) times daily.    Yes [provider]  hydrochlorothiazide (HYDRODIURIL) 25 MG tablet Take 25 mg by mouth daily.   Yes [provider]  lisinopril (PRINIVIL,ZESTRIL) 40 MG tablet Take  40 mg by mouth daily.   Yes [provider]  omeprazole (PRILOSEC) 20 MG capsule 1 po bid 30 minutes before meals Patient taking differently: Take 20 mg by mouth daily before breakfast. 1 po bid 30 minutes before meals 03/08/16  Yes Fields, Sandi L, MD  spironolactone (ALDACTONE) 25 MG tablet Take 25 mg by mouth daily.   Yes [provider]    Current Facility-Administered Medications  Medication Dose Route Frequency  Provider Last Rate Last Dose  . 0.9 %  sodium chloride infusion   Intravenous Once Reubin Milan, MD      . acetaminophen (TYLENOL) tablet 650 mg  650 mg Oral Q6H PRN Jani Gravel, MD       Or  . acetaminophen (TYLENOL) suppository 650 mg  650 mg Rectal Q6H PRN Jani Gravel, MD      . donepezil (ARICEPT) tablet 10 mg  10 mg Oral Loma Sousa, MD   10 mg at 06/20/17 2207  . hydrALAZINE (APRESOLINE) tablet 25 mg  25 mg Oral BID Jani Gravel, MD   25 mg at 06/21/17 1001  . hydrochlorothiazide (HYDRODIURIL) tablet 25 mg  25 mg Oral Daily Jani Gravel, MD   25 mg at 06/21/17 1001  . lisinopril (PRINIVIL,ZESTRIL) tablet 40 mg  40 mg Oral Daily Jani Gravel, MD   40 mg at 06/21/17 1001  . pantoprazole (PROTONIX) EC tablet 40 mg  40 mg Oral Daily Jani Gravel, MD   40 mg at 06/21/17 1001  . spironolactone (ALDACTONE) tablet 25 mg  25 mg Oral Daily Jani Gravel, MD   25 mg at 06/21/17 1001    Allergies as of 06/20/2017  . (No Known Allergies)    Past Medical History:  Diagnosis Date  . Anemia   . Dementia   . Hypertension     Past Surgical History:  Procedure Laterality Date  . APPENDECTOMY    . COLONOSCOPY N/A 06/26/2015   Dr. Oneida Alar: Single tubular adenoma removed.  No future colonoscopies for surveillance purposes given age.  . ESOPHAGOGASTRODUODENOSCOPY N/A 01/29/2016   Dr. Oneida Alar: proximal esophageal web s/p dilation, large hiatal hernia, gastritis  . ESOPHAGOGASTRODUODENOSCOPY N/A 03/08/2016   Dr. Oneida Alar: proximal esophagel web s/p dilation, gastritis, moderate hiatal hernia  . ESOPHAGOGASTRODUODENOSCOPY  06/2015   Dr. Oneida Alar: Large hiatal hernia, gastritis, proximal esophageal web status post dilation  . MOUTH SURGERY    . SAVORY DILATION N/A 01/29/2016   Procedure: SAVORY DILATION;  Surgeon: Danie Binder, MD;  Location: [facility];  Service: Endoscopy;  Laterality: N/A;  . SAVORY DILATION N/A 03/08/2016   Procedure: SAVORY DILATION;  Surgeon: Danie Binder, MD;  Location: [facility];  Service: Endoscopy;  Laterality: N/A;    Family History  Problem Relation Age of Onset  . Breast cancer Daughter   . Pancreatic cancer Son   . Colon cancer Son     Social History   Socioeconomic History  . Marital status: Widowed    Spouse name: Not on file  . Number of children: 6  . Years of education: Not on file  . Highest education level: Not on file  Occupational History  . Not on file  Social Needs  . Financial resource strain: Not on file  . Food insecurity:    Worry: Not on file    Inability: Not on file  . Transportation needs:    Medical: Not on file    Non-medical: Not on file  Tobacco Use  .  Smoking status: Never Smoker  . Smokeless tobacco: Never Used  Substance and Sexual Activity  . Alcohol use: No  . Drug use: No  . Sexual activity: Not on file  Lifestyle  . Physical activity:    Days per week: Not on file    Minutes per session: Not on file  . Stress: Not on file  Relationships  . Social connections:    Talks on phone: Not on file    Gets together: Not on file    Attends religious service: Not on file    Active member of club or organization: Not on file    Attends meetings of clubs or organizations: Not on file    Relationship status: Not on file  . Intimate partner violence:    Fear of current or ex partner: Not on file    Emotionally abused: Not on file    Physically abused: Not on file    Forced sexual activity: Not on file  Other Topics Concern  . Not on file  Social History Narrative  . Not on file     ROS: Unreliable from the patient.  See HPI.       Physical Examination: Vital signs in last 24 hours: Temp:  [97.7 F (36.5 C)-98.7 F (37.1 C)] 98.6 F (37 C) (05/15 0512) Pulse Rate:  [68-82] 75 (05/15 0512) Resp:  [12-20] 15 (05/15 0512) BP: (105-162)/(56-66) 105/56 (05/15 0512) SpO2:  [99 %-100 %] 100 % (05/15 0512) Weight:  [115 lb 4.8 oz (52.3 kg)-116 lb 6.4 oz (52.8 kg)] 116 lb 6.4 oz (52.8 kg) (05/15 0512)     General: Well-nourished, well-developed in no acute distress.  Very pleasant.  Cooperative. Head: Normocephalic, atraumatic.   Eyes: Conjunctiva pink, no icterus. Mouth: Oropharyngeal mucosa moist and pink , no lesions erythema or exudate. Neck: Supple without thyromegaly, masses, or lymphadenopathy.  Lungs: Clear to auscultation bilaterally.  Heart: Regular rate and rhythm, no murmurs rubs or gallops.  Abdomen: Bowel sounds are normal, nontender, nondistended, no hepatosplenomegaly or masses, no abdominal bruits or    hernia , no rebound or guarding.   Rectal: Not performed. Extremities: No lower extremity edema, clubbing, deformity.  Neuro: Alert and oriented x 4 , grossly normal neurologically.  Skin: Warm and dry, no rash or jaundice.   Psych: Alert and cooperative, normal mood and affect.        Intake/Output from previous day: 05/14 0701 - 05/15 0700 In: 1508.8 [P.O.:240; I.V.:768.8; IV Piggyback:500] Out: 450 [Urine:450] Intake/Output this shift: Total I/O In: 240 [P.O.:240] Out: -   Lab Results: CBC Recent Labs    06/20/17 1513 06/21/17 0143  WBC 4.3 4.7  HGB 7.9* 6.4*  HCT 24.8* 20.1*  MCV 87.0 86.3  PLT 378 305   BMET Recent Labs    06/20/17 1513 06/21/17 0143  NA 131* 135  K 4.4 3.9  CL 102 107  CO2 22 21*  GLUCOSE 132* 80  BUN 17 15  CREATININE 1.67* 1.42*  CALCIUM 9.1 8.5*   LFT Recent Labs    06/21/17 0143  BILITOT 0.5  ALKPHOS 79  AST 12*  ALT 6*  PROT 5.6*  ALBUMIN 3.0*    Lipase No results for input(s): LIPASE in the last 72 hours.  PT/INR No results for input(s): LABPROT, INR in the last 72 hours.    Imaging Studies: Ct Head Wo Contrast  Result Date: 06/20/2017 CLINICAL DATA:  Recurrent syncope, dementia EXAM: CT HEAD WITHOUT CONTRAST TECHNIQUE: Contiguous axial images  were obtained from the base of the skull through the vertex without intravenous contrast. COMPARISON:  None. FINDINGS: Brain: Moderate atrophy with extensive  chronic microvascular ischemia throughout the white matter. Negative for acute infarct or hemorrhage 1 cm partially calcified extra-axial mass in the left parietal region with skull changes. There is some erosion of the parietal bone with sclerotic well-defined margins. No vasogenic edema in the adjacent brain. Findings most consistent with meningioma. Vascular: Negative for hyperdense vessel Skull: Chronic appearing bony changes with associated extra-axial calcified mass left parietal region. Negative for skull fracture Sinuses/Orbits: Negative Other: None IMPRESSION: Atrophy with extensive chronic microvascular ischemia. No acute infarct. 1 cm calcified extra-axial mass left parietal region with skull changes most consistent with meningioma. Electronically Signed   By: Franchot Gallo M.D.   On: 06/20/2017 17:26   Mr Brain Wo Contrast  Result Date: 06/21/2017 CLINICAL DATA:  Syncope.  Meningioma suspected on head CT. EXAM: MRI HEAD WITHOUT CONTRAST TECHNIQUE: Multiplanar, multiecho pulse sequences of the brain and surrounding structures were obtained without intravenous contrast. COMPARISON:  CT 06/20/2017 FINDINGS: Brain: Diffusion imaging does not show any acute or subacute infarction. There chronic small-vessel ischemic changes of the pons. No focal cerebellar insult. Cerebral hemispheres show advanced chronic small-vessel ischemic changes affecting the hemispheric white matter with some involvement also of the thalami and basal ganglia. No cortical or large vessel territory infarction. No intra-axial mass lesion, hemorrhage, hydrocephalus or extra-axial collection. The examination confirms the presence of a 16 mm meningioma of the left parietal convexity without significant mass-effect upon the brain or any associated brain edema. Vascular: Major vessels at the base of the brain show flow. Skull and upper cervical spine: Negative Sinuses/Orbits: Sinuses are clear. Orbits are negative. Divergent gaze noted.  Other: None significant IMPRESSION: No acute intracranial finding. Advanced chronic small-vessel ischemic changes throughout the brain as outlined above. 16 mm meningioma the left parietal convexity without significant mass-effect upon the brain. Electronically Signed   By: Nelson Chimes M.D.   On: 06/21/2017 08:29   Nm Pulmonary Perfusion  Result Date: 06/21/2017 CLINICAL DATA:  Elevated D-dimer, suspected pulmonary embolism EXAM: NUCLEAR MEDICINE PERFUSION LUNG SCAN TECHNIQUE: Perfusion images were obtained in multiple projections after intravenous injection of radiopharmaceutical. Patient was unable to tolerate ventilation exam. RADIOPHARMACEUTICALS:  4.1 mCi Tc-42m MAA IV COMPARISON:  None Correlation: Chest radiograph 06/21/2017 FINDINGS: Single moderate subsegmental perfusion defect at RIGHT lower lobe. Small subsegmental perfusion defects RIGHT upper lobe and RIGHT middle lobe. No LEFT lung perfusion defects identified. No ventilation images obtained. IMPRESSION: Single moderate subsegmental perfusion defect in RIGHT lower lobe with additional small subsegmental perfusion defects in RIGHT upper and RIGHT middle lobes. Clear chest radiograph at these locations. In the absence of ventilation exam, unable to provide a probability rating based on PIOPED II criteria. Observed perfusion defects could be caused by multiple etiologies, including pulmonary emboli. Electronically Signed   By: Lavonia Dana M.D.   On: 06/21/2017 14:59   US Carotid Bilateral  Result Date: 06/01/2017 CLINICAL DATA:  Syncope, hypertension EXAM: BILATERAL CAROTID DUPLEX ULTRASOUND TECHNIQUE: Pearline Cables scale imaging, color Doppler and duplex ultrasound were performed of bilateral carotid and vertebral arteries in the neck. COMPARISON:  None. FINDINGS: Criteria: Quantification of carotid stenosis is based on velocity parameters that correlate the residual internal carotid diameter with NASCET-based stenosis levels, using the diameter of the  distal internal carotid lumen as the denominator for stenosis measurement. The following velocity measurements were obtained: RIGHT ICA: 86/20 cm/sec CCA: 113/11  cm/sec SYSTOLIC ICA/CCA RATIO:  0.8 ECA:  65 cm/sec LEFT ICA: 92/24 cm/sec CCA: 01/0 cm/sec SYSTOLIC ICA/CCA RATIO:  1.4 ECA:  99 cm/sec RIGHT CAROTID ARTERY: Mild echogenic shadowing plaque formation. No hemodynamically significant right ICA stenosis, velocity elevation, or turbulent flow. Degree of narrowing less than 50%. RIGHT VERTEBRAL ARTERY:  Antegrade LEFT CAROTID ARTERY: Similar scattered mild echogenic plaque formation. No hemodynamically significant left ICA stenosis, velocity elevation, or turbulent flow. LEFT VERTEBRAL ARTERY:  Antegrade IMPRESSION: Mild bilateral carotid atherosclerosis. No hemodynamically significant ICA stenosis. Degree of narrowing less than 50% bilaterally by ultrasound criteria. Patent antegrade vertebral flow bilaterally Electronically Signed   By: Jerilynn Mages.  Shick M.D.   On: 06/01/2017 14:44   Dg Chest Port 1 View  Result Date: 06/21/2017 CLINICAL DATA:  Asthma, hypertension, correlation with V/Q scan EXAM: PORTABLE CHEST 1 VIEW COMPARISON:  Portable exam 1435 hours compared to 11/07/2014 FINDINGS: Normal heart size, mediastinal contours, and pulmonary vascularity. Skin folds project over LEFT chest. Lungs clear. No pleural effusion or pneumothorax. Bones demineralized with note of RIGHT AC joint degenerative changes. IMPRESSION: No acute abnormalities. Electronically Signed   By: Lavonia Dana M.D.   On: 06/21/2017 14:56  [4 week]   Impression: 84 year old female presenting due to syncopal episode.  This apparently is a second episode in the past 4 to 6 weeks.  Currently undergoing work-up to determine etiology of syncope.  On presentation, she was noted to have a decline from her baseline hemoglobin of 10 down to 7 range.  Overnight with hydration she is down in the 6 range.  There has been no overt GI bleeding during  admission, she is Hemoccult negative.  Family report dark possibly black stools intermittently.  She has documented iron deficiency.  Colonoscopy fairly up-to-date.  Other GI issues include history of esophageal web requiring dilation at least 3 times.  Last time was January 2018.  Patient basically not attempting to swallow solid foods anymore which may be in part due to progressive dementia possibly related to recurrent esophageal stricture.  Tolerates liquids without any difficulty.  She has a positive d-dimer.  Pulmonary perfusion study with defect in the right lower, upper and middle lobes.  In the absence of ventilation exam, unable to provide a probability rating based on PIOPED II criteria but possible etiologies include pulmonary emboli. Anticoagulation being held for now given severe anemia and hopefully CTA can be done if renal function improves.   Plan: 1. Would offer her an EGD with esophageal dilation this admission. Given positive pulmonary perfusion study and possibility of PE we may need to hold off until this is sorted out. To discuss with Dr. Gala Romney.  2. Would recommend PPI BID.  3. Monitor for bleeding. Transfuse as needed.   We would like to thank you for the opportunity to participate in the care of Tina Kline.  Laureen Ochs. Bernarda Caffey [facility] [phone] 5/15/20195:26 PM     LOS: 1 day

## 2017-06-21 NOTE — Consult Note (Signed)
[place] A. Merlene Laughter, MD     [email or web address]          Tina Kline is an 84 y.o. female.   ASSESSMENT/PLAN: Recurrent episodes of confusion and loss of consciousness associated with clonic activity of the right upper extremity: Given the referable left frontal meningioma, think the patient is most likely having complex partial seizures. As a result the patient would be treated with low-dose Keppra.  Small left frontal meningioma requiring no treatment at this time:   Vascular dementia: Consider Aricept in the future.   The patient 84 year old black female who has had recurrent episodes of loss of consciousness. The semiology is that the patient come unresponsive thin falls backward with eyes rolled back. She's had a couple of spells associated with shaking of the right upper extremity. The episodes last about 30 seconds. She recovers rather quickly and the on previous episodes refused to go to the emergency room. Patient has baseline short-term memory impairment and in fact has been diagnosed with early dementia. It does not appear she has been treated. She does lives with her daughter and is able to function decently. She ambulates without assistive devices. She eats her food and takes care of her personal hygiene issues. She reports no complaints at this time. The review systems is limited but unremarkable.        GENERAL: This a very pleasant female who is in no acute distress.  HEENT: Neck is supple. There is a marked exotropia on the right.  ABDOMEN: soft  EXTREMITIES: No edema; there is severe arthritic changes of the knees bilaterally. There is also generalized arthritic changes throughout.   BACK: Normal  SKIN: Normal by inspection.    MENTAL STATUS: She is dozing but easily arousable and cooperative with evaluation. She is mostly lucid although not oriented.  CRANIAL NERVES: Pupils are equal, round and reactive to light and accomodation;  extra ocular movements are full, there is no significant nystagmus; visual fields are full; upper and lower facial muscles are normal in strength and symmetric, there is no flattening of the nasolabial folds; tongue is midline; uvula is midline; shoulder elevation is normal.  MOTOR:  She has antigravity strength in the legs and also the upper extremities. Bulk and tone are normal throughout.  COORDINATION: Left finger to nose is normal, right finger to nose is normal, No rest tremor; no intention tremor; no postural tremor; no bradykinesia.   REFLEXES: Deep tendon reflexes are symmetrical and normal.   SENSATION: He response to painful stimuli bilaterally.        Blood pressure (!) 111/53, pulse 75, temperature 98.9 F (37.2 C), temperature source Oral, resp. rate 18, height 5' 2"  (1.575 m), weight 116 lb 6.4 oz (52.8 kg), SpO2 100 %.  Past Medical History:  Diagnosis Date  . Anemia   . Dementia   . Hypertension     Past Surgical History:  Procedure Laterality Date  . APPENDECTOMY    . COLONOSCOPY N/A 06/26/2015   Dr. Oneida Alar: Single tubular adenoma removed.  No future colonoscopies for surveillance purposes given age.  . ESOPHAGOGASTRODUODENOSCOPY N/A 01/29/2016   Dr. Oneida Alar: proximal esophageal web s/p dilation, large hiatal hernia, gastritis  . ESOPHAGOGASTRODUODENOSCOPY N/A 03/08/2016   Dr. Oneida Alar: proximal esophagel web s/p dilation, gastritis, moderate hiatal hernia  . ESOPHAGOGASTRODUODENOSCOPY  06/2015   Dr. Oneida Alar: Large hiatal hernia, gastritis, proximal esophageal web status post dilation  . MOUTH SURGERY    . SAVORY DILATION N/A 01/29/2016  Procedure: SAVORY DILATION;  Surgeon: Danie Binder, MD;  [facility];  Service: Endoscopy;  Laterality: N/A;  . SAVORY DILATION N/A 03/08/2016   Procedure: SAVORY DILATION;  Surgeon: Danie Binder, MD;  Location: [facility];  Service: Endoscopy;  Laterality: N/A;    Family History  Problem Relation Age of  Onset  . Breast cancer Daughter   . Pancreatic cancer Son   . Colon cancer Son     Social History:  reports that she has never smoked. She has never used smokeless tobacco. She reports that she does not drink alcohol or use drugs.  Allergies: No Known Allergies  Medications: Prior to Admission medications   Medication Sig Start Date End Date Taking? Authorizing Provider  donepezil (ARICEPT) 10 MG tablet Take 10 mg by mouth at bedtime.   Yes [provider]  hydrALAZINE (APRESOLINE) 25 MG tablet Take 25 mg by mouth 2 (two) times daily.    Yes [provider]  hydrochlorothiazide (HYDRODIURIL) 25 MG tablet Take 25 mg by mouth daily.   Yes [provider]  lisinopril (PRINIVIL,ZESTRIL) 40 MG tablet Take 40 mg by mouth daily.   Yes [provider]  omeprazole (PRILOSEC) 20 MG capsule 1 po bid 30 minutes before meals Patient taking differently: Take 20 mg by mouth daily before breakfast. 1 po bid 30 minutes before meals 03/08/16  Yes Fields, Sandi L, MD  spironolactone (ALDACTONE) 25 MG tablet Take 25 mg by mouth daily.   Yes [provider]    Scheduled Meds: . donepezil  10 mg Oral QHS  . hydrALAZINE  25 mg Oral BID  . hydrochlorothiazide  25 mg Oral Daily  . lisinopril  40 mg Oral Daily  . pantoprazole  40 mg Oral BID  . spironolactone  25 mg Oral Daily   Continuous Infusions: . sodium chloride    . sodium chloride     PRN Meds:.acetaminophen **OR** acetaminophen     Results for orders placed or performed during the hospital encounter of 06/20/17 (from the past 48 hour(s))  CBG monitoring, ED     Status: Abnormal   Collection Time: 06/20/17 12:59 PM  Result Value Ref Range   Glucose-Capillary 161 (H) 65 - 99 mg/dL  Urinalysis, Routine w reflex microscopic     Status: Abnormal   Collection Time: 06/20/17  2:44 PM  Result Value Ref Range   Color, Urine YELLOW YELLOW   APPearance CLEAR CLEAR   Specific Gravity, Urine 1.009 1.005  - 1.030   pH 5.0 5.0 - 8.0   Glucose, UA NEGATIVE NEGATIVE mg/dL   Hgb urine dipstick NEGATIVE NEGATIVE   Bilirubin Urine NEGATIVE NEGATIVE   Ketones, ur NEGATIVE NEGATIVE mg/dL   Protein, ur NEGATIVE NEGATIVE mg/dL   Nitrite NEGATIVE NEGATIVE   Leukocytes, UA TRACE (A) NEGATIVE   RBC / HPF 0-5 0 - 5 RBC/hpf   WBC, UA 0-5 0 - 5 WBC/hpf   Bacteria, UA NONE SEEN NONE SEEN   Squamous Epithelial / LPF 0-5 0 - 5   Hyaline Casts, UA PRESENT     Comment: Performed at [facility], [address], [place],  [address]  Basic metabolic panel     Status: Abnormal   Collection Time: 06/20/17  3:13 PM  Result Value Ref Range   Sodium 131 (L) 135 - 145 mmol/L   Potassium 4.4 3.5 - 5.1 mmol/L   Chloride 102 101 - 111 mmol/L   CO2 22 22 - 32  mmol/L   Glucose, Bld 132 (H) 65 - 99 mg/dL   BUN 17 6 - 20 mg/dL   Creatinine, Ser 1.67 (H) 0.44 - 1.00 mg/dL   Calcium 9.1 8.9 - 10.3 mg/dL   GFR calc non Af Amer 27 (L) >60 mL/min   GFR calc Af Amer 31 (L) >60 mL/min    Comment: (NOTE) The eGFR has been calculated using the CKD EPI equation. This calculation has not been validated in all clinical situations. eGFR's persistently <60 mL/min signify possible Chronic Kidney Disease.    Anion gap 7 5 - 15    Comment: Performed at [facility], [address], [place], [place] [address]  CBC     Status: Abnormal   Collection Time: 06/20/17  3:13 PM  Result Value Ref Range   WBC 4.3 4.0 - 10.5 K/uL   RBC 2.85 (L) 3.87 - 5.11 MIL/uL   Hemoglobin 7.9 (L) 12.0 - 15.0 g/dL   HCT 24.8 (L) 36.0 - 46.0 %   MCV 87.0 78.0 - 100.0 fL   MCH 27.7 26.0 - 34.0 pg   MCHC 31.9 30.0 - 36.0 g/dL   RDW 13.8 11.5 - 15.5 %   Platelets 378 150 - 400 K/uL    Comment: Performed at [facility], [address], [place], [place] [address]  POC occult blood, ED Provider will collect     Status: None   Collection Time: 06/20/17  6:07 PM  Result Value Ref Range   Fecal Occult Bld NEGATIVE NEGATIVE  Troponin I (q 6hr x 3)      Status: None   Collection Time: 06/20/17  8:20 PM  Result Value Ref Range   Troponin I <0.03 <0.03 ng/mL    Comment: Performed at [facility], [address], [place], [place] [address]  CK total and CKMB (cardiac)not at [facility]     Status: None   Collection Time: 06/20/17  8:20 PM  Result Value Ref Range   Total CK 55 38 - 234 U/L    Comment: Performed at [facility], [address] [place], [place] [address]   CK, MB 0.9 0.5 - 5.0 ng/mL   Relative Index RELATIVE INDEX IS INVALID 0.0 - 2.5    Comment: WHEN CK < 100 U/L        Performed at [place] [address] [place], [place] [address]   Ferritin     Status: Abnormal   Collection Time: 06/20/17  8:23 PM  Result Value Ref Range   Ferritin 6 (L) 11 - 307 ng/mL    Comment: Performed at [facility], [place] [address], [place], [place] [address]  Iron and TIBC     Status: Abnormal   Collection Time: 06/20/17  8:23 PM  Result Value Ref Range   Iron 20 (L) 28 - 170 ug/dL   TIBC 435 250 - 450 ug/dL   Saturation Ratios 5 (L) 10.4 - 31.8 %   UIBC 415 ug/dL    Comment: Performed at [facility], [place] [address], [place], [place] [address]  Vitamin B12     Status: None   Collection Time: 06/20/17  8:23 PM  Result Value Ref Range   Vitamin B-12 214 180 - 914 pg/mL    Comment: (NOTE) This assay is not validated for testing neonatal or myeloproliferative syndrome specimens for Vitamin B12 levels. Performed at [facility], [place] [address] [place], [place] [address]   TSH  Status: None   Collection Time: 06/20/17  8:23 PM  Result Value Ref Range   TSH 1.303 0.350 - 4.500 uIU/mL    Comment: Performed by a 3rd Generation assay with a functional sensitivity of <=0.01 uIU/mL. Performed at [facility], [address], [place], [place] [address]   D-dimer, quantitative (not at [facility])     Status: Abnormal   Collection Time: 06/20/17  8:23 PM  Result Value Ref Range   D-Dimer, Quant 1.93 (H) 0.00 - 0.50  ug/mL-FEU    Comment: (NOTE) At the manufacturer cut-off of 0.50 ug/mL FEU, this assay has been documented to exclude PE with a sensitivity and negative predictive value of 97 to 99%.  At this time, this assay has not been approved by the FDA to exclude DVT/VTE. Results should be correlated with clinical presentation. Performed at [facility], [address], [place], [place] [address]   Troponin I (q 6hr x 3)     Status: None   Collection Time: 06/21/17  1:43 AM  Result Value Ref Range   Troponin I <0.03 <0.03 ng/mL    Comment: Performed at [facility], [address], [place], [place] [address]  Comprehensive metabolic panel     Status: Abnormal   Collection Time: 06/21/17  1:43 AM  Result Value Ref Range   Sodium 135 135 - 145 mmol/L   Potassium 3.9 3.5 - 5.1 mmol/L   Chloride 107 101 - 111 mmol/L   CO2 21 (L) 22 - 32 mmol/L   Glucose, Bld 80 65 - 99 mg/dL   BUN 15 6 - 20 mg/dL   Creatinine, Ser 1.42 (H) 0.44 - 1.00 mg/dL   Calcium 8.5 (L) 8.9 - 10.3 mg/dL   Total Protein 5.6 (L) 6.5 - 8.1 g/dL   Albumin 3.0 (L) 3.5 - 5.0 g/dL   AST 12 (L) 15 - 41 U/L   ALT 6 (L) 14 - 54 U/L   Alkaline Phosphatase 79 38 - 126 U/L   Total Bilirubin 0.5 0.3 - 1.2 mg/dL   GFR calc non Af Amer 33 (L) >60 mL/min   GFR calc Af Amer 38 (L) >60 mL/min    Comment: (NOTE) The eGFR has been calculated using the CKD EPI equation. This calculation has not been validated in all clinical situations. eGFR's persistently <60 mL/min signify possible Chronic Kidney Disease.    Anion gap 7 5 - 15    Comment: Performed at [facility], [address] [place], [place] [address]  CBC     Status: Abnormal   Collection Time: 06/21/17  1:43 AM  Result Value Ref Range   WBC 4.7 4.0 - 10.5 K/uL   RBC 2.33 (L) 3.87 - 5.11 MIL/uL   Hemoglobin 6.4 (LL) 12.0 - 15.0 g/dL    Comment: CRITICAL RESULT CALLED TO, READ BACK BY AND VERIFIED WITH: TANYA @ 0256 ON 5.15.19 BY BOWMAN,L    HCT 20.1 (L) 36.0 - 46.0 %   MCV  86.3 78.0 - 100.0 fL   MCH 27.5 26.0 - 34.0 pg   MCHC 31.8 30.0 - 36.0 g/dL   RDW 13.7 11.5 - 15.5 %   Platelets 305 150 - 400 K/uL    Comment: Performed at [facility], [address], [place], [place] [address]  Reticulocytes     Status: Abnormal   Collection Time: 06/21/17  1:43 AM  Result Value Ref Range   Retic Ct Pct 1.0 0.4 - 3.1 %   RBC. 2.37 (L) 3.87 -  5.11 MIL/uL   Retic Count, Absolute 23.7 19.0 - 186.0 K/uL    Comment: Performed at [facility], [address], [place], [place] [address]  Prepare RBC     Status: None   Collection Time: 06/21/17  5:18 AM  Result Value Ref Range   Order Confirmation      ORDER PROCESSED BY BLOOD BANK Performed at [facility], [address] [place], [place] [address]   Type and screen [facility]     Status: None (Preliminary result)   Collection Time: 06/21/17  5:18 AM  Result Value Ref Range   ABO/RH(D) B POS    Antibody Screen NEG    Sample Expiration 06/24/2017    Unit Number [id number]    Blood Component Type RED CELLS,LR    Unit division 00    Status of Unit ISSUED    Transfusion Status OK TO TRANSFUSE    Crossmatch Result Compatible    Unit Number [id number]    Blood Component Type RED CELLS,LR    Unit division 00    Status of Unit ISSUED    Transfusion Status OK TO TRANSFUSE    Crossmatch Result      Compatible Performed at [facility], [address] [place], [place] [address]   ABO/Rh     Status: None   Collection Time: 06/21/17  5:18 AM  Result Value Ref Range   ABO/RH(D)      B POS Performed at [facility], [address] [place], [place] [address]   Troponin I (q 6hr x 3)     Status: None   Collection Time: 06/21/17  8:25 AM  Result Value Ref Range   Troponin I <0.03 <0.03 ng/mL    Comment: Performed at [facility], [address], [place], [place] [address]    Studies/Results:  HEAD CT IMPRESSION: Atrophy with extensive chronic microvascular ischemia. No acute infarct.  1 cm  calcified extra-axial mass left parietal region with skull changes most consistent with meningioma.   CAROTID DOPPLERS  IMPRESSION: Mild bilateral carotid atherosclerosis. No hemodynamically significant ICA stenosis. Degree of narrowing less than 50% bilaterally by ultrasound criteria.  Patent antegrade vertebral flow bilaterally     BRAIN MRI FINDINGS: Brain: Diffusion imaging does not show any acute or subacute infarction. There chronic small-vessel ischemic changes of the pons. No focal cerebellar insult. Cerebral hemispheres show advanced chronic small-vessel ischemic changes affecting the hemispheric white matter with some involvement also of the thalami and basal ganglia. No cortical or large vessel territory infarction. No intra-axial mass lesion, hemorrhage, hydrocephalus or extra-axial collection. The examination confirms the presence of a 16 mm meningioma of the left parietal convexity without significant mass-effect upon the brain or any associated brain edema.  Vascular: Major vessels at the base of the brain show flow.  Skull and upper cervical spine: Negative  Sinuses/Orbits: Sinuses are clear. Orbits are negative. Divergent gaze noted.  Other: None significant  IMPRESSION: No acute intracranial finding. Advanced chronic small-vessel ischemic changes throughout the brain as outlined above.  16 mm meningioma the left parietal convexity without significant mass-effect upon the brain.    The brain MRI scan is reviewed in person there is a small extra-axial left posterior frontal isodense lesion with minimal mass effect.  This seems most consistent with meningioma.  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  There is moderate global atrophy.  No acute findings are appreciated.           Dushawn Pusey A.  Merlene Laughter, M.D.  Diplomate, Tax adviser of Psychiatry and Neurology ( Neurology). 06/21/2017, 7:48 PM

## 2017-06-21 NOTE — Progress Notes (Signed)
Pt is currently getting an EEG performed. After that is finished, pt is to get a nuclear medicine test. Transfusion of blood products will be started once patient back from nuclear medicine test.

## 2017-06-21 NOTE — Progress Notes (Addendum)
PROGRESS NOTE    Tina Kline  IRS:[id number] DOB: 03-26-1929 DOA: 06/20/2017 PCP: [name], MD     Brief Narrative:  84 year old woman admitted from home on 5/14 following a syncopal episode the morning of admission.  Daughter states she  had just finished her breakfast had stood up to take her plate to the sink sat back down and suddenly her eyes rolled back and she slumped to the side.  Admission was requested for evaluation of syncope.   Assessment & Plan:   Principal Problem:   Syncope Active Problems:   IDA (iron deficiency anemia)   Syncopal episode -With hemoglobin of 6.4, I think it is safe to say that this is likely the etiology of her syncope. -MRI scan done as part of the syncopal work-up shows a 16 mm meningioma without midline shift, unlikely to be cause of syncopal episode, will need follow-up. -VQ scan was incomplete as patient did not tolerate ventilation portion , there were some perfusion deficits however radiology is unable to provide a probability rating.  For now, given inability to anticoagulate given severe anemia, will elect to withhold treatment, hopefully renal function will improve sufficiently in order to do a CT angiogram at some point. -Check orthostatic vital signs.  Iron Deficiency Anemia -Hemoglobin 7.9 and admission, down to 6.4 today. -No objective signs of bleeding during this hospitalization.  Daughter states that she has noted her mother's stools to be dark. -Patient denies dyspepsia. -Continue PPI. -GI consultation is requested. Suspect would benefit from EGD this admission.  Will make n.p.o. after midnight in case GI decides to proceed with endoscopic studies tomorrow. -Ferritin is 6 which is classic iron deficiency anemia, chronic blood loss needs to be ruled out. -Will give IV Feraheme today followed by ferrous sulfate supplementation.  Mild hyponatremia -Suspect due to hypovolemia, resolved with IV fluids.  Acute on chronic  kidney disease stage -Baseline creatinine between 1.1 and 1.4. -Creatinine was 1.6 on admission, down to baseline of 1.4 on 5/15.   DVT prophylaxis: SCDs Code Status: Full code Family Communication: Daughter at bedside updated on plan of care and all questions answered Disposition Plan: pending medical stability and GI evaluation.  Consultants:   GI pending  Procedures:   None  Antimicrobials:  Anti-infectives (From admission, onward)   None       Subjective: In bed, feels significantly improved, no dizziness  Objective: Vitals:   06/20/17 1900 06/20/17 2008 06/21/17 0320 06/21/17 0512  BP: (!) 144/61 140/66 (!) 110/58 (!) 105/56  Pulse: 73 74 70 75  Resp: 20 20  15   Temp:  97.8 F (36.6 C) 98.7 F (37.1 C) 98.6 F (37 C)  TempSrc:  Oral Oral Oral  SpO2: 100% 100% 99% 100%  Weight:  52.3 kg (115 lb 4.8 oz)  52.8 kg (116 lb 6.4 oz)  Height:  5\' 2"  (1.575 m)      Intake/Output Summary (Last 24 hours) at 06/21/2017 1454 Last data filed at 06/21/2017 0958 Gross per 24 hour  Intake 1748.75 ml  Output 450 ml  Net 1298.75 ml   Filed Weights   06/20/17 1258 06/20/17 2008 06/21/17 0512  Weight: 57.2 kg (126 lb) 52.3 kg (115 lb 4.8 oz) 52.8 kg (116 lb 6.4 oz)    Examination:  General exam: Alert, awake, oriented x 3, strabismus is noted, chronic per daughter Respiratory system: Clear to auscultation. Respiratory effort normal. Cardiovascular system:RRR. No murmurs, rubs, gallops. Gastrointestinal system: Abdomen is nondistended, soft and nontender. No  organomegaly or masses felt. Normal bowel sounds heard. Central nervous system: Alert and oriented. No focal neurological deficits. Extremities: No C/C/E, +pedal pulses Skin: No rashes, lesions or ulcers Psychiatry: Judgement and insight appear normal. Mood & affect appropriate.     Data Reviewed: I have personally reviewed following labs and imaging studies  CBC: Recent Labs  Lab 06/20/17 1513  06/21/17 0143  WBC 4.3 4.7  HGB 7.9* 6.4*  HCT 24.8* 20.1*  MCV 87.0 86.3  PLT 378 956   Basic Metabolic Panel: Recent Labs  Lab 06/20/17 1513 06/21/17 0143  NA 131* 135  K 4.4 3.9  CL 102 107  CO2 22 21*  GLUCOSE 132* 80  BUN 17 15  CREATININE 1.67* 1.42*  CALCIUM 9.1 8.5*   GFR: Estimated Creatinine Clearance: 22.9 mL/min (A) (by C-G formula based on SCr of 1.42 mg/dL (H)). Liver Function Tests: Recent Labs  Lab 06/21/17 0143  AST 12*  ALT 6*  ALKPHOS 79  BILITOT 0.5  PROT 5.6*  ALBUMIN 3.0*   No results for input(s): LIPASE, AMYLASE in the last 168 hours. No results for input(s): AMMONIA in the last 168 hours. Coagulation Profile: No results for input(s): INR, PROTIME in the last 168 hours. Cardiac Enzymes: Recent Labs  Lab 06/20/17 2020 06/21/17 0143 06/21/17 0825  CKTOTAL 55  --   --   CKMB 0.9  --   --   TROPONINI <0.03 <0.03 <0.03   BNP (last 3 results) No results for input(s): PROBNP in the last 8760 hours. HbA1C: No results for input(s): HGBA1C in the last 72 hours. CBG: Recent Labs  Lab 06/20/17 1259  GLUCAP 161*   Lipid Profile: No results for input(s): CHOL, HDL, LDLCALC, TRIG, CHOLHDL, LDLDIRECT in the last 72 hours. Thyroid Function Tests: Recent Labs    06/20/17 2023  TSH 1.303   Anemia Panel: Recent Labs    06/20/17 2023 06/21/17 0143  VITAMINB12 214  --   FERRITIN 6*  --   TIBC 435  --   IRON 20*  --   RETICCTPCT  --  1.0   Urine analysis:    Component Value Date/Time   COLORURINE YELLOW 06/20/2017 1444   APPEARANCEUR CLEAR 06/20/2017 1444   LABSPEC 1.009 06/20/2017 1444   PHURINE 5.0 06/20/2017 1444   GLUCOSEU NEGATIVE 06/20/2017 1444   HGBUR NEGATIVE 06/20/2017 1444   BILIRUBINUR NEGATIVE 06/20/2017 1444   KETONESUR NEGATIVE 06/20/2017 1444   PROTEINUR NEGATIVE 06/20/2017 1444   UROBILINOGEN 0.2 01/15/2013 0425   NITRITE NEGATIVE 06/20/2017 1444   LEUKOCYTESUR TRACE (A) 06/20/2017 1444   Sepsis  Labs: @LABRCNTIP (procalcitonin:4,lacticidven:4)  )No results found for this or any previous visit (from the past 240 hour(s)).       Radiology Studies: Ct Head Wo Contrast  Result Date: 06/20/2017 CLINICAL DATA:  Recurrent syncope, dementia EXAM: CT HEAD WITHOUT CONTRAST TECHNIQUE: Contiguous axial images were obtained from the base of the skull through the vertex without intravenous contrast. COMPARISON:  None. FINDINGS: Brain: Moderate atrophy with extensive chronic microvascular ischemia throughout the white matter. Negative for acute infarct or hemorrhage 1 cm partially calcified extra-axial mass in the left parietal region with skull changes. There is some erosion of the parietal bone with sclerotic well-defined margins. No vasogenic edema in the adjacent brain. Findings most consistent with meningioma. Vascular: Negative for hyperdense vessel Skull: Chronic appearing bony changes with associated extra-axial calcified mass left parietal region. Negative for skull fracture Sinuses/Orbits: Negative Other: None IMPRESSION: Atrophy with extensive chronic microvascular  ischemia. No acute infarct. 1 cm calcified extra-axial mass left parietal region with skull changes most consistent with meningioma. Electronically Signed   By: Franchot Gallo M.D.   On: 06/20/2017 17:26   Mr Brain Wo Contrast  Result Date: 06/21/2017 CLINICAL DATA:  Syncope.  Meningioma suspected on head CT. EXAM: MRI HEAD WITHOUT CONTRAST TECHNIQUE: Multiplanar, multiecho pulse sequences of the brain and surrounding structures were obtained without intravenous contrast. COMPARISON:  CT 06/20/2017 FINDINGS: Brain: Diffusion imaging does not show any acute or subacute infarction. There chronic small-vessel ischemic changes of the pons. No focal cerebellar insult. Cerebral hemispheres show advanced chronic small-vessel ischemic changes affecting the hemispheric white matter with some involvement also of the thalami and basal ganglia. No  cortical or large vessel territory infarction. No intra-axial mass lesion, hemorrhage, hydrocephalus or extra-axial collection. The examination confirms the presence of a 16 mm meningioma of the left parietal convexity without significant mass-effect upon the brain or any associated brain edema. Vascular: Major vessels at the base of the brain show flow. Skull and upper cervical spine: Negative Sinuses/Orbits: Sinuses are clear. Orbits are negative. Divergent gaze noted. Other: None significant IMPRESSION: No acute intracranial finding. Advanced chronic small-vessel ischemic changes throughout the brain as outlined above. 16 mm meningioma the left parietal convexity without significant mass-effect upon the brain. Electronically Signed   By: Nelson Chimes M.D.   On: 06/21/2017 08:29        Scheduled Meds: . donepezil  10 mg Oral QHS  . hydrALAZINE  25 mg Oral BID  . hydrochlorothiazide  25 mg Oral Daily  . lisinopril  40 mg Oral Daily  . pantoprazole  40 mg Oral Daily  . spironolactone  25 mg Oral Daily   Continuous Infusions: . sodium chloride       LOS: 1 day    Time spent: 35 minutes. Greater than 50% of this time was spent in direct contact with the patient and patient's daughter, coordinating care and discussing relevant ongoing clinical issues, including significance of iron deficiency anemia likely leading to syncope, plan for GI consultation with likely endoscopic studies, need for chronic iron supplementation and blood transfusion this admission.     Lelon Frohlich, MD [facility] Pager [phone]  If 7PM-7AM, please contact night-coverage [email or web address] Password [facility] 06/21/2017, 2:54 PM

## 2017-06-21 NOTE — Progress Notes (Addendum)
Pt hg 6.4 from 7.9 1500 06/20/17 No signs of active bleeding. Dr Pryor Ochoa texted paged. Pt stable & resting see vitals flow sheet. Will continue to monitor pt's status.

## 2017-06-22 ENCOUNTER — Encounter (HOSPITAL_COMMUNITY)
Admission: EM | Disposition: A | Discharge status: Home Health | Payer: Self-pay | Source: Home / Self Care | Attending: Internal Medicine

## 2017-06-22 ENCOUNTER — Inpatient Hospital Stay (HOSPITAL_COMMUNITY): Payer: Medicare Other

## 2017-06-22 ENCOUNTER — Other Ambulatory Visit: Payer: Self-pay

## 2017-06-22 ENCOUNTER — Encounter (HOSPITAL_COMMUNITY): Payer: Self-pay | Admitting: *Deleted

## 2017-06-22 ENCOUNTER — Inpatient Hospital Stay (HOSPITAL_COMMUNITY): Payer: Medicare Other | Admitting: Anesthesiology

## 2017-06-22 DIAGNOSIS — K222 Esophageal obstruction: Secondary | ICD-10-CM

## 2017-06-22 DIAGNOSIS — I340 Nonrheumatic mitral (valve) insufficiency: Secondary | ICD-10-CM

## 2017-06-22 DIAGNOSIS — K3189 Other diseases of stomach and duodenum: Secondary | ICD-10-CM

## 2017-06-22 DIAGNOSIS — R1310 Dysphagia, unspecified: Secondary | ICD-10-CM

## 2017-06-22 HISTORY — PX: ESOPHAGEAL DILATION: SHX303

## 2017-06-22 HISTORY — PX: BIOPSY: SHX5522

## 2017-06-22 HISTORY — PX: ESOPHAGOGASTRODUODENOSCOPY (EGD) WITH PROPOFOL: SHX5813

## 2017-06-22 LAB — CBC
HEMATOCRIT: 29.9 % — AB (ref 36.0–46.0)
Hemoglobin: 9.9 g/dL — ABNORMAL LOW (ref 12.0–15.0)
MCH: 28.4 pg (ref 26.0–34.0)
MCHC: 33.1 g/dL (ref 30.0–36.0)
MCV: 85.7 fL (ref 78.0–100.0)
Platelets: 285 10*3/uL (ref 150–400)
RBC: 3.49 MIL/uL — ABNORMAL LOW (ref 3.87–5.11)
RDW: 14.8 % (ref 11.5–15.5)
WBC: 5.2 10*3/uL (ref 4.0–10.5)

## 2017-06-22 LAB — BASIC METABOLIC PANEL
Anion gap: 6 (ref 5–15)
BUN: 16 mg/dL (ref 6–20)
CO2: 21 mmol/L — ABNORMAL LOW (ref 22–32)
Calcium: 8.8 mg/dL — ABNORMAL LOW (ref 8.9–10.3)
Chloride: 109 mmol/L (ref 101–111)
Creatinine, Ser: 1.4 mg/dL — ABNORMAL HIGH (ref 0.44–1.00)
GFR calc Af Amer: 39 mL/min — ABNORMAL LOW (ref >60)
GFR, EST NON AFRICAN AMERICAN: 33 mL/min — AB (ref >60)
GLUCOSE: 90 mg/dL (ref 65–99)
POTASSIUM: 4.2 mmol/L (ref 3.5–5.1)
Sodium: 136 mmol/L (ref 135–145)

## 2017-06-22 LAB — BPAM RBC
BLOOD PRODUCT EXPIRATION DATE: 201906042359
Blood Product Expiration Date: 201905212359
ISSUE DATE / TIME: 201905151601
ISSUE DATE / TIME: 201905151926
UNIT TYPE AND RH: 5100
Unit Type and Rh: 1700

## 2017-06-22 LAB — TYPE AND SCREEN
ABO/RH(D): B POS
Antibody Screen: NEGATIVE
UNIT DIVISION: 0
Unit division: 0

## 2017-06-22 LAB — FOLATE RBC
FOLATE, RBC: 1304 ng/mL (ref >498)
Folate, Hemolysate: 323.4 ng/mL
HEMATOCRIT: 24.8 % — AB (ref 34.0–46.6)

## 2017-06-22 LAB — ECHOCARDIOGRAM COMPLETE
HEIGHTINCHES: 62 in
WEIGHTICAEL: 1901.25 [oz_av]

## 2017-06-22 SURGERY — ESOPHAGOGASTRODUODENOSCOPY (EGD) WITH PROPOFOL
Anesthesia: Monitor Anesthesia Care

## 2017-06-22 MED ORDER — PANTOPRAZOLE SODIUM 40 MG PO TBEC
40.0000 mg | DELAYED_RELEASE_TABLET | Freq: Every day | ORAL | Status: DC
  Administered 2017-06-22 – 2017-06-23 (×2): 40 mg via ORAL
  Filled 2017-06-22 (×3): qty 1

## 2017-06-22 MED ORDER — PROPOFOL 500 MG/50ML IV EMUL
INTRAVENOUS | Status: DC | PRN
  Administered 2017-06-22: 75 ug/kg/min via INTRAVENOUS

## 2017-06-22 MED ORDER — LABETALOL HCL 5 MG/ML IV SOLN
INTRAVENOUS | Status: DC | PRN
  Administered 2017-06-22: 5 mg via INTRAVENOUS

## 2017-06-22 MED ORDER — SODIUM CHLORIDE 0.9 % IV SOLN: INTRAVENOUS | Status: DC

## 2017-06-22 MED ORDER — LACTATED RINGERS IV SOLN
INTRAVENOUS | Status: DC
  Administered 2017-06-22: 10:00:00 via INTRAVENOUS

## 2017-06-22 NOTE — Progress Notes (Signed)
Patient seen in short stay. Discussed with anesthesia. Stable overnight. Her here for EGD with esophageal dilation, etc. as feasible/appropriate. Discussed at length with the patient's daughter.The risks, benefits, limitations, alternatives and imponderables have been reviewed with the patient. Potential for esophageal dilation, biopsy, etc. have also been reviewed.  Questions have been answered. All parties agreeable.  Of note, VQ scan yesterday technically limited but suggest a possible pulmonary embolus. Positive d-dimer. Further evaluation per attending.

## 2017-06-22 NOTE — Anesthesia Postprocedure Evaluation (Signed)
Anesthesia Post Note  Patient: Tina Kline  Procedure(s) Performed: ESOPHAGOGASTRODUODENOSCOPY (EGD) WITH PROPOFOL (N/A ) ESOPHAGEAL DILATION (N/A ) BIOPSY  Patient location during evaluation: PACU Anesthesia Type: MAC Level of consciousness: awake, patient cooperative and oriented (at baseline) Pain management: pain level controlled Vital Signs Assessment: post-procedure vital signs reviewed and stable Respiratory status: spontaneous breathing Cardiovascular status: stable Postop Assessment: no apparent nausea or vomiting Anesthetic complications: no     Last Vitals:  Vitals:   06/22/17 0854 06/22/17 1019  BP: 133/66 104/61  Pulse: 68 73  Resp: (!) 24 16  Temp: 36.7 C 36.8 C  SpO2: 98% 100%    Last Pain:  Vitals:   06/22/17 1019  TempSrc:   PainSc: (P) Asleep                 ADAMS, AMY A

## 2017-06-22 NOTE — Progress Notes (Signed)
PROGRESS NOTE    Tina Kline  UUV:[id number] DOB: 1929/10/18 DOA: 06/20/2017 PCP: [name], MD     Brief Narrative:  84 year old woman admitted from home on 5/14 following a syncopal episode the morning of admission.  Daughter states she  had just finished her breakfast had stood up to take her plate to the sink sat back down and suddenly her eyes rolled back and she slumped to the side.  Admission was requested for evaluation of syncope.   Assessment & Plan:   Principal Problem:   Syncope Active Problems:   IDA (iron deficiency anemia)   Syncopal episode -With hemoglobin of 6.4, I think it is safe to say that this is likely the etiology of her syncope. -MRI scan done as part of the syncopal work-up shows a 16 mm meningioma without midline shift, unlikely to be cause of syncopal episode, will need follow-up. -VQ scan was incomplete as patient did not tolerate ventilation portion , there were some perfusion deficits however radiology is unable to provide a probability rating.  For now, given inability to anticoagulate given severe anemia, will elect to withhold treatment, hopefully renal function will improve sufficiently in order to do a CT angiogram at some point.  In any case she has very low clinical probability for PE by Wells criteria and I would have, quite frankly, not have ordered a VQ scan to begin with.   Iron Deficiency Anemia -Hemoglobin 7.9 and admission, down to 6.4 on 5/15. -Was given 2 units of PRBCs and Hb today is 9.9. -No objective signs of bleeding during this hospitalization.  Daughter states that she has noted her mother's stools to be dark. -Patient denies dyspepsia. -Continue PPI once daily per GI recommendations. -EGD on 5/16 with gastric and duodenal erosions s/p biopsy. 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 -Ferritin is 6 which is classic iron deficiency anemia. -Was given IV Feraheme today on 5/15. Plan to DC on ferrous sulfate  supplementation BID.  Mild hyponatremia -Suspect due to hypovolemia, resolved with IV fluids.  Acute on chronic kidney disease stage -Baseline creatinine between 1.1 and 1.4. -Creatinine was 1.6 on admission, down to baseline of 1.4.   DVT prophylaxis: SCDs Code Status: Full code Family Communication: Daughter at bedside updated on plan of care and all questions answered on 5/15 Disposition Plan: home, anticipate in 24 hours  Consultants:   GI   Procedures:   EGD 5/16 as above  Antimicrobials:  Anti-infectives (From admission, onward)   None       Subjective: Seen prior to EGD this am. Feels well, no acute issues. No CP/SOB.  Objective: Vitals:   06/22/17 0854 06/22/17 1019 06/22/17 1030 06/22/17 1045  BP: 133/66 104/61 (!) 125/59   Pulse: 68 73 67   Resp: (!) 24 16 16    Temp: 98.1 F (36.7 C) 98.3 F (36.8 C)    TempSrc: Oral     SpO2: 98% 100% 100% 98%  Weight:      Height:        Intake/Output Summary (Last 24 hours) at 06/22/2017 1532 Last data filed at 06/22/2017 1021 Gross per 24 hour  Intake 2621 ml  Output 700 ml  Net 1921 ml   Filed Weights   06/20/17 2008 06/21/17 0512 06/22/17 0500  Weight: 52.3 kg (115 lb 4.8 oz) 52.8 kg (116 lb 6.4 oz) 53.9 kg (118 lb 13.3 oz)    Examination:  General exam: Alert, awake, oriented x 3 Respiratory system:  Clear to auscultation. Respiratory effort normal. Cardiovascular system:RRR. No murmurs, rubs, gallops. Gastrointestinal system: Abdomen is nondistended, soft and nontender. No organomegaly or masses felt. Normal bowel sounds heard. Central nervous system: Alert and oriented. No focal neurological deficits. Extremities: No C/C/E, +pedal pulses Skin: No rashes, lesions or ulcers Psychiatry: Judgement and insight appear normal. Mood & affect appropriate.      Data Reviewed: I have personally reviewed following labs and imaging studies  CBC: Recent Labs  Lab 06/20/17 1513 06/21/17 0143  06/22/17 0542  WBC 4.3 4.7 5.2  HGB 7.9* 6.4* 9.9*  HCT 24.8* 20.1* 29.9*  MCV 87.0 86.3 85.7  PLT 378 305 628   Basic Metabolic Panel: Recent Labs  Lab 06/20/17 1513 06/21/17 0143 06/22/17 0542  NA 131* 135 136  K 4.4 3.9 4.2  CL 102 107 109  CO2 22 21* 21*  GLUCOSE 132* 80 90  BUN 17 15 16   CREATININE 1.67* 1.42* 1.40*  CALCIUM 9.1 8.5* 8.8*   GFR: Estimated Creatinine Clearance: 23.2 mL/min (A) (by C-G formula based on SCr of 1.4 mg/dL (H)). Liver Function Tests: Recent Labs  Lab 06/21/17 0143  AST 12*  ALT 6*  ALKPHOS 79  BILITOT 0.5  PROT 5.6*  ALBUMIN 3.0*   No results for input(s): LIPASE, AMYLASE in the last 168 hours. No results for input(s): AMMONIA in the last 168 hours. Coagulation Profile: No results for input(s): INR, PROTIME in the last 168 hours. Cardiac Enzymes: Recent Labs  Lab 06/20/17 2020 06/21/17 0143 06/21/17 0825  CKTOTAL 55  --   --   CKMB 0.9  --   --   TROPONINI <0.03 <0.03 <0.03   BNP (last 3 results) No results for input(s): PROBNP in the last 8760 hours. HbA1C: No results for input(s): HGBA1C in the last 72 hours. CBG: Recent Labs  Lab 06/20/17 1259  GLUCAP 161*   Lipid Profile: No results for input(s): CHOL, HDL, LDLCALC, TRIG, CHOLHDL, LDLDIRECT in the last 72 hours. Thyroid Function Tests: Recent Labs    06/20/17 2023  TSH 1.303   Anemia Panel: Recent Labs    06/20/17 2023 06/21/17 0143 06/21/17 1508  VITAMINB12 214  --  237  FOLATE  --   --  13.7  FERRITIN 6*  --  5*  TIBC 435  --  371  IRON 20*  --  19*  RETICCTPCT  --  1.0  --    Urine analysis:    Component Value Date/Time   COLORURINE YELLOW 06/20/2017 1444   APPEARANCEUR CLEAR 06/20/2017 1444   LABSPEC 1.009 06/20/2017 1444   PHURINE 5.0 06/20/2017 1444   GLUCOSEU NEGATIVE 06/20/2017 1444   HGBUR NEGATIVE 06/20/2017 1444   BILIRUBINUR NEGATIVE 06/20/2017 1444   KETONESUR NEGATIVE 06/20/2017 1444   PROTEINUR NEGATIVE 06/20/2017 1444    UROBILINOGEN 0.2 01/15/2013 0425   NITRITE NEGATIVE 06/20/2017 1444   LEUKOCYTESUR TRACE (A) 06/20/2017 1444   Sepsis Labs: @LABRCNTIP (procalcitonin:4,lacticidven:4)  )No results found for this or any previous visit (from the past 240 hour(s)).       Radiology Studies: Ct Head Wo Contrast  Result Date: 06/20/2017 CLINICAL DATA:  Recurrent syncope, dementia EXAM: CT HEAD WITHOUT CONTRAST TECHNIQUE: Contiguous axial images were obtained from the base of the skull through the vertex without intravenous contrast. COMPARISON:  None. FINDINGS: Brain: Moderate atrophy with extensive chronic microvascular ischemia throughout the white matter. Negative for acute infarct or hemorrhage 1 cm partially calcified extra-axial mass in the left parietal region with skull  changes. There is some erosion of the parietal bone with sclerotic well-defined margins. No vasogenic edema in the adjacent brain. Findings most consistent with meningioma. Vascular: Negative for hyperdense vessel Skull: Chronic appearing bony changes with associated extra-axial calcified mass left parietal region. Negative for skull fracture Sinuses/Orbits: Negative Other: None IMPRESSION: Atrophy with extensive chronic microvascular ischemia. No acute infarct. 1 cm calcified extra-axial mass left parietal region with skull changes most consistent with meningioma. Electronically Signed   By: Franchot Gallo M.D.   On: 06/20/2017 17:26   Mr Brain Wo Contrast  Result Date: 06/21/2017 CLINICAL DATA:  Syncope.  Meningioma suspected on head CT. EXAM: MRI HEAD WITHOUT CONTRAST TECHNIQUE: Multiplanar, multiecho pulse sequences of the brain and surrounding structures were obtained without intravenous contrast. COMPARISON:  CT 06/20/2017 FINDINGS: Brain: Diffusion imaging does not show any acute or subacute infarction. There chronic small-vessel ischemic changes of the pons. No focal cerebellar insult. Cerebral hemispheres show advanced chronic  small-vessel ischemic changes affecting the hemispheric white matter with some involvement also of the thalami and basal ganglia. No cortical or large vessel territory infarction. No intra-axial mass lesion, hemorrhage, hydrocephalus or extra-axial collection. The examination confirms the presence of a 16 mm meningioma of the left parietal convexity without significant mass-effect upon the brain or any associated brain edema. Vascular: Major vessels at the base of the brain show flow. Skull and upper cervical spine: Negative Sinuses/Orbits: Sinuses are clear. Orbits are negative. Divergent gaze noted. Other: None significant IMPRESSION: No acute intracranial finding. Advanced chronic small-vessel ischemic changes throughout the brain as outlined above. 16 mm meningioma the left parietal convexity without significant mass-effect upon the brain. Electronically Signed   By: Nelson Chimes M.D.   On: 06/21/2017 08:29   Nm Pulmonary Perfusion  Result Date: 06/21/2017 CLINICAL DATA:  Elevated D-dimer, suspected pulmonary embolism EXAM: NUCLEAR MEDICINE PERFUSION LUNG SCAN TECHNIQUE: Perfusion images were obtained in multiple projections after intravenous injection of radiopharmaceutical. Patient was unable to tolerate ventilation exam. RADIOPHARMACEUTICALS:  4.1 mCi Tc-35m MAA IV COMPARISON:  None Correlation: Chest radiograph 06/21/2017 FINDINGS: Single moderate subsegmental perfusion defect at RIGHT lower lobe. Small subsegmental perfusion defects RIGHT upper lobe and RIGHT middle lobe. No LEFT lung perfusion defects identified. No ventilation images obtained. IMPRESSION: Single moderate subsegmental perfusion defect in RIGHT lower lobe with additional small subsegmental perfusion defects in RIGHT upper and RIGHT middle lobes. Clear chest radiograph at these locations. In the absence of ventilation exam, unable to provide a probability rating based on PIOPED II criteria. Observed perfusion defects could be caused by  multiple etiologies, including pulmonary emboli. Electronically Signed   By: Lavonia Dana M.D.   On: 06/21/2017 14:59   Dg Chest Port 1 View  Result Date: 06/21/2017 CLINICAL DATA:  Asthma, hypertension, correlation with V/Q scan EXAM: PORTABLE CHEST 1 VIEW COMPARISON:  Portable exam 1435 hours compared to 11/07/2014 FINDINGS: Normal heart size, mediastinal contours, and pulmonary vascularity. Skin folds project over LEFT chest. Lungs clear. No pleural effusion or pneumothorax. Bones demineralized with note of RIGHT AC joint degenerative changes. IMPRESSION: No acute abnormalities. Electronically Signed   By: Lavonia Dana M.D.   On: 06/21/2017 14:56        Scheduled Meds: . docusate sodium  200 mg Oral QHS  . donepezil  10 mg Oral QHS  . hydrALAZINE  25 mg Oral BID  . hydrochlorothiazide  25 mg Oral Daily  . levETIRAcetam  250 mg Oral BID  . lisinopril  40 mg Oral Daily  .  pantoprazole  40 mg Oral Daily  . spironolactone  25 mg Oral Daily   Continuous Infusions: . sodium chloride    . sodium chloride 75 mL/hr at 06/21/17 2244     LOS: 2 days    Time spent: 25 minutes.     Lelon Frohlich, MD [facility] Pager [phone]  If 7PM-7AM, please contact night-coverage [email or web address] Password [facility] 06/22/2017, 3:32 PM

## 2017-06-22 NOTE — Transfer of Care (Signed)
Immediate Anesthesia Transfer of Care Note  Patient: Tina Kline  Procedure(s) Performed: ESOPHAGOGASTRODUODENOSCOPY (EGD) WITH PROPOFOL (N/A ) ESOPHAGEAL DILATION (N/A ) BIOPSY  Patient Location: PACU  Anesthesia Type:MAC  Level of Consciousness: awake and patient cooperative  Airway & Oxygen Therapy: Patient Spontanous Breathing  Post-op Assessment: Report given to RN and Post -op Vital signs reviewed and stable  Post vital signs: Reviewed and stable  Last Vitals:  Vitals Value Taken Time  BP 104/61 06/22/2017 10:19 AM  Temp    Pulse 136 06/22/2017 10:21 AM  Resp 18 06/22/2017 10:21 AM  SpO2 88 % 06/22/2017 10:21 AM  Vitals shown include unvalidated device data.  Last Pain:  Vitals:   06/22/17 0947  TempSrc:   PainSc: 0-No pain         Complications: No apparent anesthesia complications

## 2017-06-22 NOTE — Op Note (Signed)
[facility] Patient Name: Tina Kline Procedure Date: 06/22/2017 9:32 AM MRN: [id number] Date of Birth: 03-17-1929 Attending MD: Norvel Richards , MD CSN: [id number] Age: 84 Admit Type: Inpatient Procedure:                Upper GI endoscopy Indications:              Dysphagia, Melena Providers:                Norvel Richards, MD, Jeanann Lewandowsky. Sharon Seller, RN,                            Randa Spike, Technician Referring MD:              Medicines:                Monitored Anesthesia Care Complications:            No immediate complications. Estimated Blood Loss:     Estimated blood loss was minimal. Procedure:                Pre-Anesthesia Assessment:                           - Prior to the procedure, a History and Physical                            was performed, and patient medications and                            allergies were reviewed. The patient's tolerance of                            previous anesthesia was also reviewed. The risks                            and benefits of the procedure and the sedation                            options and risks were discussed with the patient.                            All questions were answered, and informed consent                            was obtained. Prior Anticoagulants: The patient has                            taken no previous anticoagulant or antiplatelet                            agents. ASA Grade Assessment: III - A patient with                            severe systemic disease. After reviewing the risks  and benefits, the patient was deemed in                            satisfactory condition to undergo the procedure.                           After obtaining informed consent, the endoscope was                            passed under direct vision. Throughout the                            procedure, the patient's blood pressure, pulse, and                            oxygen  saturations were monitored continuously. The                            EG-[id number] ([id number]) scope was introduced through the                            and advanced to the second part of duodenum. The                            upper GI endoscopy was accomplished without                            difficulty. The patient tolerated the procedure                            well. 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 would not initially admit       scope.Although I could see the lumen downstream. I removed the scope and       passed a 8 Maloney to one third dilation. A look back revealed the web       had been nicely dilated. nicely. Schatzki's ring dilated with the scope       as the GE junction was passed.      A mild Schatzki ring was found at the gastroesophageal junction. gastric       and duodenal eroresent. No ultrating process. Gastric Biopsies taken.      A medium-sized hiatal hernia was present. 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 dilated (with scope)                           - Medium-sized hiatal hernia.                           - Gastric and duodenal ersoions?"status post gastric  biopsy Moderate Sedation:      Moderate (conscious) sedation was personally administered by an       anesthesia professional. The following parameters were monitored: oxygen       saturation, heart rate, blood pressure, respiratory rate, EKG, adequacy       of pulmonary ventilation, and response to care. Total physician       intraservice time was 18 minutes. Recommendation:           - Return patient to hospital ward for ongoing care.                           - Soft diet.                           - Continue present medications. Protonix 40 mg once                            daily  should suffice.                           - Repeat upper endoscopy in 3 months for                            retreatment. Patient would likely benefit from                            repeat (bougie) dilation of the entire esophagus                           - Return to GI clinic (date not yet determined). Procedure Code(s):        --- Professional ---                           [phone], Esophagogastroduodenoscopy, flexible,                            transoral; diagnostic, including collection of                            specimen(s) by brushing or washing, when performed                            (separate procedure) Diagnosis Code(s):        --- Professional ---                           Q39.4, Esophageal web                           K22.2, Esophageal obstruction                           K44.9, Diaphragmatic hernia without obstruction or                            gangrene CPT copyright 2017 American [employer] Association. All rights reserved. The codes documented in  this report are preliminary and upon coder review may  be revised to meet current compliance requirements. [name]. [name], MD Norvel Richards, MD 06/22/2017 10:27:41 AM This report has been signed electronically. Number of Addenda: 0

## 2017-06-22 NOTE — Progress Notes (Signed)
*  PRELIMINARY RESULTS* Echocardiogram 2D Echocardiogram has been performed.  Tina Kline 06/22/2017, 3:30 PM

## 2017-06-22 NOTE — Anesthesia Preprocedure Evaluation (Signed)
Anesthesia Evaluation  Patient identified by MRN, date of birth, ID band Patient awake    Reviewed: Allergy & Precautions, H&P , NPO status , Patient's Chart, lab work & pertinent test results  Airway Mallampati: II  TM Distance: >3 FB Neck ROM: full    Dental no notable dental hx.    Pulmonary neg pulmonary ROS,    Pulmonary exam normal breath sounds clear to auscultation       Cardiovascular Exercise Tolerance: Good hypertension, negative cardio ROS   Rhythm:regular Rate:Normal     Neuro/Psych PSYCHIATRIC DISORDERS Dementia negative neurological ROS  negative psych ROS   GI/Hepatic negative GI ROS, Neg liver ROS,   Endo/Other  negative endocrine ROS  Renal/GU Renal diseasenegative Renal ROS  negative genitourinary   Musculoskeletal   Abdominal   Peds  Hematology negative hematology ROS (+) anemia ,   Anesthesia Other Findings Meningioma P.E.  Reproductive/Obstetrics negative OB ROS                             Anesthesia Physical Anesthesia Plan  ASA: IV  Anesthesia Plan: MAC   Post-op Pain Management:    Induction:   PONV Risk Score and Plan:   Airway Management Planned:   Additional Equipment:   Intra-op Plan:   Post-operative Plan:   Informed Consent: I have reviewed the patients History and Physical, chart, labs and discussed the procedure including the risks, benefits and alternatives for the proposed anesthesia with the patient or authorized representative who has indicated his/her understanding and acceptance.   Dental Advisory Given  Plan Discussed with: CRNA  Anesthesia Plan Comments:         Anesthesia Quick Evaluation

## 2017-06-22 NOTE — Anesthesia Procedure Notes (Signed)
Procedure Name: MAC Date/Time: 06/22/2017 9:45 AM Performed by: Andree Elk Khristen Cheyney A, CRNA Pre-anesthesia Checklist: Patient identified, Emergency Drugs available, Suction available, Patient being monitored and Timeout performed Oxygen Delivery Method: Nasal cannula

## 2017-06-22 NOTE — Plan of Care (Signed)
No anxiety noted

## 2017-06-23 DIAGNOSIS — D500 Iron deficiency anemia secondary to blood loss (chronic): Secondary | ICD-10-CM

## 2017-06-23 LAB — BASIC METABOLIC PANEL
ANION GAP: 7 (ref 5–15)
BUN: 12 mg/dL (ref 6–20)
CALCIUM: 8.9 mg/dL (ref 8.9–10.3)
CHLORIDE: 109 mmol/L (ref 101–111)
CO2: 20 mmol/L — ABNORMAL LOW (ref 22–32)
CREATININE: 1.36 mg/dL — AB (ref 0.44–1.00)
GFR calc Af Amer: 40 mL/min — ABNORMAL LOW (ref >60)
GFR calc non Af Amer: 34 mL/min — ABNORMAL LOW (ref >60)
GLUCOSE: 82 mg/dL (ref 65–99)
Potassium: 4.1 mmol/L (ref 3.5–5.1)
Sodium: 136 mmol/L (ref 135–145)

## 2017-06-23 LAB — CBC
HEMATOCRIT: 33 % — AB (ref 36.0–46.0)
HEMOGLOBIN: 10.7 g/dL — AB (ref 12.0–15.0)
MCH: 28.1 pg (ref 26.0–34.0)
MCHC: 32.4 g/dL (ref 30.0–36.0)
MCV: 86.6 fL (ref 78.0–100.0)
Platelets: 262 10*3/uL (ref 150–400)
RBC: 3.81 MIL/uL — ABNORMAL LOW (ref 3.87–5.11)
RDW: 14.9 % (ref 11.5–15.5)
WBC: 5.7 10*3/uL (ref 4.0–10.5)

## 2017-06-23 MED ORDER — FERROUS SULFATE 325 (65 FE) MG PO TBEC: 325.0000 mg | DELAYED_RELEASE_TABLET | Freq: Two times a day (BID) | ORAL | 3 refills | Status: DC

## 2017-06-23 MED ORDER — PANTOPRAZOLE SODIUM 40 MG PO TBEC: 40.0000 mg | DELAYED_RELEASE_TABLET | Freq: Every day | ORAL | 2 refills | Status: DC

## 2017-06-23 MED ORDER — LEVETIRACETAM 250 MG PO TABS: 250.0000 mg | ORAL_TABLET | Freq: Two times a day (BID) | ORAL | 2 refills | Status: DC

## 2017-06-23 NOTE — Discharge Summary (Addendum)
Physician Discharge Summary  Tina Kline NOI:[id number] DOB: April 05, 1929 DOA: 06/20/2017  PCP: [name], MD  Admit date: 06/20/2017 Discharge date: 06/23/2017  Time spent: 45 minutes  Recommendations for Outpatient Follow-up:  -To be discharged home today. -Advised close follow-up with PCP in 2 weeks. -Also follow-up with Dr. Merlene Laughter in about 4 weeks.  Discharge Diagnoses:  Principal Problem:   Syncope Active Problems:   IDA (iron deficiency anemia)   Discharge Condition: Stable and improved  Filed Weights   06/21/17 0512 06/22/17 0500 06/23/17 0446  Weight: 52.8 kg (116 lb 6.4 oz) 53.9 kg (118 lb 13.3 oz) 51.4 kg (113 lb 5.1 oz)    History of present illness:  As per Dr. Maudie Mercury on 5/14:  Tina Kline  is a 84 y.o. female, w hypertension, dementia, c/o syncope at the supper table for about 58minutes.  This was a witnessed event by family and her right arm was shaking. No tongue lac, no incontinence.   Pt denies pre syncope symptoms.  Denies cp, palp, sob, n/v, diarrhea, brbpr, black stool.    In Ed,  CT brain  IMPRESSION: Atrophy with extensive chronic microvascular ischemia. No acute infarct.  1 cm calcified extra-axial mass left parietal region with skull changes most consistent with meningioma.  Na 131, K 4.4, Bun 17, Creatinine 1.67  Urinalysis negative  Wbc 4.3, Hgb 7.9, Plt 378  FOBT negative  Pt will be admitted for syncope ? Seizure and worsening anemia.       Hospital Course:   Syncopal episode -With hemoglobin of 6.4, I think it is safe to say that this is likely the etiology of her syncope. -MRI scan done as part of the syncopal work-up shows a 16 mm meningioma without midline shift, unlikely to be cause of syncopal episode, will need follow-up.  Neurology has seen her and is recommending initiating Keppra for seizure prophylaxis given her meningioma and possibility that syncopal episode could represent a partial complex  seizure. -VQ scan was incomplete as patient did not tolerate ventilation portion , there were some perfusion deficits however radiology is unable to provide a probability rating.  For now, given inability to anticoagulate given severe anemia, will elect to withhold treatment.  She has chronic kidney disease with a baseline creatinine around 1.4, I do not believe  it is safe to do a CT angiogram to definitively rule out her in PE. In any case she has very low clinical probability for PE by Wells criteria and I would have, quite frankly, not have ordered a VQ scan to begin with.   Iron Deficiency Anemia -Hemoglobin 7.9 and admission, down to 6.4 on 5/15. -Was given 2 units of PRBCs and Hb is up to 10.7 on day of discharge.. -No objective signs of bleeding during this hospitalization.  Daughter states that she has noted her mother's stools to be dark. -Patient denies dyspepsia. -Continue PPI once daily per GI recommendations. -EGD on 5/16 with gastric and duodenal erosions s/p biopsy. 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 -Ferritin is 6 which is classic iron deficiency anemia. -Was given IV Feraheme today on 5/15. Plan to DC on ferrous sulfate supplementation BID.  Mild hyponatremia -Suspect due to hypovolemia, resolved with IV fluids.  Acute on chronic kidney disease stage III -Baseline creatinine between 1.1 and 1.4. -Creatinine was 1.6 on admission, down to baseline of 1.4.      Procedures:  EGD on 5/16 as above   Consultations:  GI  Discharge Instructions  Discharge Instructions    Diet - low sodium heart healthy   Complete by:  As directed    Increase activity slowly   Complete by:  As directed      Allergies as of 06/23/2017   No Known Allergies     Medication List    STOP taking these medications   omeprazole 20 MG capsule Commonly known as:  PRILOSEC     TAKE these medications   donepezil 10 MG tablet Commonly known as:  ARICEPT Take 10  mg by mouth at bedtime.   ferrous sulfate 325 (65 FE) MG EC tablet Take 1 tablet (325 mg total) by mouth 2 (two) times daily.   hydrALAZINE 25 MG tablet Commonly known as:  APRESOLINE Take 25 mg by mouth 2 (two) times daily.   hydrochlorothiazide 25 MG tablet Commonly known as:  HYDRODIURIL Take 25 mg by mouth daily.   levETIRAcetam 250 MG tablet Commonly known as:  KEPPRA Take 1 tablet (250 mg total) by mouth 2 (two) times daily.   lisinopril 40 MG tablet Commonly known as:  PRINIVIL,ZESTRIL Take 40 mg by mouth daily.   pantoprazole 40 MG tablet Commonly known as:  PROTONIX Take 1 tablet (40 mg total) by mouth daily. Start taking on:  06/24/2017   spironolactone 25 MG tablet Commonly known as:  ALDACTONE Take 25 mg by mouth daily.      No Known Allergies Follow-up Information    [name], MD. Schedule an appointment as soon as possible for a visit in 2 week(s).   Specialty:  Internal Medicine Contact information: [place] [address] [phone]        Phillips Odor, MD. Schedule an appointment as soon as possible for a visit in 1 month(s).   Specialty:  Neurology Contact information: [address] [place] [address] [phone]            The results of significant diagnostics from this hospitalization (including imaging, microbiology, ancillary and laboratory) are listed below for reference.    Significant Diagnostic Studies: Ct Head Wo Contrast  Result Date: 06/20/2017 CLINICAL DATA:  Recurrent syncope, dementia EXAM: CT HEAD WITHOUT CONTRAST TECHNIQUE: Contiguous axial images were obtained from the base of the skull through the vertex without intravenous contrast. COMPARISON:  None. FINDINGS: Brain: Moderate atrophy with extensive chronic microvascular ischemia throughout the white matter. Negative for acute infarct or hemorrhage 1 cm partially calcified extra-axial mass in the left parietal region with skull  changes. There is some erosion of the parietal bone with sclerotic well-defined margins. No vasogenic edema in the adjacent brain. Findings most consistent with meningioma. Vascular: Negative for hyperdense vessel Skull: Chronic appearing bony changes with associated extra-axial calcified mass left parietal region. Negative for skull fracture Sinuses/Orbits: Negative Other: None IMPRESSION: Atrophy with extensive chronic microvascular ischemia. No acute infarct. 1 cm calcified extra-axial mass left parietal region with skull changes most consistent with meningioma. Electronically Signed   By: Franchot Gallo M.D.   On: 06/20/2017 17:26   Mr Brain Wo Contrast  Result Date: 06/21/2017 CLINICAL DATA:  Syncope.  Meningioma suspected on head CT. EXAM: MRI HEAD WITHOUT CONTRAST TECHNIQUE: Multiplanar, multiecho pulse sequences of the brain and surrounding structures were obtained without intravenous contrast. COMPARISON:  CT 06/20/2017 FINDINGS: Brain: Diffusion imaging does not show any acute or subacute infarction. There chronic small-vessel ischemic changes of the pons. No focal cerebellar insult. Cerebral hemispheres show advanced chronic small-vessel ischemic changes affecting the  hemispheric white matter with some involvement also of the thalami and basal ganglia. No cortical or large vessel territory infarction. No intra-axial mass lesion, hemorrhage, hydrocephalus or extra-axial collection. The examination confirms the presence of a 16 mm meningioma of the left parietal convexity without significant mass-effect upon the brain or any associated brain edema. Vascular: Major vessels at the base of the brain show flow. Skull and upper cervical spine: Negative Sinuses/Orbits: Sinuses are clear. Orbits are negative. Divergent gaze noted. Other: None significant IMPRESSION: No acute intracranial finding. Advanced chronic small-vessel ischemic changes throughout the brain as outlined above. 16 mm meningioma the left  parietal convexity without significant mass-effect upon the brain. Electronically Signed   By: Nelson Chimes M.D.   On: 06/21/2017 08:29   Nm Pulmonary Perfusion  Result Date: 06/21/2017 CLINICAL DATA:  Elevated D-dimer, suspected pulmonary embolism EXAM: NUCLEAR MEDICINE PERFUSION LUNG SCAN TECHNIQUE: Perfusion images were obtained in multiple projections after intravenous injection of radiopharmaceutical. Patient was unable to tolerate ventilation exam. RADIOPHARMACEUTICALS:  4.1 mCi Tc-78m MAA IV COMPARISON:  None Correlation: Chest radiograph 06/21/2017 FINDINGS: Single moderate subsegmental perfusion defect at RIGHT lower lobe. Small subsegmental perfusion defects RIGHT upper lobe and RIGHT middle lobe. No LEFT lung perfusion defects identified. No ventilation images obtained. IMPRESSION: Single moderate subsegmental perfusion defect in RIGHT lower lobe with additional small subsegmental perfusion defects in RIGHT upper and RIGHT middle lobes. Clear chest radiograph at these locations. In the absence of ventilation exam, unable to provide a probability rating based on PIOPED II criteria. Observed perfusion defects could be caused by multiple etiologies, including pulmonary emboli. Electronically Signed   By: Lavonia Dana M.D.   On: 06/21/2017 14:59   US Carotid Bilateral  Result Date: 06/01/2017 CLINICAL DATA:  Syncope, hypertension EXAM: BILATERAL CAROTID DUPLEX ULTRASOUND TECHNIQUE: Pearline Cables scale imaging, color Doppler and duplex ultrasound were performed of bilateral carotid and vertebral arteries in the neck. COMPARISON:  None. FINDINGS: Criteria: Quantification of carotid stenosis is based on velocity parameters that correlate the residual internal carotid diameter with NASCET-based stenosis levels, using the diameter of the distal internal carotid lumen as the denominator for stenosis measurement. The following velocity measurements were obtained: RIGHT ICA: 86/20 cm/sec CCA: 427/06 cm/sec SYSTOLIC  ICA/CCA RATIO:  0.8 ECA:  65 cm/sec LEFT ICA: 92/24 cm/sec CCA: 23/7 cm/sec SYSTOLIC ICA/CCA RATIO:  1.4 ECA:  99 cm/sec RIGHT CAROTID ARTERY: Mild echogenic shadowing plaque formation. No hemodynamically significant right ICA stenosis, velocity elevation, or turbulent flow. Degree of narrowing less than 50%. RIGHT VERTEBRAL ARTERY:  Antegrade LEFT CAROTID ARTERY: Similar scattered mild echogenic plaque formation. No hemodynamically significant left ICA stenosis, velocity elevation, or turbulent flow. LEFT VERTEBRAL ARTERY:  Antegrade IMPRESSION: Mild bilateral carotid atherosclerosis. No hemodynamically significant ICA stenosis. Degree of narrowing less than 50% bilaterally by ultrasound criteria. Patent antegrade vertebral flow bilaterally Electronically Signed   By: Jerilynn Mages.  Shick M.D.   On: 06/01/2017 14:44   Dg Chest Port 1 View  Result Date: 06/21/2017 CLINICAL DATA:  Asthma, hypertension, correlation with V/Q scan EXAM: PORTABLE CHEST 1 VIEW COMPARISON:  Portable exam 1435 hours compared to 11/07/2014 FINDINGS: Normal heart size, mediastinal contours, and pulmonary vascularity. Skin folds project over LEFT chest. Lungs clear. No pleural effusion or pneumothorax. Bones demineralized with note of RIGHT AC joint degenerative changes. IMPRESSION: No acute abnormalities. Electronically Signed   By: Lavonia Dana M.D.   On: 06/21/2017 14:56    Microbiology: No results found for this or any previous visit (from the  past 240 hour(s)).   Labs: Basic Metabolic Panel: Recent Labs  Lab 06/20/17 1513 06/21/17 0143 06/22/17 0542 06/23/17 0841  NA 131* 135 136 136  K 4.4 3.9 4.2 4.1  CL 102 107 109 109  CO2 22 21* 21* 20*  GLUCOSE 132* 80 90 82  BUN 17 15 16 12   CREATININE 1.67* 1.42* 1.40* 1.36*  CALCIUM 9.1 8.5* 8.8* 8.9   Liver Function Tests: Recent Labs  Lab 06/21/17 0143  AST 12*  ALT 6*  ALKPHOS 79  BILITOT 0.5  PROT 5.6*  ALBUMIN 3.0*   No results for input(s): LIPASE, AMYLASE in the  last 168 hours. No results for input(s): AMMONIA in the last 168 hours. CBC: Recent Labs  Lab 06/20/17 1513 06/20/17 2023 06/21/17 0143 06/22/17 0542 06/23/17 0841  WBC 4.3  --  4.7 5.2 5.7  HGB 7.9*  --  6.4* 9.9* 10.7*  HCT 24.8* 24.8* 20.1* 29.9* 33.0*  MCV 87.0  --  86.3 85.7 86.6  PLT 378  --  305 285 262   Cardiac Enzymes: Recent Labs  Lab 06/20/17 2020 06/21/17 0143 06/21/17 0825  CKTOTAL 55  --   --   CKMB 0.9  --   --   TROPONINI <0.03 <0.03 <0.03   BNP: BNP (last 3 results) No results for input(s): BNP in the last 8760 hours.  ProBNP (last 3 results) No results for input(s): PROBNP in the last 8760 hours.  CBG: Recent Labs  Lab 06/20/17 1259  GLUCAP 161*       Signed:  Lelon Frohlich  [facility] Pager: [phone] 06/23/2017, 11:40 AM

## 2017-06-23 NOTE — Care Management Important Message (Signed)
Important Message  Patient Details  Name: JULITA OZBUN MRN: [id number] Date of Birth: 1929/04/26   Medicare Important Message Given:  Yes    Shelda Altes 06/23/2017, 11:02 AM

## 2017-06-23 NOTE — Evaluation (Signed)
Physical Therapy Evaluation Patient Details Name: Tina Kline MRN: [id number] DOB: 1929-09-03 Today's Date: 06/23/2017   History of Present Illness  Tina Kline  is a 84 y.o. female, w hypertension, dementia, c/o syncope at the supper table for about 27mnutes.  This was a witnessed event by family and her right arm was shaking. No tongue lac, no incontinence.   Pt denies pre syncope symptoms.  Denies cp, palp, sob, n/v, diarrhea, brbpr, black stool.      Clinical Impression  Patient functioning near baseline for functional mobility and gait, other than slightly labored slower than normal cadence during ambulation, limited arm swing possibly due to guarding, no loss of balance and limited secondary to mild fatigue.  Plan:  Patient to be discharged home today and discharged from physical therapy to care of nursing for ambulation as tolerated for length of stay.     Follow Up Recommendations Home health PT;Supervision for mobility/OOB    Equipment Recommendations  None recommended by PT    Recommendations for Other Services       Precautions / Restrictions Precautions Precautions: Fall Restrictions Weight Bearing Restrictions: No      Mobility  Bed Mobility Overal bed mobility: Modified Independent                Transfers Overall transfer level: Needs assistance Equipment used: None Transfers: Sit to/from Stand;Stand Pivot Transfers Sit to Stand: Supervision Stand pivot transfers: Supervision          Ambulation/Gait Ambulation/Gait assistance: Supervision Ambulation Distance (Feet): 80 Feet Assistive device: None Gait Pattern/deviations: Decreased step length - right;Decreased step length - left;Decreased stride length Gait velocity: decreased   General Gait Details: demonstrates slightly labored slow cadence without loss of balance, limited armswing, slightly improvement after VC's  Stairs            Wheelchair Mobility    Modified Rankin  (Stroke Patients Only)       Balance Overall balance assessment: Mild deficits observed, not formally tested                                           Pertinent Vitals/Pain Pain Assessment: No/denies pain    Home Living Family/patient expects to be discharged to:: Private residence Living Arrangements: Children Available Help at Discharge: Family Type of Home: House Home Access: Ramped entrance     Home Layout: One level Home Equipment: None      Prior Function Level of Independence: Needs assistance   Gait / Transfers Assistance Needed: household gait without assistive device  ADL's / Homemaking Assistance Needed: assisted by her daughter        Hand Dominance        Extremity/Trunk Assessment   Upper Extremity Assessment Upper Extremity Assessment: Overall WFL for tasks assessed    Lower Extremity Assessment Lower Extremity Assessment: Overall WFL for tasks assessed    Cervical / Trunk Assessment Cervical / Trunk Assessment: Normal  Communication   Communication: No difficulties  Cognition Arousal/Alertness: Awake/alert Behavior During Therapy: WFL for tasks assessed/performed Overall Cognitive Status: Within Functional Limits for tasks assessed                                        General Comments      Exercises  Assessment/Plan    PT Assessment All further PT needs can be met in the next venue of care  PT Problem List Decreased strength;Decreased activity tolerance;Decreased mobility       PT Treatment Interventions      PT Goals (Current goals can be found in the Care Plan section)  Acute Rehab PT Goals Patient Stated Goal: return home with family to assist PT Goal Formulation: With patient/family Time For Goal Achievement: 2017-07-03 Potential to Achieve Goals: Good    Frequency     Barriers to discharge        Co-evaluation               AM-PAC PT "6 Clicks" Daily Activity   Outcome Measure Difficulty turning over in bed (including adjusting bedclothes, sheets and blankets)?: None Difficulty moving from lying on back to sitting on the side of the bed? : None Difficulty sitting down on and standing up from a chair with arms (e.g., wheelchair, bedside commode, etc,.)?: None Help needed moving to and from a bed to chair (including a wheelchair)?: A Little Help needed walking in hospital room?: A Little Help needed climbing 3-5 steps with a railing? : A Little 6 Click Score: 21    End of Session   Activity Tolerance: Patient tolerated treatment well Patient left: in bed;with call bell/phone within reach;with family/visitor present Nurse Communication: Mobility status PT Visit Diagnosis: Unsteadiness on feet (R26.81);Other abnormalities of gait and mobility (R26.89);Muscle weakness (generalized) (M62.81)    Time: 2863-8177 PT Time Calculation (min) (ACUTE ONLY): 24 min   Charges:   PT Evaluation $PT Eval Moderate Complexity: 1 Mod PT Treatments $Therapeutic Activity: 23-37 mins   PT G Codes:        2:22 PM, 07-03-2017 Lonell Grandchild, MPT Physical Therapist with [facility] [phone] office [phone] mobile phone

## 2017-06-23 NOTE — Progress Notes (Signed)
Discharge instructions gone over with daughter and patient, verbalized understanding. IV removed, patient tolerated procedure well. Printed prescription given to patient. Attempted to call and make follow up appointment with Dr. Merlene Laughter for patient, no answer at office. Reported this to patient's daughter, she agreed to try to call again on Monday.

## 2017-06-23 NOTE — Care Management Note (Signed)
Case Management Note  Patient Details  Name: CINCERE DEPREY MRN: [id number] Date of Birth: 08-25-1929    Expected Discharge Date:  06/23/17               Expected Discharge Plan:  [place]  In-House Referral:     Discharge planning Services  CM Consult  Post Acute Care Choice:  Home Health Choice offered to:  Patient, Adult Children  DME Arranged:    DME Agency:     HH Arranged:  PT [place]:  [place]  Status of Service:  Completed, signed off  If discussed at [place] of Stay Meetings, dates discussed:    Additional Comments: Patient discharging home today. Recommended for HH PT. Agreeable. No preference of agency. Juliann Pulse of [facility] notified and will obtain orders via Epic.   Marki Frede, Chauncey Reading, RN 06/23/2017, 12:02 PM

## 2017-06-23 NOTE — Discharge Instructions (Signed)
Advised close follow-up with PCP in 2 weeks. Also follow-up with Dr. Merlene Laughter in about 4 weeks.

## 2017-06-25 ENCOUNTER — Encounter: Payer: Self-pay | Admitting: Internal Medicine

## 2017-06-26 ENCOUNTER — Encounter (HOSPITAL_COMMUNITY): Payer: Self-pay | Admitting: Internal Medicine

## 2017-06-27 ENCOUNTER — Telehealth: Payer: Self-pay

## 2017-06-27 ENCOUNTER — Encounter: Payer: Self-pay | Admitting: Gastroenterology

## 2017-06-27 DIAGNOSIS — N189 Chronic kidney disease, unspecified: Secondary | ICD-10-CM | POA: Diagnosis not present

## 2017-06-27 DIAGNOSIS — D509 Iron deficiency anemia, unspecified: Secondary | ICD-10-CM | POA: Diagnosis not present

## 2017-06-27 DIAGNOSIS — D320 Benign neoplasm of cerebral meninges: Secondary | ICD-10-CM | POA: Diagnosis not present

## 2017-06-27 DIAGNOSIS — K222 Esophageal obstruction: Secondary | ICD-10-CM | POA: Diagnosis not present

## 2017-06-27 DIAGNOSIS — K449 Diaphragmatic hernia without obstruction or gangrene: Secondary | ICD-10-CM | POA: Diagnosis not present

## 2017-06-27 DIAGNOSIS — I129 Hypertensive chronic kidney disease with stage 1 through stage 4 chronic kidney disease, or unspecified chronic kidney disease: Secondary | ICD-10-CM | POA: Diagnosis not present

## 2017-06-27 DIAGNOSIS — F0390 Unspecified dementia without behavioral disturbance: Secondary | ICD-10-CM | POA: Diagnosis not present

## 2017-06-27 LAB — MULTIPLE MYELOMA PANEL, SERUM
ALBUMIN/GLOB SERPL: 1.2 (ref 0.7–1.7)
Albumin SerPl Elph-Mcnc: 3.5 g/dL (ref 2.9–4.4)
Alpha 1: 0.2 g/dL (ref 0.0–0.4)
Alpha2 Glob SerPl Elph-Mcnc: 0.8 g/dL (ref 0.4–1.0)
B-Globulin SerPl Elph-Mcnc: 1 g/dL (ref 0.7–1.3)
Gamma Glob SerPl Elph-Mcnc: 1 g/dL (ref 0.4–1.8)
Globulin, Total: 3.1 g/dL (ref 2.2–3.9)
IGA: 272 mg/dL (ref 64–422)
IGM (IMMUNOGLOBULIN M), SRM: 162 mg/dL (ref 26–217)
IgG (Immunoglobin G), Serum: 1105 mg/dL (ref 700–1600)
M Protein SerPl Elph-Mcnc: 0.3 g/dL — ABNORMAL HIGH
TOTAL PROTEIN ELP: 6.6 g/dL (ref 6.0–8.5)

## 2017-06-27 NOTE — Telephone Encounter (Signed)
Per RMR- Send letter to patient.  Send copy of letter with path to referring provider and PCP.  Patient needs PrevPak or generic equivalent x 14 days--hold any acid suppression and/or statin therapy patient may be taking during treatment. So, hold protonix and will prescribe prevpak; resume Protonix after Prevpak complete.   Needs OV in 3 mos if not already scheduled.

## 2017-06-27 NOTE — Telephone Encounter (Signed)
OV made and letter mailed °

## 2017-06-28 DIAGNOSIS — D320 Benign neoplasm of cerebral meninges: Secondary | ICD-10-CM | POA: Diagnosis not present

## 2017-06-28 DIAGNOSIS — D509 Iron deficiency anemia, unspecified: Secondary | ICD-10-CM | POA: Diagnosis not present

## 2017-06-28 DIAGNOSIS — N189 Chronic kidney disease, unspecified: Secondary | ICD-10-CM | POA: Diagnosis not present

## 2017-06-28 DIAGNOSIS — I129 Hypertensive chronic kidney disease with stage 1 through stage 4 chronic kidney disease, or unspecified chronic kidney disease: Secondary | ICD-10-CM | POA: Diagnosis not present

## 2017-06-28 DIAGNOSIS — K449 Diaphragmatic hernia without obstruction or gangrene: Secondary | ICD-10-CM | POA: Diagnosis not present

## 2017-06-28 DIAGNOSIS — F0390 Unspecified dementia without behavioral disturbance: Secondary | ICD-10-CM | POA: Diagnosis not present

## 2017-06-29 DIAGNOSIS — N183 Chronic kidney disease, stage 3 (moderate): Secondary | ICD-10-CM | POA: Diagnosis not present

## 2017-06-29 DIAGNOSIS — D509 Iron deficiency anemia, unspecified: Secondary | ICD-10-CM | POA: Diagnosis not present

## 2017-06-29 DIAGNOSIS — K2980 Duodenitis without bleeding: Secondary | ICD-10-CM | POA: Diagnosis not present

## 2017-06-29 DIAGNOSIS — R739 Hyperglycemia, unspecified: Secondary | ICD-10-CM | POA: Diagnosis not present

## 2017-06-29 DIAGNOSIS — R55 Syncope and collapse: Secondary | ICD-10-CM | POA: Diagnosis not present

## 2017-06-29 DIAGNOSIS — E559 Vitamin D deficiency, unspecified: Secondary | ICD-10-CM | POA: Diagnosis not present

## 2017-06-29 DIAGNOSIS — D649 Anemia, unspecified: Secondary | ICD-10-CM | POA: Diagnosis not present

## 2017-06-29 DIAGNOSIS — E785 Hyperlipidemia, unspecified: Secondary | ICD-10-CM | POA: Diagnosis not present

## 2017-06-29 DIAGNOSIS — Z0000 Encounter for general adult medical examination without abnormal findings: Secondary | ICD-10-CM | POA: Diagnosis not present

## 2017-06-29 DIAGNOSIS — I10 Essential (primary) hypertension: Secondary | ICD-10-CM | POA: Diagnosis not present

## 2017-06-29 DIAGNOSIS — R634 Abnormal weight loss: Secondary | ICD-10-CM | POA: Diagnosis not present

## 2017-06-29 NOTE — Telephone Encounter (Signed)
Spoke with pts daughter and she is aware of the medications that pt needs to hold until finished with the prevpak.  [place] and they are going to do the generic medications so insurance can cover it. They will crush the tabs that can be crushed per pts daughters request.   Pt has an appointment scheduled for 3 month return.

## 2017-06-30 DIAGNOSIS — K449 Diaphragmatic hernia without obstruction or gangrene: Secondary | ICD-10-CM | POA: Diagnosis not present

## 2017-06-30 DIAGNOSIS — F0390 Unspecified dementia without behavioral disturbance: Secondary | ICD-10-CM | POA: Diagnosis not present

## 2017-06-30 DIAGNOSIS — I129 Hypertensive chronic kidney disease with stage 1 through stage 4 chronic kidney disease, or unspecified chronic kidney disease: Secondary | ICD-10-CM | POA: Diagnosis not present

## 2017-06-30 DIAGNOSIS — D509 Iron deficiency anemia, unspecified: Secondary | ICD-10-CM | POA: Diagnosis not present

## 2017-06-30 DIAGNOSIS — D320 Benign neoplasm of cerebral meninges: Secondary | ICD-10-CM | POA: Diagnosis not present

## 2017-06-30 DIAGNOSIS — N189 Chronic kidney disease, unspecified: Secondary | ICD-10-CM | POA: Diagnosis not present

## 2017-07-04 DIAGNOSIS — D320 Benign neoplasm of cerebral meninges: Secondary | ICD-10-CM | POA: Diagnosis not present

## 2017-07-04 DIAGNOSIS — D509 Iron deficiency anemia, unspecified: Secondary | ICD-10-CM | POA: Diagnosis not present

## 2017-07-04 DIAGNOSIS — K449 Diaphragmatic hernia without obstruction or gangrene: Secondary | ICD-10-CM | POA: Diagnosis not present

## 2017-07-04 DIAGNOSIS — F0390 Unspecified dementia without behavioral disturbance: Secondary | ICD-10-CM | POA: Diagnosis not present

## 2017-07-04 DIAGNOSIS — N189 Chronic kidney disease, unspecified: Secondary | ICD-10-CM | POA: Diagnosis not present

## 2017-07-04 DIAGNOSIS — I129 Hypertensive chronic kidney disease with stage 1 through stage 4 chronic kidney disease, or unspecified chronic kidney disease: Secondary | ICD-10-CM | POA: Diagnosis not present

## 2017-07-06 DIAGNOSIS — N189 Chronic kidney disease, unspecified: Secondary | ICD-10-CM | POA: Diagnosis not present

## 2017-07-06 DIAGNOSIS — D320 Benign neoplasm of cerebral meninges: Secondary | ICD-10-CM | POA: Diagnosis not present

## 2017-07-06 DIAGNOSIS — I129 Hypertensive chronic kidney disease with stage 1 through stage 4 chronic kidney disease, or unspecified chronic kidney disease: Secondary | ICD-10-CM | POA: Diagnosis not present

## 2017-07-06 DIAGNOSIS — F0390 Unspecified dementia without behavioral disturbance: Secondary | ICD-10-CM | POA: Diagnosis not present

## 2017-07-06 DIAGNOSIS — K449 Diaphragmatic hernia without obstruction or gangrene: Secondary | ICD-10-CM | POA: Diagnosis not present

## 2017-07-06 DIAGNOSIS — D509 Iron deficiency anemia, unspecified: Secondary | ICD-10-CM | POA: Diagnosis not present

## 2017-07-10 DIAGNOSIS — D509 Iron deficiency anemia, unspecified: Secondary | ICD-10-CM | POA: Diagnosis not present

## 2017-07-18 DIAGNOSIS — Z79899 Other long term (current) drug therapy: Secondary | ICD-10-CM | POA: Diagnosis not present

## 2017-07-18 DIAGNOSIS — I10 Essential (primary) hypertension: Secondary | ICD-10-CM | POA: Diagnosis not present

## 2017-07-18 DIAGNOSIS — G40219 Localization-related (focal) (partial) symptomatic epilepsy and epileptic syndromes with complex partial seizures, intractable, without status epilepticus: Secondary | ICD-10-CM | POA: Diagnosis not present

## 2017-07-18 DIAGNOSIS — F0150 Vascular dementia without behavioral disturbance: Secondary | ICD-10-CM | POA: Diagnosis not present

## 2017-08-17 ENCOUNTER — Telehealth: Payer: Self-pay

## 2017-08-17 NOTE — Telephone Encounter (Signed)
This pt has appt with Walden Field, NP on 09/27/2017 at 10:30 AM.  I have just received CBC report ( ordered by Dr. Legrand Rams) and collected on 06/29/2017. Pt's hemoglobin was 9.6 and Hematocrit was 29.0. PT had a blood transfusion on 06/21/2017. FYI to Randall Hiss ( Dr. Nona Dell is on vacation).  Placing lab on his desk for review and any recommendations.

## 2017-08-18 NOTE — Telephone Encounter (Signed)
Please call the patient Monday and ask if any obvious GI bleed.  Recheck CBC in 1 week.  If any obvious GI bleed or if any worsening weakness, fatigue, dyspnea, chest pain, dizziness, passing out, etc then call us and/or proceed to the ER.

## 2017-08-21 ENCOUNTER — Other Ambulatory Visit: Payer: Self-pay

## 2017-08-21 DIAGNOSIS — D649 Anemia, unspecified: Secondary | ICD-10-CM

## 2017-08-21 NOTE — Telephone Encounter (Signed)
I spoke to pt's daughter. She is aware of the plan. Pt will go to the lab within the next week and to ED if she has the symptoms listed.  The daughter, Rayna Sexton, said she takes all of the messages for her mom, her mom lives with her. She said we need to call her on her mobile [phone]. I have entered that as her main contact.

## 2017-08-24 NOTE — Telephone Encounter (Signed)
PLEASE CALL PT'S DAUGHTER. SHE NEEDS A CBC WITHIN 7 DAYS.

## 2017-08-25 NOTE — Telephone Encounter (Signed)
Left Vm for [name] to take pt to lab within 7 days.

## 2017-09-01 DIAGNOSIS — D649 Anemia, unspecified: Secondary | ICD-10-CM | POA: Diagnosis not present

## 2017-09-01 LAB — CBC WITH DIFFERENTIAL/PLATELET
BASOS ABS: 60 {cells}/uL (ref 0–200)
Basophils Relative: 1.7 %
EOS ABS: 231 {cells}/uL (ref 15–500)
EOS PCT: 6.6 %
HEMATOCRIT: 27 % — AB (ref 35.0–45.0)
HEMOGLOBIN: 9.2 g/dL — AB (ref 11.7–15.5)
LYMPHS ABS: 1190 {cells}/uL (ref 850–3900)
MCH: 29.7 pg (ref 27.0–33.0)
MCHC: 34.1 g/dL (ref 32.0–36.0)
MCV: 87.1 fL (ref 80.0–100.0)
MPV: 9.5 fL (ref 7.5–12.5)
Monocytes Relative: 9.1 %
NEUTROS ABS: 1701 {cells}/uL (ref 1500–7800)
Neutrophils Relative %: 48.6 %
Platelets: 358 10*3/uL (ref 140–400)
RBC: 3.1 10*6/uL — ABNORMAL LOW (ref 3.80–5.10)
RDW: 17.1 % — AB (ref 11.0–15.0)
Total Lymphocyte: 34 %
WBC mixed population: 319 cells/uL (ref 200–950)
WBC: 3.5 10*3/uL — ABNORMAL LOW (ref 3.8–10.8)

## 2017-09-08 ENCOUNTER — Other Ambulatory Visit: Payer: Self-pay

## 2017-09-08 DIAGNOSIS — D649 Anemia, unspecified: Secondary | ICD-10-CM

## 2017-09-08 NOTE — Progress Notes (Signed)
PT's daughter, [name], is aware. OK to recheck CBC, but daughter has appt on Monday, it may be Tuesday before she can get her to the lab.

## 2017-09-18 ENCOUNTER — Other Ambulatory Visit: Payer: Self-pay | Admitting: Nurse Practitioner

## 2017-09-18 DIAGNOSIS — D649 Anemia, unspecified: Secondary | ICD-10-CM | POA: Diagnosis not present

## 2017-09-19 LAB — CBC WITH DIFFERENTIAL/PLATELET
Basophils Absolute: 59 cells/uL (ref 0–200)
Basophils Relative: 1.4 %
Eosinophils Absolute: 269 cells/uL (ref 15–500)
Eosinophils Relative: 6.4 %
HEMATOCRIT: 26.4 % — AB (ref 35.0–45.0)
HEMOGLOBIN: 8.8 g/dL — AB (ref 11.7–15.5)
LYMPHS ABS: 1705 {cells}/uL (ref 850–3900)
MCH: 30.1 pg (ref 27.0–33.0)
MCHC: 33.3 g/dL (ref 32.0–36.0)
MCV: 90.4 fL (ref 80.0–100.0)
MPV: 9.6 fL (ref 7.5–12.5)
Monocytes Relative: 8.7 %
NEUTROS ABS: 1802 {cells}/uL (ref 1500–7800)
Neutrophils Relative %: 42.9 %
Platelets: 263 10*3/uL (ref 140–400)
RBC: 2.92 10*6/uL — AB (ref 3.80–5.10)
RDW: 16.5 % — ABNORMAL HIGH (ref 11.0–15.0)
Total Lymphocyte: 40.6 %
WBC: 4.2 10*3/uL (ref 3.8–10.8)
WBCMIX: 365 {cells}/uL (ref 200–950)

## 2017-09-25 DIAGNOSIS — I10 Essential (primary) hypertension: Secondary | ICD-10-CM | POA: Diagnosis not present

## 2017-09-25 DIAGNOSIS — D509 Iron deficiency anemia, unspecified: Secondary | ICD-10-CM | POA: Diagnosis not present

## 2017-09-25 DIAGNOSIS — N183 Chronic kidney disease, stage 3 (moderate): Secondary | ICD-10-CM | POA: Diagnosis not present

## 2017-09-25 DIAGNOSIS — G309 Alzheimer's disease, unspecified: Secondary | ICD-10-CM | POA: Diagnosis not present

## 2017-09-26 ENCOUNTER — Other Ambulatory Visit: Payer: Self-pay

## 2017-09-26 DIAGNOSIS — D509 Iron deficiency anemia, unspecified: Secondary | ICD-10-CM

## 2017-09-26 NOTE — Progress Notes (Signed)
PT's daughter, [name], is aware.  She will take pt to lab next Tuesday. I am also mailing them a copy of the lab orders. She said her mom's stools are dark sometimes. She is just worried that since it is down to 8.8, what can be done to keep it from dropping further. She is aware that normally they don't do transfusions until around 8.0. She will call with questions or concerns.

## 2017-09-26 NOTE — Progress Notes (Signed)
Daughter is aware of instructions and if she does not pick up the iFOBT today, she will get it at [facility] tomorrow.

## 2017-09-27 ENCOUNTER — Encounter: Payer: Self-pay | Admitting: Nurse Practitioner

## 2017-09-27 ENCOUNTER — Other Ambulatory Visit: Payer: Self-pay

## 2017-09-27 ENCOUNTER — Ambulatory Visit (INDEPENDENT_AMBULATORY_CARE_PROVIDER_SITE_OTHER): Payer: Medicare Other | Admitting: Nurse Practitioner

## 2017-09-27 VITALS — BP 100/50 | HR 69 | Temp 97.3°F | Ht 62.0 in | Wt 116.0 lb

## 2017-09-27 DIAGNOSIS — D500 Iron deficiency anemia secondary to blood loss (chronic): Secondary | ICD-10-CM

## 2017-09-27 DIAGNOSIS — R1310 Dysphagia, unspecified: Secondary | ICD-10-CM | POA: Diagnosis not present

## 2017-09-27 DIAGNOSIS — R195 Other fecal abnormalities: Secondary | ICD-10-CM

## 2017-09-27 DIAGNOSIS — A048 Other specified bacterial intestinal infections: Secondary | ICD-10-CM | POA: Diagnosis not present

## 2017-09-27 DIAGNOSIS — R1319 Other dysphagia: Secondary | ICD-10-CM

## 2017-09-27 NOTE — Progress Notes (Signed)
Referring Provider: [name], MD Primary Care Physician:  [name], MD Primary GI:  Dr. Oneida Alar  Chief Complaint  Patient presents with  . Dysphagia    still eating soft foods    HPI:   Tina Kline is a 84 y.o. female who presents for posthospitalization follow-up.  The patient was recently admitted to the hospital from 06/20/2017 through 06/23/2017 for syncope and iron deficiency anemia.  Her FOBT was negative.  Her hemoglobin on admission was 6.4 and was deemed likely etiology of her syncope.  She was given 2 units of PRBCs during her hospitalization and her discharge hemoglobin was 10.7.  No objective signs of bleeding during hospitalization.  Her daughter noted on admission that she had seen her mother stools to be dark.  EGD completed 06/22/2017 with gastric and duodenal erosions status post biopsy as well as Schatzki's ring that was dilated.  Ferritin was 6.  She was given IV Feraheme on 06/21/2017.  She was discharged on ferrous sulfate supplementation twice daily and PPI once daily.  Endoscopist recommendation for repeat EGD in 3 months for retreatment and would likely benefit from repeat dilation of the entire esophagus.  Her gastric biopsy surgical pathology found mildly active gastritis positive for H. Pylori.  She was treated with Prevpac for 14 days, hold Protonix as this is included in Prevpac, resume Protonix after Prevpac completion.  Repeat CBC by primary care 08/18/2017 found hemoglobin declined to 9.6.  Recommended repeat CBC in 1 week.  Repeat CBC was checked 09/01/2017 which found some more mild decline although essentially stable at 9.2.  Recommended another repeat in 2 weeks.  This was drawn 09/18/2017 and showed further decline to 8.8.  Recommended recheck in another week.  ER precautions given.  Also recommended FOBT.  Today she is accompanied by her two daughters.  Today she states she's doing fine. Denies abdominal pain, N/V, hematochezia. Noted dark stools  on iron. States no fatigue or weakness, daughters disagree which they attribute to weight loss. Appetite is "alright" but daughters feel she doesn't eat very much. Typically does well with liquids and soft foods. Having Ensure between meals. Swallowing "alright." Denies dysphagia. Daughter states she often will spit some food out, but not sure if it's true dysphagia versus just spitting food out; hasn't noted any coughing after eating. Denies chest pain, dyspnea, dizziness, lightheadedness, syncope, near syncope. Denies any other upper or lower GI symptoms.  Past Medical History:  Diagnosis Date  . Anemia   . Dementia   . Hypertension     Past Surgical History:  Procedure Laterality Date  . APPENDECTOMY    . BIOPSY  06/22/2017   Procedure: BIOPSY;  Surgeon: Daneil Dolin, MD;  [facility];  Service: Endoscopy;;  gastric   . COLONOSCOPY N/A 06/26/2015   Dr. Oneida Alar: Single tubular adenoma removed.  No future colonoscopies for surveillance purposes given age.  . ESOPHAGEAL DILATION N/A 06/22/2017   Procedure: ESOPHAGEAL DILATION;  Surgeon: Daneil Dolin, MD;  [facility];  Service: Endoscopy;  Laterality: N/A;  . ESOPHAGOGASTRODUODENOSCOPY N/A 01/29/2016   Dr. Oneida Alar: proximal esophageal web s/p dilation, large hiatal hernia, gastritis  . ESOPHAGOGASTRODUODENOSCOPY N/A 03/08/2016   Dr. Oneida Alar: proximal esophagel web s/p dilation, gastritis, moderate hiatal hernia  . ESOPHAGOGASTRODUODENOSCOPY  06/2015   Dr. Oneida Alar: Large hiatal hernia, gastritis, proximal esophageal web status post dilation  . ESOPHAGOGASTRODUODENOSCOPY (EGD) WITH PROPOFOL N/A 06/22/2017   Procedure: ESOPHAGOGASTRODUODENOSCOPY (EGD) WITH PROPOFOL;  Surgeon:  Rourk, [name], MD;  [facility];  Service: Endoscopy;  Laterality: N/A;  . MOUTH SURGERY    . SAVORY DILATION N/A 01/29/2016   Procedure: SAVORY DILATION;  Surgeon: Danie Binder, MD;  [facility];  Service: Endoscopy;   Laterality: N/A;  . SAVORY DILATION N/A 03/08/2016   Procedure: SAVORY DILATION;  Surgeon: Danie Binder, MD;  [facility];  Service: Endoscopy;  Laterality: N/A;    Current Outpatient Medications  Medication Sig Dispense Refill  . donepezil (ARICEPT) 10 MG tablet Take 10 mg by mouth at bedtime.    . ferrous sulfate 325 (65 FE) MG EC tablet Take 1 tablet (325 mg total) by mouth 2 (two) times daily. 60 tablet 3  . hydrALAZINE (APRESOLINE) 25 MG tablet Take 25 mg by mouth 2 (two) times daily.     . hydrochlorothiazide (HYDRODIURIL) 25 MG tablet Take 25 mg by mouth daily.    Marland Kitchen levETIRAcetam (KEPPRA) 250 MG tablet Take 1 tablet (250 mg total) by mouth 2 (two) times daily. 60 tablet 2  . lisinopril (PRINIVIL,ZESTRIL) 40 MG tablet Take 40 mg by mouth daily.    . pantoprazole (PROTONIX) 40 MG tablet Take 1 tablet (40 mg total) by mouth daily. 30 tablet 2  . spironolactone (ALDACTONE) 25 MG tablet Take 25 mg by mouth daily.     No current facility-administered medications for this visit.     Allergies as of 09/27/2017  . (No Known Allergies)    Family History  Problem Relation Age of Onset  . Breast cancer Daughter   . Pancreatic cancer Son   . Colon cancer Son     Social History   Socioeconomic History  . Marital status: Widowed    Spouse name: Not on file  . Number of children: 6  . Years of education: Not on file  . Highest education level: Not on file  Occupational History  . Not on file  Social Needs  . Financial resource strain: Not on file  . Food insecurity:    Worry: Not on file    Inability: Not on file  . Transportation needs:    Medical: Not on file    Non-medical: Not on file  Tobacco Use  . Smoking status: Never Smoker  . Smokeless tobacco: Never Used  Substance and Sexual Activity  . Alcohol use: No  . Drug use: No  . Sexual activity: Not on file  Lifestyle  . Physical activity:    Days per week: Not on file    Minutes per session: Not on  file  . Stress: Not on file  Relationships  . Social connections:    Talks on phone: Not on file    Gets together: Not on file    Attends religious service: Not on file    Active member of club or organization: Not on file    Attends meetings of clubs or organizations: Not on file    Relationship status: Not on file  Other Topics Concern  . Not on file  Social History Narrative  . Not on file    Review of Systems: Complete ROS negative except as per HPI.   Physical Exam: BP (!) 100/50   Pulse 69   Temp (!) 97.3 F (36.3 C) (Oral)   Ht 5\' 2"  (1.575 m)   Wt 116 lb (52.6 kg)   BMI 21.22 kg/m  General:   Alert and oriented. Pleasant and cooperative. Well-nourished and well-developed.  Slow moving. Eyes:  Without icterus, sclera clear and conjunctiva pink.  Ears:  Hard of hearing. Cardiovascular:  S1, S2 present without murmurs appreciated. Extremities without clubbing or edema. Respiratory:  Clear to auscultation bilaterally. No wheezes, rales, or rhonchi. No distress.  Gastrointestinal:  +BS, soft, non-tender and non-distended. No HSM noted. No guarding or rebound. No masses appreciated.  Rectal:  Deferred  Musculoskalatal:  Symmetrical without gross deformities.  Neurologic:  Alert and oriented x4;  grossly normal neurologically. Psych:  Alert and cooperative. Normal mood and affect. Heme/Lymph/Immune: No excessive bruising noted.    09/27/2017 11:08 AM   Disclaimer: This note was dictated with voice recognition software. Similar sounding words can inadvertently be transcribed and may not be corrected upon review.

## 2017-09-27 NOTE — Assessment & Plan Note (Signed)
History of iron deficiency anemia with mild decline over the previous 3 months and her hemoglobin.  Her ferritin was 6 in the hospital.  She is on daily iron and received IV Feraheme in the hospital.  At this point I will recheck her CBC, iron, ferritin.  Return for follow-up in 2 months.

## 2017-09-27 NOTE — Assessment & Plan Note (Signed)
The patient's daughters note dark stools.  The patient did have gastric erosions in the presence of H. pylori which is now status post treatment.  She is due for EGD to evaluate for erosion healing and for eradication of H. pylori.  She is on daily iron.  Given her dark stools, mildly declining hemoglobin we will check labs as per below.  I will also have her complete a FOBT.  Return for follow-up in 2 months.  She may eventually need referral to hematology for management of her anemia.  Her anemia is likely multifactorial in nature given her mild kidney disease.

## 2017-09-27 NOTE — Patient Instructions (Signed)
1. Have your blood test completed when you are able to. 2. We will schedule your upper endoscopy with possible dilation for you. 3. Further recommendations will be made after your upper endoscopy. 4. Return for follow-up in 2 months. 5. Call us if you have any questions or concerns.  At [facility] we value your feedback. You may receive a survey about your visit today. Please share your experience as we strive to create trusting relationships with our patients to provide genuine, compassionate, quality care.  We appreciate your understanding and patience as we review any laboratory studies, imaging, and other diagnostic tests that are ordered as we care for you. Our office policy is 5 business days for review of these results, and any emergent or urgent results are addressed in a timely manner for your best interest. If you do not hear from our office in 1 week, please contact us.   We also encourage the use of MyChart, which contains your medical information for your review as well. If you are not enrolled in this feature, an access code is available on this after visit summary for your convenience. Thank you for allowing [place] to be involved in the care of you and your family.   It was great to see you today!  I hope you have a great summer!!

## 2017-09-27 NOTE — Assessment & Plan Note (Signed)
The patient did undergo esophageal dilation during her EGD during hospitalization.  Recommended likely benefit from repeat dilation at her repeat EGD 3 months post discharge.  At this time her daughter is not sure if she is still having dysphasia.  She is tolerating liquids and soft foods well.  However, she frequently spits out food and they are not sure if she is having difficulty swallowing this or if she is to simply spitting it out.  At this point we will add on possible dilation, as per above, for any possible residual dysphasia.  Follow-up in 2 months.

## 2017-09-27 NOTE — Assessment & Plan Note (Signed)
The patient had an upper endoscopy during hospitalization for anemia.  Findings included gastric and duodenal erosions status post biopsy which proved H. pylori infection.  She completed treatment for H. pylori.  Recommendation was for 46-month repeat upper endoscopy.  We will proceed as previously recommended to evaluate for erosion healing and eradication of H. pylori.  She is also possibly still having some dysphasia symptoms and will add on possible dilation, which was also recommended at her last endoscopy as well.  Proceed with EGD +/- dilation with Dr. Oneida Alar in near future: the risks, benefits, and alternatives have been discussed with the patient in detail. The patient states understanding and desires to proceed.  The patient is not on any anticoagulants, anxiolytics, chronic pain medications, or antidepressants.  Conscious sedation should be adequate for her procedure.

## 2017-09-27 NOTE — Progress Notes (Signed)
cc'ed to pcp °

## 2017-09-28 LAB — CBC WITH DIFFERENTIAL/PLATELET
BASOS ABS: 0 10*3/uL (ref 0.0–0.2)
Basos: 1 %
EOS (ABSOLUTE): 0.3 10*3/uL (ref 0.0–0.4)
Eos: 9 %
HEMATOCRIT: 25.8 % — AB (ref 34.0–46.6)
HEMOGLOBIN: 9 g/dL — AB (ref 11.1–15.9)
Immature Grans (Abs): 0 10*3/uL (ref 0.0–0.1)
Immature Granulocytes: 0 %
Lymphocytes Absolute: 0.9 10*3/uL (ref 0.7–3.1)
Lymphs: 27 %
MCH: 31.5 pg (ref 26.6–33.0)
MCHC: 34.9 g/dL (ref 31.5–35.7)
MCV: 90 fL (ref 79–97)
MONOCYTES: 9 %
Monocytes Absolute: 0.3 10*3/uL (ref 0.1–0.9)
Neutrophils Absolute: 1.7 10*3/uL (ref 1.4–7.0)
Neutrophils: 54 %
Platelets: 314 10*3/uL (ref 150–450)
RBC: 2.86 x10E6/uL — ABNORMAL LOW (ref 3.77–5.28)
RDW: 15.8 % — ABNORMAL HIGH (ref 12.3–15.4)
WBC: 3.2 10*3/uL — ABNORMAL LOW (ref 3.4–10.8)

## 2017-09-28 LAB — IRON: Iron: 80 ug/dL (ref 27–139)

## 2017-09-28 LAB — FERRITIN: FERRITIN: 224 ng/mL — AB (ref 15–150)

## 2017-09-28 NOTE — Progress Notes (Signed)
PT's daughter, [name], is aware.

## 2017-10-03 ENCOUNTER — Encounter: Payer: Self-pay | Admitting: Nurse Practitioner

## 2017-10-03 ENCOUNTER — Ambulatory Visit (INDEPENDENT_AMBULATORY_CARE_PROVIDER_SITE_OTHER): Payer: Medicare Other | Admitting: Nurse Practitioner

## 2017-10-03 DIAGNOSIS — R195 Other fecal abnormalities: Secondary | ICD-10-CM | POA: Diagnosis not present

## 2017-10-03 LAB — IFOBT (OCCULT BLOOD): IMMUNOLOGICAL FECAL OCCULT BLOOD TEST: NEGATIVE

## 2017-10-04 ENCOUNTER — Telehealth: Payer: Self-pay | Admitting: Nurse Practitioner

## 2017-10-04 NOTE — Progress Notes (Signed)
Please inform the patient. Can re-relay previously given labs results (improved hgb, improved ferritin). Keep appointment for EGD and futher recommendations to follow EGD.

## 2017-10-04 NOTE — Telephone Encounter (Signed)
Called and spoke to daughter [name]). Informed her IFOBT was negative and EG's recommendation. She was aware of recent labs results. Iron was decreased to daily.

## 2017-10-04 NOTE — Telephone Encounter (Signed)
iFOBT results note: Please inform the patient. Can re-relay previously given labs results (improved hgb, improved ferritin). Keep appointment for EGD and futher recommendations to follow EGD.

## 2017-10-15 ENCOUNTER — Emergency Department (HOSPITAL_COMMUNITY)
Admission: EM | Admit: 2017-10-15 | Discharge: 2017-10-15 | Disposition: A | Discharge status: Home / Self Care | Payer: Medicare Other | Attending: Emergency Medicine | Admitting: Emergency Medicine

## 2017-10-15 ENCOUNTER — Other Ambulatory Visit: Payer: Self-pay

## 2017-10-15 ENCOUNTER — Encounter (HOSPITAL_COMMUNITY): Payer: Self-pay

## 2017-10-15 DIAGNOSIS — I129 Hypertensive chronic kidney disease with stage 1 through stage 4 chronic kidney disease, or unspecified chronic kidney disease: Secondary | ICD-10-CM | POA: Insufficient documentation

## 2017-10-15 DIAGNOSIS — N189 Chronic kidney disease, unspecified: Secondary | ICD-10-CM | POA: Insufficient documentation

## 2017-10-15 DIAGNOSIS — Z79899 Other long term (current) drug therapy: Secondary | ICD-10-CM | POA: Insufficient documentation

## 2017-10-15 DIAGNOSIS — F0390 Unspecified dementia without behavioral disturbance: Secondary | ICD-10-CM | POA: Diagnosis not present

## 2017-10-15 DIAGNOSIS — R55 Syncope and collapse: Secondary | ICD-10-CM | POA: Insufficient documentation

## 2017-10-15 DIAGNOSIS — D649 Anemia, unspecified: Secondary | ICD-10-CM | POA: Diagnosis not present

## 2017-10-15 HISTORY — DX: Unspecified convulsions: R56.9

## 2017-10-15 LAB — CBC
HEMATOCRIT: 26.7 % — AB (ref 36.0–46.0)
HEMOGLOBIN: 9 g/dL — AB (ref 12.0–15.0)
MCH: 31 pg (ref 26.0–34.0)
MCHC: 33.7 g/dL (ref 30.0–36.0)
MCV: 92.1 fL (ref 78.0–100.0)
Platelets: 290 10*3/uL (ref 150–400)
RBC: 2.9 MIL/uL — ABNORMAL LOW (ref 3.87–5.11)
RDW: 14.1 % (ref 11.5–15.5)
WBC: 3.9 10*3/uL — ABNORMAL LOW (ref 4.0–10.5)

## 2017-10-15 LAB — BASIC METABOLIC PANEL
ANION GAP: 9 (ref 5–15)
BUN: 19 mg/dL (ref 8–23)
CHLORIDE: 92 mmol/L — AB (ref 98–111)
CO2: 21 mmol/L — AB (ref 22–32)
Calcium: 9.6 mg/dL (ref 8.9–10.3)
Creatinine, Ser: 1.82 mg/dL — ABNORMAL HIGH (ref 0.44–1.00)
GFR calc non Af Amer: 24 mL/min — ABNORMAL LOW (ref >60)
GFR, EST AFRICAN AMERICAN: 28 mL/min — AB (ref >60)
GLUCOSE: 118 mg/dL — AB (ref 70–99)
POTASSIUM: 5 mmol/L (ref 3.5–5.1)
Sodium: 122 mmol/L — ABNORMAL LOW (ref 135–145)

## 2017-10-15 LAB — URINALYSIS, ROUTINE W REFLEX MICROSCOPIC
Bilirubin Urine: NEGATIVE
Glucose, UA: NEGATIVE mg/dL
Hgb urine dipstick: NEGATIVE
Ketones, ur: NEGATIVE mg/dL
Nitrite: NEGATIVE
PROTEIN: NEGATIVE mg/dL
Specific Gravity, Urine: 1.006 (ref 1.005–1.030)
pH: 6 (ref 5.0–8.0)

## 2017-10-15 LAB — CBG MONITORING, ED: GLUCOSE-CAPILLARY: 112 mg/dL — AB (ref 70–99)

## 2017-10-15 NOTE — ED Provider Notes (Signed)
[facility] EMERGENCY DEPARTMENT Provider Note   CSN: [id number] Arrival date & time: 10/15/17  1131     History   Chief Complaint Chief Complaint  Patient presents with  . Near Syncope    HPI Tina Kline is a 84 y.o. female.  Level 5 caveat for mild dementia.  Patient's daughter had been washing her while standing next to the sink.  When she attempted to leave the bathroom, she slowly slumped to the floor with the daughter catching her.  This is happened a couple of times in the past.  No true loss of consciousness or neurological deficits.  No prodromal illnesses.  Patient feels back to normal now.  No chest pain, dyspnea, neuro deficits, dysuria, fever, chills     Past Medical History:  Diagnosis Date  . Anemia   . Dementia   . Hypertension   . Seizures [facility])     Patient Active Problem List   Diagnosis Date Noted  . H. pylori infection 09/27/2017  . Dark stools 09/27/2017  . Syncope 06/20/2017  . Anemia   . Dysphagia 01/20/2016  . IDA (iron deficiency anemia) 06/08/2015  . Chronic renal disease 06/08/2015  . Arthritis of knee, degenerative 06/13/2013    Past Surgical History:  Procedure Laterality Date  . APPENDECTOMY    . BIOPSY  06/22/2017   Procedure: BIOPSY;  Surgeon: Daneil Dolin, MD;  [facility]: [facility];  Service: Endoscopy;;  gastric   . COLONOSCOPY N/A 06/26/2015   Dr. Oneida Alar: Single tubular adenoma removed.  No future colonoscopies for surveillance purposes given age.  . ESOPHAGEAL DILATION N/A 06/22/2017   Procedure: ESOPHAGEAL DILATION;  Surgeon: Daneil Dolin, MD;  [facility]: [facility];  Service: Endoscopy;  Laterality: N/A;  . ESOPHAGOGASTRODUODENOSCOPY N/A 01/29/2016   Dr. Oneida Alar: proximal esophageal web s/p dilation, large hiatal hernia, gastritis  . ESOPHAGOGASTRODUODENOSCOPY N/A 03/08/2016   Dr. Oneida Alar: proximal esophagel web s/p dilation, gastritis, moderate hiatal hernia  . ESOPHAGOGASTRODUODENOSCOPY  06/2015   Dr. Oneida Alar:  Large hiatal hernia, gastritis, proximal esophageal web status post dilation  . ESOPHAGOGASTRODUODENOSCOPY (EGD) WITH PROPOFOL N/A 06/22/2017   Procedure: ESOPHAGOGASTRODUODENOSCOPY (EGD) WITH PROPOFOL;  Surgeon: Daneil Dolin, MD;  [facility];  Service: Endoscopy;  Laterality: N/A;  . MOUTH SURGERY    . SAVORY DILATION N/A 01/29/2016   Procedure: SAVORY DILATION;  Surgeon: Danie Binder, MD;  [facility];  Service: Endoscopy;  Laterality: N/A;  . SAVORY DILATION N/A 03/08/2016   Procedure: SAVORY DILATION;  Surgeon: Danie Binder, MD;  [facility];  Service: Endoscopy;  Laterality: N/A;     OB History    Gravida  6   Para  6   Term  6   Preterm      AB      Living  4     SAB      TAB      Ectopic      Multiple      Live Births               Home Medications    Prior to Admission medications   Medication Sig Start Date End Date Taking? Authorizing Provider  acetaminophen (TYLENOL) 500 MG tablet Take 500 mg by mouth every 6 (six) hours as needed for mild pain or headache.   Yes [provider]  donepezil (ARICEPT) 10 MG tablet Take 10 mg by mouth at bedtime.   Yes [provider]  ferrous sulfate 325 (65 FE) MG EC tablet Take 1 tablet (325 mg total) by mouth 2 (two) times daily. 06/23/17 06/23/18 Yes Erline Hau, MD  hydrALAZINE (APRESOLINE) 25 MG tablet Take 25 mg by mouth 2 (two) times daily.    Yes [provider]  hydrochlorothiazide (HYDRODIURIL) 25 MG tablet Take 25 mg by mouth daily.   Yes [provider]  levETIRAcetam (KEPPRA) 250 MG tablet Take 1 tablet (250 mg total) by mouth 2 (two) times daily. 06/23/17  Yes Isaac Bliss, Rayford Halsted, MD  lisinopril (PRINIVIL,ZESTRIL) 40 MG tablet Take 40 mg by mouth daily.   Yes [provider]  pantoprazole (PROTONIX) 40 MG tablet Take 1 tablet (40 mg total) by mouth daily. 06/24/17  Yes Isaac Bliss, Rayford Halsted, MD    spironolactone (ALDACTONE) 25 MG tablet Take 25 mg by mouth daily.   Yes [provider]    Family History Family History  Problem Relation Age of Onset  . Breast cancer Daughter   . Pancreatic cancer Son   . Colon cancer Son     Social History Social History   Tobacco Use  . Smoking status: Never Smoker  . Smokeless tobacco: Never Used  Substance Use Topics  . Alcohol use: No  . Drug use: No     Allergies   Patient has no known allergies.   Review of Systems Review of Systems  All other systems reviewed and are negative.    Physical Exam Updated Vital Signs BP (!) 118/55   Pulse 76   Temp (!) 97.5 F (36.4 C) (Oral)   Resp 17   Ht 5\' 2"  (1.575 m)   Wt 52.6 kg   SpO2 100%   BMI 21.22 kg/m   Physical Exam  Constitutional: She is oriented to person, place, and time. She appears well-developed and well-nourished.  nad  HENT:  Head: Normocephalic and atraumatic.  Eyes: Conjunctivae are normal.  Neck: Neck supple.  Cardiovascular: Normal rate and regular rhythm.  Pulmonary/Chest: Effort normal and breath sounds normal.  Abdominal: Soft. Bowel sounds are normal.  Musculoskeletal: Normal range of motion.  Neurological: She is alert and oriented to person, place, and time.  Skin: Skin is warm and dry.  Psychiatric: She has a normal mood and affect. Her behavior is normal.  Nursing note and vitals reviewed.    ED Treatments / Results  Labs (all labs ordered are listed, but only abnormal results are displayed) Labs Reviewed  BASIC METABOLIC PANEL - Abnormal; Notable for the following components:      Result Value   Sodium 122 (*)    Chloride 92 (*)    CO2 21 (*)    Glucose, Bld 118 (*)    Creatinine, Ser 1.82 (*)    GFR calc non Af Amer 24 (*)    GFR calc Af Amer 28 (*)    All other components within normal limits  CBC - Abnormal; Notable for the following components:   WBC 3.9 (*)    RBC 2.90 (*)    Hemoglobin 9.0 (*)    HCT 26.7 (*)     All other components within normal limits  URINALYSIS, ROUTINE W REFLEX MICROSCOPIC - Abnormal; Notable for the following components:   Leukocytes, UA TRACE (*)    Bacteria, UA RARE (*)    All other components within normal limits  CBG MONITORING, ED - Abnormal; Notable for the following components:   Glucose-Capillary 112 (*)  All other components within normal limits    EKG EKG Interpretation  Date/Time:  Sunday October 15 2017 11:58:43 EDT Ventricular Rate:  84 PR Interval:    QRS Duration: 103 QT Interval:  371 QTC Calculation: 439 R Axis:   78 Text Interpretation:  Sinus rhythm Low voltage, precordial leads Minimal ST elevation, inferior leads Confirmed by Nat Christen [phone]) on 10/15/2017 12:57:27 PM   Radiology No results found.  Procedures Procedures (including critical care time)  Medications Ordered in ED Medications - No data to display   Initial Impression / Assessment and Plan / ED Course  I have reviewed the triage vital signs and the nursing notes.  Pertinent labs & imaging results that were available during my care of the patient were reviewed by me and considered in my medical decision making (see chart for details).     Patient presents with a near syncopal spell.  She has a normal physical exam.  EKG within normal limits.  Hemoglobin 9.0 (9.0 two weeks ago).  Patient is on an iron prescription at home.  Discussed findings with the patient and her 2 daughters.  She will follow-up with her primary care doctor.  Final Clinical Impressions(s) / ED Diagnoses   Final diagnoses:  Near syncope  Anemia, unspecified type    ED Discharge Orders    None       Nat Christen, MD 10/15/17 [phone]

## 2017-10-15 NOTE — Discharge Instructions (Addendum)
You are anemic (hemoglobin 9.0) which has been a problem in the past.  This can often lead to near fainting spells.  Recommend taking over-the-counter iron and eating iron rich foods.  Follow-up with your primary care doctor.

## 2017-10-15 NOTE — ED Triage Notes (Signed)
Pt was at home with her daughter and trying to clean. Pt became weak and began to fall and daughter caught her. Did not lose consciousness

## 2017-11-14 DIAGNOSIS — Z79899 Other long term (current) drug therapy: Secondary | ICD-10-CM | POA: Diagnosis not present

## 2017-11-14 DIAGNOSIS — I10 Essential (primary) hypertension: Secondary | ICD-10-CM | POA: Diagnosis not present

## 2017-11-14 DIAGNOSIS — G40219 Localization-related (focal) (partial) symptomatic epilepsy and epileptic syndromes with complex partial seizures, intractable, without status epilepticus: Secondary | ICD-10-CM | POA: Diagnosis not present

## 2017-11-14 DIAGNOSIS — F0150 Vascular dementia without behavioral disturbance: Secondary | ICD-10-CM | POA: Diagnosis not present

## 2017-11-23 ENCOUNTER — Telehealth: Payer: Self-pay | Admitting: Gastroenterology

## 2017-11-23 NOTE — Telephone Encounter (Signed)
Noted  

## 2017-11-23 NOTE — Telephone Encounter (Signed)
Pt's daughter called to cancel procedure with SF on 10/25 due to other medical issues they are taking care of and will reschedule later.

## 2017-11-23 NOTE — Telephone Encounter (Signed)
Noted. Called [facility] in Endo and LMOVM to cancel procedure. FYI to EG

## 2017-11-30 ENCOUNTER — Ambulatory Visit: Payer: Self-pay | Admitting: Gastroenterology

## 2017-11-30 ENCOUNTER — Ambulatory Visit: Payer: Medicare Other | Admitting: Gastroenterology

## 2017-12-01 ENCOUNTER — Encounter (HOSPITAL_COMMUNITY): Payer: Self-pay

## 2017-12-01 ENCOUNTER — Ambulatory Visit (HOSPITAL_COMMUNITY): Admit: 2017-12-01 | Payer: Medicare Other | Admitting: Gastroenterology

## 2017-12-01 SURGERY — EGD (ESOPHAGOGASTRODUODENOSCOPY)
Anesthesia: Moderate Sedation

## 2017-12-03 ENCOUNTER — Encounter (HOSPITAL_COMMUNITY): Payer: Self-pay | Admitting: Emergency Medicine

## 2017-12-03 ENCOUNTER — Emergency Department (HOSPITAL_COMMUNITY)
Admission: EM | Admit: 2017-12-03 | Discharge: 2017-12-03 | Disposition: A | Discharge status: Home / Self Care | Payer: Medicare Other | Attending: Emergency Medicine | Admitting: Emergency Medicine

## 2017-12-03 ENCOUNTER — Other Ambulatory Visit: Payer: Self-pay

## 2017-12-03 DIAGNOSIS — Z79899 Other long term (current) drug therapy: Secondary | ICD-10-CM | POA: Diagnosis not present

## 2017-12-03 DIAGNOSIS — R9431 Abnormal electrocardiogram [ECG] [EKG]: Secondary | ICD-10-CM | POA: Diagnosis not present

## 2017-12-03 DIAGNOSIS — I10 Essential (primary) hypertension: Secondary | ICD-10-CM | POA: Insufficient documentation

## 2017-12-03 DIAGNOSIS — R55 Syncope and collapse: Secondary | ICD-10-CM | POA: Diagnosis not present

## 2017-12-03 DIAGNOSIS — R531 Weakness: Secondary | ICD-10-CM | POA: Diagnosis not present

## 2017-12-03 DIAGNOSIS — E860 Dehydration: Secondary | ICD-10-CM | POA: Insufficient documentation

## 2017-12-03 DIAGNOSIS — F0390 Unspecified dementia without behavioral disturbance: Secondary | ICD-10-CM | POA: Diagnosis not present

## 2017-12-03 LAB — COMPREHENSIVE METABOLIC PANEL
ALBUMIN: 4.5 g/dL (ref 3.5–5.0)
ALT: 14 U/L (ref 0–44)
ANION GAP: 10 (ref 5–15)
AST: 21 U/L (ref 15–41)
Alkaline Phosphatase: 70 U/L (ref 38–126)
BUN: 25 mg/dL — ABNORMAL HIGH (ref 8–23)
CHLORIDE: 92 mmol/L — AB (ref 98–111)
CO2: 21 mmol/L — AB (ref 22–32)
Calcium: 9.7 mg/dL (ref 8.9–10.3)
Creatinine, Ser: 2.02 mg/dL — ABNORMAL HIGH (ref 0.44–1.00)
GFR calc non Af Amer: 21 mL/min — ABNORMAL LOW (ref >60)
GFR, EST AFRICAN AMERICAN: 25 mL/min — AB (ref >60)
GLUCOSE: 103 mg/dL — AB (ref 70–99)
Potassium: 5 mmol/L (ref 3.5–5.1)
Sodium: 123 mmol/L — ABNORMAL LOW (ref 135–145)
Total Bilirubin: 0.7 mg/dL (ref 0.3–1.2)
Total Protein: 7.8 g/dL (ref 6.5–8.1)

## 2017-12-03 LAB — CBC WITH DIFFERENTIAL/PLATELET
ABS IMMATURE GRANULOCYTES: 0.01 10*3/uL (ref 0.00–0.07)
BASOS PCT: 1 %
Basophils Absolute: 0 10*3/uL (ref 0.0–0.1)
EOS ABS: 0.1 10*3/uL (ref 0.0–0.5)
Eosinophils Relative: 5 %
HEMATOCRIT: 28.3 % — AB (ref 36.0–46.0)
Hemoglobin: 9.2 g/dL — ABNORMAL LOW (ref 12.0–15.0)
IMMATURE GRANULOCYTES: 1 %
Lymphocytes Relative: 30 %
Lymphs Abs: 0.6 10*3/uL — ABNORMAL LOW (ref 0.7–4.0)
MCH: 31.2 pg (ref 26.0–34.0)
MCHC: 32.5 g/dL (ref 30.0–36.0)
MCV: 95.9 fL (ref 80.0–100.0)
MONO ABS: 0.1 10*3/uL (ref 0.1–1.0)
MONOS PCT: 7 %
NEUTROS ABS: 1.2 10*3/uL — AB (ref 1.7–7.7)
Neutrophils Relative %: 56 %
Platelets: 247 10*3/uL (ref 150–400)
RBC: 2.95 MIL/uL — ABNORMAL LOW (ref 3.87–5.11)
RDW: 12.2 % (ref 11.5–15.5)
WBC: 2.2 10*3/uL — ABNORMAL LOW (ref 4.0–10.5)
nRBC: 0 % (ref 0.0–0.2)

## 2017-12-03 LAB — URINALYSIS, ROUTINE W REFLEX MICROSCOPIC
Bilirubin Urine: NEGATIVE
GLUCOSE, UA: NEGATIVE mg/dL
Hgb urine dipstick: NEGATIVE
Ketones, ur: NEGATIVE mg/dL
LEUKOCYTES UA: NEGATIVE
NITRITE: NEGATIVE
PH: 7 (ref 5.0–8.0)
Protein, ur: NEGATIVE mg/dL
Specific Gravity, Urine: 1.005 (ref 1.005–1.030)

## 2017-12-03 MED ORDER — SODIUM CHLORIDE 0.9 % IV SOLN
1000.0000 mL | INTRAVENOUS | Status: DC
  Administered 2017-12-03: 1000 mL via INTRAVENOUS

## 2017-12-03 MED ORDER — SODIUM CHLORIDE 0.9 % IV BOLUS
500.0000 mL | Freq: Once | INTRAVENOUS | Status: AC
  Administered 2017-12-03: 500 mL via INTRAVENOUS

## 2017-12-03 MED ORDER — SODIUM CHLORIDE 0.9 % IV BOLUS (SEPSIS)
500.0000 mL | Freq: Once | INTRAVENOUS | Status: AC
  Administered 2017-12-03: 500 mL via INTRAVENOUS

## 2017-12-03 NOTE — ED Notes (Signed)
Pt ambulated with significant difficulty.  Pt was unable to comprehend instructions to stand upright and bear weight.  Was able to stand and reposition self on bed, but would not walk forward.

## 2017-12-03 NOTE — ED Notes (Signed)
Daughter states pt "had an episode" this morning where she was not responding for a few seconds and her eyes were rolled back.  Did not have any jerking motions or incontinence.  Daughter reports these symptoms ongoing however are becoming more frequent in occurrence.

## 2017-12-03 NOTE — ED Triage Notes (Signed)
Pt brought in for a syncopal episode this morning. Daughter was with her and reported that she may have had a seizure. Pt sees Dr. Merlene Laughter for seizures and syncope. Pt alert, but confused.

## 2017-12-03 NOTE — Discharge Instructions (Signed)
Drink plenty of fluids, follow-up with her doctor this week to be rechecked, return to the emergency room for any recurrent episodes

## 2017-12-03 NOTE — ED Provider Notes (Signed)
[facility] EMERGENCY DEPARTMENT Provider Note   CSN: [id number] Arrival date & time: 12/03/17  1116     History   Chief Complaint Chief Complaint  Patient presents with  . Loss of Consciousness    HPI Tina Kline is a 84 y.o. female.  HPI Pt had a brief LOC spell.  She was sitting and then Daughter noticed tongue was hanging out to the side, hand was trembling a little bit.  SHe had a similar episode over a month ago.  THis lasted for a couple of seconds and then she was able to communicate after a couple of seconds.   She has been told sx are possible related to seizures.  She sees Dr Merlene Laughter.   Past Medical History:  Diagnosis Date  . Anemia   . Dementia ([place])   . Hypertension   . Seizures [facility])     Patient Active Problem List   Diagnosis Date Noted  . H. pylori infection 09/27/2017  . Dark stools 09/27/2017  . Syncope 06/20/2017  . Anemia   . Dysphagia 01/20/2016  . IDA (iron deficiency anemia) 06/08/2015  . Chronic renal disease 06/08/2015  . Arthritis of knee, degenerative 06/13/2013    Past Surgical History:  Procedure Laterality Date  . APPENDECTOMY    . BIOPSY  06/22/2017   Procedure: BIOPSY;  Surgeon: Daneil Dolin, MD;  [facility];  Service: Endoscopy;;  gastric   . COLONOSCOPY N/A 06/26/2015   Dr. Oneida Alar: Single tubular adenoma removed.  No future colonoscopies for surveillance purposes given age.  . ESOPHAGEAL DILATION N/A 06/22/2017   Procedure: ESOPHAGEAL DILATION;  Surgeon: Daneil Dolin, MD;  [facility];  Service: Endoscopy;  Laterality: N/A;  . ESOPHAGOGASTRODUODENOSCOPY N/A 01/29/2016   Dr. Oneida Alar: proximal esophageal web s/p dilation, large hiatal hernia, gastritis  . ESOPHAGOGASTRODUODENOSCOPY N/A 03/08/2016   Dr. Oneida Alar: proximal esophagel web s/p dilation, gastritis, moderate hiatal hernia  . ESOPHAGOGASTRODUODENOSCOPY  06/2015   Dr. Oneida Alar: Large hiatal hernia, gastritis, proximal esophageal web status  post dilation  . ESOPHAGOGASTRODUODENOSCOPY (EGD) WITH PROPOFOL N/A 06/22/2017   Procedure: ESOPHAGOGASTRODUODENOSCOPY (EGD) WITH PROPOFOL;  Surgeon: Daneil Dolin, MD;  [facility];  Service: Endoscopy;  Laterality: N/A;  . MOUTH SURGERY    . SAVORY DILATION N/A 01/29/2016   Procedure: SAVORY DILATION;  Surgeon: Danie Binder, MD;  [facility];  Service: Endoscopy;  Laterality: N/A;  . SAVORY DILATION N/A 03/08/2016   Procedure: SAVORY DILATION;  Surgeon: Danie Binder, MD;  [facility];  Service: Endoscopy;  Laterality: N/A;     OB History    Gravida  6   Para  6   Term  6   Preterm      AB      Living  4     SAB      TAB      Ectopic      Multiple      Live Births               Home Medications    Prior to Admission medications   Medication Sig Start Date End Date Taking? Authorizing Provider  acetaminophen (TYLENOL) 500 MG tablet Take 500 mg by mouth every 6 (six) hours as needed for mild pain or headache.   Yes [provider]  donepezil (ARICEPT) 10 MG tablet Take 10 mg by mouth at bedtime.   Yes [provider]  ferrous sulfate  325 (65 FE) MG EC tablet Take 1 tablet (325 mg total) by mouth 2 (two) times daily. 06/23/17 06/23/18 Yes Erline Hau, MD  hydrALAZINE (APRESOLINE) 25 MG tablet Take 25 mg by mouth 2 (two) times daily.    Yes [provider]  hydrochlorothiazide (HYDRODIURIL) 25 MG tablet Take 25 mg by mouth daily.   Yes [provider]  levETIRAcetam (KEPPRA) 250 MG tablet Take 1 tablet (250 mg total) by mouth 2 (two) times daily. 06/23/17  Yes Isaac Bliss, Rayford Halsted, MD  lisinopril (PRINIVIL,ZESTRIL) 40 MG tablet Take 40 mg by mouth daily.   Yes [provider]  pantoprazole (PROTONIX) 40 MG tablet Take 1 tablet (40 mg total) by mouth daily. 06/24/17  Yes Isaac Bliss, Rayford Halsted, MD  spironolactone (ALDACTONE) 25 MG tablet Take 25 mg by mouth daily.    Yes [provider]    Family History Family History  Problem Relation Age of Onset  . Breast cancer Daughter   . Pancreatic cancer Son   . Colon cancer Son     Social History Social History   Tobacco Use  . Smoking status: Never Smoker  . Smokeless tobacco: Never Used  Substance Use Topics  . Alcohol use: No  . Drug use: No     Allergies   Patient has no known allergies.   Review of Systems Review of Systems  Constitutional: Negative for fever.  Respiratory: Negative for shortness of breath.   Cardiovascular: Negative for chest pain.  Gastrointestinal: Negative for abdominal pain.  Neurological: Positive for weakness.  All other systems reviewed and are negative.    Physical Exam Updated Vital Signs BP (!) 116/54   Pulse 68   Temp 98.3 F (36.8 C) (Oral)   Resp 11   Ht 1.549 m (5\' 1" )   Wt 51.3 kg   SpO2 99%   BMI 21.35 kg/m   Physical Exam  Constitutional: She is oriented to person, place, and time. She appears well-developed and well-nourished. No distress.  HENT:  Head: Normocephalic and atraumatic.  Right Ear: External ear normal.  Left Ear: External ear normal.  Mouth/Throat: Oropharynx is clear and moist.  Eyes: Conjunctivae are normal. Right eye exhibits no discharge. Left eye exhibits no discharge. No scleral icterus.  Neck: Neck supple. No tracheal deviation present.  Cardiovascular: Normal rate, regular rhythm and intact distal pulses.  Pulmonary/Chest: Effort normal and breath sounds normal. No stridor. No respiratory distress. She has no wheezes. She has no rales.  Abdominal: Soft. Bowel sounds are normal. She exhibits no distension. There is no tenderness. There is no rebound and no guarding.  Musculoskeletal: She exhibits no edema or tenderness.  Neurological: She is alert and oriented to person, place, and time. No cranial nerve deficit (No facial droop,  tongue midline  ,nl speech ) or sensory deficit. She exhibits normal  muscle tone. She displays no seizure activity. Coordination normal.  Generalized weakness but not focal, No pronator drift bilateral upper extrem, able to hold both legs off bed for 5 seconds, sensation intact in all extremities, no visual field cuts, no left or right sided neglect, normal finger-nose exam bilaterally, no nystagmus noted   Skin: Skin is warm and dry. No rash noted.  Psychiatric: She has a normal mood and affect.  Nursing note and vitals reviewed.    ED Treatments / Results  Labs (all labs ordered are listed, but only abnormal results are displayed) Labs Reviewed  CBC WITH DIFFERENTIAL/PLATELET - Abnormal;  Notable for the following components:      Result Value   WBC 2.2 (*)    RBC 2.95 (*)    Hemoglobin 9.2 (*)    HCT 28.3 (*)    Neutro Abs 1.2 (*)    Lymphs Abs 0.6 (*)    All other components within normal limits  COMPREHENSIVE METABOLIC PANEL - Abnormal; Notable for the following components:   Sodium 123 (*)    Chloride 92 (*)    CO2 21 (*)    Glucose, Bld 103 (*)    BUN 25 (*)    Creatinine, Ser 2.02 (*)    GFR calc non Af Amer 21 (*)    GFR calc Af Amer 25 (*)    All other components within normal limits  URINALYSIS, ROUTINE W REFLEX MICROSCOPIC - Abnormal; Notable for the following components:   Color, Urine STRAW (*)    All other components within normal limits    EKG EKG Interpretation  Date/Time:  Sunday December 03 2017 11:46:29 EDT Ventricular Rate:  69 PR Interval:    QRS Duration: 96 QT Interval:  376 QTC Calculation: 403 R Axis:   54 Text Interpretation:  Sinus rhythm Low voltage, extremity and precordial leads Abnormal R-wave progression, early transition No significant change since last tracing Confirmed by Dorie Rank [phone]) on 12/03/2017 12:30:50 PM   Radiology No results found.  Procedures Procedures (including critical care time)  Medications Ordered in ED Medications  sodium chloride 0.9 % bolus 500 mL (0 mLs Intravenous  Stopped 12/03/17 1350)    Followed by  0.9 %  sodium chloride infusion ( Intravenous Rate/Dose Verify 12/03/17 1350)  sodium chloride 0.9 % bolus 500 mL (500 mLs Intravenous Bolus from Bag 12/03/17 1457)     Initial Impression / Assessment and Plan / ED Course  I have reviewed the triage vital signs and the nursing notes.  Pertinent labs & imaging results that were available during my care of the patient were reviewed by me and considered in my medical decision making (see chart for details).  Clinical Course as of Dec 04 1606  Sun Dec 03, 2017  1307 Hemoglobin is stable compared to previous  CBC with Differential(!) [JK]  1308 Hyponatremia stable.  Creatinine is increased compared to previous  Comprehensive metabolic panel(!) [JK]  4665 She has been  able to walk around in the ED with assistance per her baseline according to her daughter   [JK]    Clinical Course User Index [JK] Dorie Rank, MD   Pt presented to the ED with near syncope, syncope.  The episode only lasted a few seconds.  Pt has had similar symptoms previously attributed to seizures.  The symptoms today do not suggest a seizure.  She has not had a good appetite and daughter states she does not drink a lot of fluids.  Patient is not having any chest pain.  No signs of infection.  She does not have any focal neurologic deficits.  Pt was given IV fluids.  Sx improved with treatment.  Pt has been able to walk around without difficulty.  Suspect her symptoms related to dehydration.  Encouraged po fluids, close outpatient follow up.  Final Clinical Impressions(s) / ED Diagnoses   Final diagnoses:  Near syncope  Dehydration    ED Discharge Orders    None       Dorie Rank, MD 12/03/17 [phone]

## 2017-12-08 DIAGNOSIS — R569 Unspecified convulsions: Secondary | ICD-10-CM | POA: Diagnosis not present

## 2017-12-11 DIAGNOSIS — E785 Hyperlipidemia, unspecified: Secondary | ICD-10-CM | POA: Diagnosis not present

## 2017-12-11 DIAGNOSIS — Z1331 Encounter for screening for depression: Secondary | ICD-10-CM | POA: Diagnosis not present

## 2017-12-11 DIAGNOSIS — Z0000 Encounter for general adult medical examination without abnormal findings: Secondary | ICD-10-CM | POA: Diagnosis not present

## 2017-12-11 DIAGNOSIS — E559 Vitamin D deficiency, unspecified: Secondary | ICD-10-CM | POA: Diagnosis not present

## 2017-12-11 DIAGNOSIS — G309 Alzheimer's disease, unspecified: Secondary | ICD-10-CM | POA: Diagnosis not present

## 2017-12-11 DIAGNOSIS — R1310 Dysphagia, unspecified: Secondary | ICD-10-CM | POA: Diagnosis not present

## 2017-12-11 DIAGNOSIS — N183 Chronic kidney disease, stage 3 (moderate): Secondary | ICD-10-CM | POA: Diagnosis not present

## 2017-12-11 DIAGNOSIS — R634 Abnormal weight loss: Secondary | ICD-10-CM | POA: Diagnosis not present

## 2017-12-11 DIAGNOSIS — D649 Anemia, unspecified: Secondary | ICD-10-CM | POA: Diagnosis not present

## 2017-12-11 DIAGNOSIS — I10 Essential (primary) hypertension: Secondary | ICD-10-CM | POA: Diagnosis not present

## 2017-12-11 DIAGNOSIS — R739 Hyperglycemia, unspecified: Secondary | ICD-10-CM | POA: Diagnosis not present

## 2017-12-11 DIAGNOSIS — Z1389 Encounter for screening for other disorder: Secondary | ICD-10-CM | POA: Diagnosis not present

## 2018-01-01 ENCOUNTER — Emergency Department (HOSPITAL_COMMUNITY): Payer: Medicare Other

## 2018-01-01 ENCOUNTER — Inpatient Hospital Stay (HOSPITAL_COMMUNITY)
Admission: EM | Admit: 2018-01-01 | Discharge: 2018-01-03 | DRG: 640 | Disposition: A | Discharge status: Skilled Nursing Facility | Payer: Medicare Other | Attending: Internal Medicine | Admitting: Internal Medicine

## 2018-01-01 ENCOUNTER — Encounter (HOSPITAL_COMMUNITY): Payer: Self-pay | Admitting: Emergency Medicine

## 2018-01-01 ENCOUNTER — Other Ambulatory Visit: Payer: Self-pay

## 2018-01-01 DIAGNOSIS — B370 Candidal stomatitis: Secondary | ICD-10-CM | POA: Diagnosis present

## 2018-01-01 DIAGNOSIS — Z79899 Other long term (current) drug therapy: Secondary | ICD-10-CM

## 2018-01-01 DIAGNOSIS — R55 Syncope and collapse: Secondary | ICD-10-CM | POA: Diagnosis not present

## 2018-01-01 DIAGNOSIS — N179 Acute kidney failure, unspecified: Secondary | ICD-10-CM | POA: Diagnosis not present

## 2018-01-01 DIAGNOSIS — K449 Diaphragmatic hernia without obstruction or gangrene: Secondary | ICD-10-CM | POA: Diagnosis present

## 2018-01-01 DIAGNOSIS — I129 Hypertensive chronic kidney disease with stage 1 through stage 4 chronic kidney disease, or unspecified chronic kidney disease: Secondary | ICD-10-CM | POA: Diagnosis not present

## 2018-01-01 DIAGNOSIS — R569 Unspecified convulsions: Secondary | ICD-10-CM | POA: Diagnosis not present

## 2018-01-01 DIAGNOSIS — Z803 Family history of malignant neoplasm of breast: Secondary | ICD-10-CM

## 2018-01-01 DIAGNOSIS — R627 Adult failure to thrive: Secondary | ICD-10-CM | POA: Diagnosis present

## 2018-01-01 DIAGNOSIS — I959 Hypotension, unspecified: Secondary | ICD-10-CM | POA: Diagnosis not present

## 2018-01-01 DIAGNOSIS — N189 Chronic kidney disease, unspecified: Secondary | ICD-10-CM | POA: Diagnosis present

## 2018-01-01 DIAGNOSIS — F0390 Unspecified dementia without behavioral disturbance: Secondary | ICD-10-CM

## 2018-01-01 DIAGNOSIS — E860 Dehydration: Principal | ICD-10-CM | POA: Diagnosis present

## 2018-01-01 DIAGNOSIS — Z515 Encounter for palliative care: Secondary | ICD-10-CM

## 2018-01-01 DIAGNOSIS — E43 Unspecified severe protein-calorie malnutrition: Secondary | ICD-10-CM | POA: Diagnosis not present

## 2018-01-01 DIAGNOSIS — D509 Iron deficiency anemia, unspecified: Secondary | ICD-10-CM | POA: Diagnosis present

## 2018-01-01 DIAGNOSIS — R0902 Hypoxemia: Secondary | ICD-10-CM | POA: Diagnosis not present

## 2018-01-01 DIAGNOSIS — D631 Anemia in chronic kidney disease: Secondary | ICD-10-CM | POA: Diagnosis present

## 2018-01-01 DIAGNOSIS — I10 Essential (primary) hypertension: Secondary | ICD-10-CM

## 2018-01-01 DIAGNOSIS — E871 Hypo-osmolality and hyponatremia: Secondary | ICD-10-CM | POA: Diagnosis present

## 2018-01-01 DIAGNOSIS — Z7189 Other specified counseling: Secondary | ICD-10-CM

## 2018-01-01 DIAGNOSIS — D329 Benign neoplasm of meninges, unspecified: Secondary | ICD-10-CM | POA: Diagnosis present

## 2018-01-01 DIAGNOSIS — Z800 Family history of malignant neoplasm of digestive organs: Secondary | ICD-10-CM | POA: Diagnosis not present

## 2018-01-01 DIAGNOSIS — R404 Transient alteration of awareness: Secondary | ICD-10-CM | POA: Diagnosis not present

## 2018-01-01 DIAGNOSIS — Z6821 Body mass index (BMI) 21.0-21.9, adult: Secondary | ICD-10-CM

## 2018-01-01 DIAGNOSIS — N183 Chronic kidney disease, stage 3 (moderate): Secondary | ICD-10-CM | POA: Diagnosis not present

## 2018-01-01 LAB — LACTIC ACID, PLASMA
LACTIC ACID, VENOUS: 0.9 mmol/L (ref 0.5–1.9)
Lactic Acid, Venous: 1.4 mmol/L (ref 0.5–1.9)

## 2018-01-01 LAB — BASIC METABOLIC PANEL
ANION GAP: 9 (ref 5–15)
BUN: 56 mg/dL — ABNORMAL HIGH (ref 8–23)
CALCIUM: 10 mg/dL (ref 8.9–10.3)
CO2: 23 mmol/L (ref 22–32)
CREATININE: 2.41 mg/dL — AB (ref 0.44–1.00)
Chloride: 97 mmol/L — ABNORMAL LOW (ref 98–111)
GFR, EST AFRICAN AMERICAN: 20 mL/min — AB (ref >60)
GFR, EST NON AFRICAN AMERICAN: 17 mL/min — AB (ref >60)
Glucose, Bld: 133 mg/dL — ABNORMAL HIGH (ref 70–99)
Potassium: 4.9 mmol/L (ref 3.5–5.1)
Sodium: 129 mmol/L — ABNORMAL LOW (ref 135–145)

## 2018-01-01 LAB — CBC
HCT: 28.7 % — ABNORMAL LOW (ref 36.0–46.0)
Hemoglobin: 9.1 g/dL — ABNORMAL LOW (ref 12.0–15.0)
MCH: 31.1 pg (ref 26.0–34.0)
MCHC: 31.7 g/dL (ref 30.0–36.0)
MCV: 98 fL (ref 80.0–100.0)
NRBC: 0 % (ref 0.0–0.2)
PLATELETS: 410 10*3/uL — AB (ref 150–400)
RBC: 2.93 MIL/uL — AB (ref 3.87–5.11)
RDW: 12.5 % (ref 11.5–15.5)
WBC: 4 10*3/uL (ref 4.0–10.5)

## 2018-01-01 LAB — URINALYSIS, ROUTINE W REFLEX MICROSCOPIC
Bilirubin Urine: NEGATIVE
GLUCOSE, UA: NEGATIVE mg/dL
HGB URINE DIPSTICK: NEGATIVE
Ketones, ur: NEGATIVE mg/dL
LEUKOCYTES UA: NEGATIVE
Nitrite: NEGATIVE
PROTEIN: NEGATIVE mg/dL
Specific Gravity, Urine: 1.01 (ref 1.005–1.030)
pH: 5 (ref 5.0–8.0)

## 2018-01-01 LAB — CBG MONITORING, ED: GLUCOSE-CAPILLARY: 120 mg/dL — AB (ref 70–99)

## 2018-01-01 LAB — TROPONIN I: Troponin I: <0.03 ng/mL (ref <0.03)

## 2018-01-01 MED ORDER — HEPARIN SODIUM (PORCINE) 5000 UNIT/ML IJ SOLN
5000.0000 [IU] | Freq: Three times a day (TID) | INTRAMUSCULAR | Status: DC
  Administered 2018-01-01 – 2018-01-03 (×6): 5000 [IU] via SUBCUTANEOUS
  Filled 2018-01-01 (×6): qty 1

## 2018-01-01 MED ORDER — ONDANSETRON HCL 4 MG/2ML IJ SOLN: 4.0000 mg | Freq: Four times a day (QID) | INTRAMUSCULAR | Status: DC | PRN

## 2018-01-01 MED ORDER — DONEPEZIL HCL 5 MG PO TABS
10.0000 mg | ORAL_TABLET | Freq: Every day | ORAL | Status: DC
  Administered 2018-01-01 – 2018-01-02 (×2): 10 mg via ORAL
  Filled 2018-01-01 (×2): qty 2
  Filled 2018-01-01 (×2): qty 1

## 2018-01-01 MED ORDER — NYSTATIN 100000 UNIT/ML MT SUSP
5.0000 mL | Freq: Four times a day (QID) | OROMUCOSAL | Status: DC
  Administered 2018-01-01 – 2018-01-03 (×8): 500000 [IU] via ORAL
  Filled 2018-01-01 (×8): qty 5

## 2018-01-01 MED ORDER — ONDANSETRON HCL 4 MG PO TABS: 4.0000 mg | ORAL_TABLET | Freq: Four times a day (QID) | ORAL | Status: DC | PRN

## 2018-01-01 MED ORDER — ACETAMINOPHEN 650 MG RE SUPP: 650.0000 mg | Freq: Four times a day (QID) | RECTAL | Status: DC | PRN

## 2018-01-01 MED ORDER — SODIUM CHLORIDE 0.9 % IV SOLN
INTRAVENOUS | Status: DC
  Administered 2018-01-01 – 2018-01-02 (×4): via INTRAVENOUS

## 2018-01-01 MED ORDER — ACETAMINOPHEN 325 MG PO TABS: 650.0000 mg | ORAL_TABLET | Freq: Four times a day (QID) | ORAL | Status: DC | PRN

## 2018-01-01 MED ORDER — TRAZODONE HCL 50 MG PO TABS
25.0000 mg | ORAL_TABLET | Freq: Every day | ORAL | Status: DC
  Administered 2018-01-01 – 2018-01-02 (×2): 25 mg via ORAL
  Filled 2018-01-01 (×2): qty 1

## 2018-01-01 MED ORDER — LEVETIRACETAM 250 MG PO TABS
250.0000 mg | ORAL_TABLET | Freq: Two times a day (BID) | ORAL | Status: DC
  Administered 2018-01-01 – 2018-01-03 (×4): 250 mg via ORAL
  Filled 2018-01-01 (×4): qty 1

## 2018-01-01 NOTE — H&P (Signed)
History and Physical    Tina Kline:[id number] DOB: 1929/11/17 DOA: 01/01/2018  PCP: [name], MD   Patient coming from: Home  Chief Complaint: Passing out  HPI: Nile Riggs is a 84 y.o. female with medical history significant for dementia, chronic renal disease, who was the ED by EMS with reports of passing out earlier at about 11a.m. Patients daughter who is her care-giver- [name], got patient sit up in bed, then patient passed out,for a few seconds and resumed consciousness after she lay patient back down.  Patient's daughter also reports some shaking of patient's left upper extremity only, with eyes rolled up into her head, but deny postictal symptoms of prolonged unconsciousness or snoring respirations.  Family reports gradaully decline in functional status over the past weeks/months, with poor PO intake, increasing weakness, now requiring assistance with all ADLs. Patient has been taking only ensure ~3 times a day. Refusing water or any other meal. PCP is working on placement in long-term care.  On EMS arrival, blood pressure- 62/40.  Patient to me denies chest pain, headache, SOB, no cough, no fevers or chills, No dysuria. At baseline, she ambulates with a walker, can recognize family members, answer simple questions most times.    ED Course: systolic down to 93, started on N/s infusion with improvement in blood pressure to 315Q systolic.  UA clean.  Port chest x-ray negative for acute abnormality, probable hiatal hernia.  CT small meningiomas without progression, advanced chronic small vessel ischemia.  Sodium 129 chronically low, Cr  2.4 suggesting advancing CKD,, elevated BUN 56.  Chronic stable anemia 9.  Hospitalist to admit for syncope.  Review of Systems: As per HPI other systems reviewed and negative  Past Medical History:  Diagnosis Date  . Anemia   . Dementia ([place])   . Hypertension   . Seizures ([place])     Past Surgical History:  Procedure Laterality  Date  . APPENDECTOMY    . BIOPSY  06/22/2017   Procedure: BIOPSY;  Surgeon: Daneil Dolin, MD;  [facility];  Service: Endoscopy;;  gastric   . COLONOSCOPY N/A 06/26/2015   Dr. Oneida Alar: Single tubular adenoma removed.  No future colonoscopies for surveillance purposes given age.  . ESOPHAGEAL DILATION N/A 06/22/2017   Procedure: ESOPHAGEAL DILATION;  Surgeon: Daneil Dolin, MD;  [facility];  Service: Endoscopy;  Laterality: N/A;  . ESOPHAGOGASTRODUODENOSCOPY N/A 01/29/2016   Dr. Oneida Alar: proximal esophageal web s/p dilation, large hiatal hernia, gastritis  . ESOPHAGOGASTRODUODENOSCOPY N/A 03/08/2016   Dr. Oneida Alar: proximal esophagel web s/p dilation, gastritis, moderate hiatal hernia  . ESOPHAGOGASTRODUODENOSCOPY  06/2015   Dr. Oneida Alar: Large hiatal hernia, gastritis, proximal esophageal web status post dilation  . ESOPHAGOGASTRODUODENOSCOPY (EGD) WITH PROPOFOL N/A 06/22/2017   Procedure: ESOPHAGOGASTRODUODENOSCOPY (EGD) WITH PROPOFOL;  Surgeon: Daneil Dolin, MD;  [facility];  Service: Endoscopy;  Laterality: N/A;  . MOUTH SURGERY    . SAVORY DILATION N/A 01/29/2016   Procedure: SAVORY DILATION;  Surgeon: Danie Binder, MD;  [facility];  Service: Endoscopy;  Laterality: N/A;  . SAVORY DILATION N/A 03/08/2016   Procedure: SAVORY DILATION;  Surgeon: Danie Binder, MD;  [facility];  Service: Endoscopy;  Laterality: N/A;     reports that she has never smoked. She has never used smokeless tobacco. She reports that she does not drink alcohol or use drugs.  No Known Allergies  Family History  Problem Relation Age of Onset  .  Breast cancer Daughter   . Pancreatic cancer Son   . Colon cancer Son     Prior to Admission medications   Medication Sig Start Date End Date Taking? Authorizing Provider  acetaminophen (TYLENOL) 500 MG tablet Take 500 mg by mouth every 6 (six) hours as needed for mild pain or headache.   Yes [provider]  donepezil (ARICEPT) 10 MG tablet Take 10 mg by mouth at bedtime.   Yes [provider]  hydrochlorothiazide (HYDRODIURIL) 25 MG tablet Take 25 mg by mouth daily.   Yes [provider]  levETIRAcetam (KEPPRA) 250 MG tablet Take 1 tablet (250 mg total) by mouth 2 (two) times daily. 06/23/17  Yes Isaac Bliss, Rayford Halsted, MD  lisinopril (PRINIVIL,ZESTRIL) 40 MG tablet Take 40 mg by mouth daily.   Yes [provider]  spironolactone (ALDACTONE) 25 MG tablet Take 25 mg by mouth daily.   Yes [provider]  ferrous sulfate 325 (65 FE) MG EC tablet Take 1 tablet (325 mg total) by mouth 2 (two) times daily. 06/23/17 06/23/18  Isaac Bliss, Rayford Halsted, MD  hydrALAZINE (APRESOLINE) 25 MG tablet Take 25 mg by mouth 2 (two) times daily.     [provider]    Physical Exam: Exam limited by patient's dementia but patient cooperative Vitals:   01/01/18 1836 01/01/18 1845 01/01/18 1900 01/01/18 1915  BP: (!) 102/52  (!) 106/48   Pulse: 71 88 89 89  Resp: (!) 22 13 12 14   Temp:      TempSrc:      SpO2: 100% 100% 100% 100%  Weight:      Height:        Constitutional: calm, comfortable, Chronically ill-appearing Vitals:   01/01/18 1836 01/01/18 1845 01/01/18 1900 01/01/18 1915  BP: (!) 102/52  (!) 106/48   Pulse: 71 88 89 89  Resp: (!) 22 13 12 14   Temp:      TempSrc:      SpO2: 100% 100% 100% 100%  Weight:      Height:       Eyes: PERRL, lids and conjunctivae normal ENMT: Mucous membranes are dry, with thrush Posterior pharynx clear of any exudate or lesions.Normal dentition.  Neck: normal, supple, no masses, no thyromegaly Respiratory: clear to auscultation bilaterally, no wheezing, no crackles. Normal respiratory effort. No accessory muscle use.  Cardiovascular: Regular rate and rhythm, no murmurs / rubs / gallops. No extremity edema. 2+ pedal pulses. No carotid bruits.  Abdomen: no tenderness, no masses palpated. No  hepatosplenomegaly. Bowel sounds positive.  Musculoskeletal: no clubbing / cyanosis. No joint deformity upper and lower extremities. Good ROM, no contractures. Normal muscle tone.  Skin: no rashes, lesions, ulcers. No induration Neurologic: Strength 5/5 in upper extremities, moving lower extremities spontaneously- >4/5 strenght Psychiatric: Awake and alert. Appears to answer simple questions appropriately  Labs on Admission: I have personally reviewed following labs and imaging studies  CBC: Recent Labs  Lab 01/01/18 1419  WBC 4.0  HGB 9.1*  HCT 28.7*  MCV 98.0  PLT 381*   Basic Metabolic Panel: Recent Labs  Lab 01/01/18 1419  NA 129*  K 4.9  CL 97*  CO2 23  GLUCOSE 133*  BUN 56*  CREATININE 2.41*  CALCIUM 10.0   Cardiac Enzymes: Recent Labs  Lab 01/01/18 1541  TROPONINI <0.03   CBG: Recent Labs  Lab 01/01/18 1525  GLUCAP 120*   Urine analysis:    Component Value Date/Time   COLORURINE YELLOW  01/01/2018 [place] 01/01/2018 1404   LABSPEC 1.010 01/01/2018 1404   PHURINE 5.0 01/01/2018 1404   GLUCOSEU NEGATIVE 01/01/2018 1404   HGBUR NEGATIVE 01/01/2018 1404   BILIRUBINUR NEGATIVE 01/01/2018 1404   KETONESUR NEGATIVE 01/01/2018 1404   PROTEINUR NEGATIVE 01/01/2018 1404   UROBILINOGEN 0.2 01/15/2013 0425   NITRITE NEGATIVE 01/01/2018 1404   LEUKOCYTESUR NEGATIVE 01/01/2018 1404    Radiological Exams on Admission: Dg Chest 1 View  Result Date: 01/01/2018 CLINICAL DATA:  Syncopal episodes at home this morning, hypertension, dementia EXAM: CHEST  1 VIEW COMPARISON:  06/21/2017 FINDINGS: Lordotic positioning. Normal heart size, mediastinal contours, and pulmonary vascularity. Probable small hiatal hernia. Atherosclerotic calcification aorta. Lungs grossly clear. No infiltrate, pleural effusion or pneumothorax. Skin folds project over LEFT chest. Bones demineralized. IMPRESSION: Probable hiatal hernia. No acute abnormalities. Electronically  Signed   By: Lavonia Dana M.D.   On: 01/01/2018 16:33   Ct Head Wo Contrast  Result Date: 01/01/2018 CLINICAL DATA:  Recurrent syncope EXAM: CT HEAD WITHOUT CONTRAST TECHNIQUE: Contiguous axial images were obtained from the base of the skull through the vertex without intravenous contrast. COMPARISON:  Brain MRI 06/21/2017 FINDINGS: Brain: No evidence of acute infarction, hemorrhage, hydrocephalus, extra-axial collection or mass lesion/mass effect. Chronic small-vessel disease with confluent gliosis in the cerebral white matter. Generalized atrophy. Partially calcified meningioma along the left parietal convexity measuring 13 mm. Small hemisphere nodule measuring 5 mm, also stable and consistent with meningioma. Additional partially calcified probable meningioma along the right from convexity measuring 6 mm. Vascular: Atherosclerotic calcification Skull: Negative Sinuses/Orbits: Dysconjugate gaze, nonspecific IMPRESSION: 1. No acute finding. 2. Advanced chronic small vessel ischemia. 3. Small meningiomas without progression. Electronically Signed   By: Monte Fantasia M.D.   On: 01/01/2018 16:48    EKG: Independently reviewed.  Artifact.  But no appreciable ST or T wave abnormalities.   Assessment/Plan Principal Problem:   Syncope Active Problems:   Chronic renal disease   Dementia (H[facility])   Syncope-with initial hypotension bp- 62/40 per EMS.  From poor p.o. Intake.  Improving with IV fluids.  UA and chest x-ray negative for infectious etiology.  Lactic acid normal. Chest x-ray head CT chronic changes.  Admission 06/2017 for syncope though 2/2 to anemia- hgb 6.4.  Work-up included echo EF 55 to 60%, G1DD.  MRI-meningioma.  Seen by neurology started on seizure prophylaxis with Keppra. -Monitor on telemetry -Defer echo at this time - trend [place] - Hydrate  N/s 100cc/hr  - MAg  Dementia-likely progression.  Poor p.o. intake increased assistance with ADLs. CT head-advanced chronic small vessel  ischemia.  -PT eval -Continue home donepezil -Swallow evaluation -Continue seizure prophylaxis with Keppra  CKD-creatinine 2.4 gradual downtrend in renal function over the past months.  Possible component of acute kidney injury. Does not follow with a nephrologist.  - hydrate - BMP a.m  Chronic anemia- Hgb 9.  -Monitor  DVT prophylaxis:  Heparin Code Status: Full Family Communication: Daughter [name] at bedside Disposition Plan: Per rounding team Consults called: None Admission status: Obs, tele   Bethena Roys MD [facility] Pager [phone] From 3PM-11PM.  Otherwise please contact night-coverage [email or web address] Password [facility]  01/01/2018, 10:43 PM

## 2018-01-01 NOTE — ED Triage Notes (Signed)
Patient brought in by EMS from home, family states patient has had syncopal episodes at home this morning. States patient was in bed with blood pressure of 62/40, but got patient up and moved to chair, increased 149/93. Patient denies pain.

## 2018-01-01 NOTE — Progress Notes (Signed)
Questions were answered by daughter.

## 2018-01-01 NOTE — ED Provider Notes (Signed)
[facility] EMERGENCY DEPARTMENT Provider Note   CSN: [id number] Arrival date & time: 01/01/18  1333     History   Chief Complaint Chief Complaint  Patient presents with  . Loss of Consciousness    HPI [name] is a 84 y.o. female.  The history is provided by the EMS personnel, a relative and a caregiver. The history is limited by the condition of the patient (Hx dementia).  Loss of Consciousness    Pt was seen at 1515. Per EMS and pt's caregivers: Pt's family was dressing pt while she was sitting on the bed, when they stood her to walk to the next room she had a syncopal episode. Pt's family states they placed her back on the bed. EMS states pt's BP was "62/40." Pt's family states pt has been generally weak for the past several days and not taking PO. Denies seizure activity, no focal motor weakness, no vomiting/diarrhea, no fevers, no rash, no SOB/cough. Pt's family states they are working with PMD to place pt in NH. Pt herself has hx of dementia.   Past Medical History:  Diagnosis Date  . Anemia   . Dementia ([place])   . Hypertension   . Seizures [facility])     Patient Active Problem List   Diagnosis Date Noted  . H. pylori infection 09/27/2017  . Dark stools 09/27/2017  . Syncope 06/20/2017  . Anemia   . Dysphagia 01/20/2016  . IDA (iron deficiency anemia) 06/08/2015  . Chronic renal disease 06/08/2015  . Arthritis of knee, degenerative 06/13/2013    Past Surgical History:  Procedure Laterality Date  . APPENDECTOMY    . BIOPSY  06/22/2017   Procedure: BIOPSY;  Surgeon: Daneil Dolin, MD;  [facility];  Service: Endoscopy;;  gastric   . COLONOSCOPY N/A 06/26/2015   Dr. Oneida Alar: Single tubular adenoma removed.  No future colonoscopies for surveillance purposes given age.  . ESOPHAGEAL DILATION N/A 06/22/2017   Procedure: ESOPHAGEAL DILATION;  Surgeon: Daneil Dolin, MD;  [facility];  Service: Endoscopy;  Laterality: N/A;  .  ESOPHAGOGASTRODUODENOSCOPY N/A 01/29/2016   Dr. Oneida Alar: proximal esophageal web s/p dilation, large hiatal hernia, gastritis  . ESOPHAGOGASTRODUODENOSCOPY N/A 03/08/2016   Dr. Oneida Alar: proximal esophagel web s/p dilation, gastritis, moderate hiatal hernia  . ESOPHAGOGASTRODUODENOSCOPY  06/2015   Dr. Oneida Alar: Large hiatal hernia, gastritis, proximal esophageal web status post dilation  . ESOPHAGOGASTRODUODENOSCOPY (EGD) WITH PROPOFOL N/A 06/22/2017   Procedure: ESOPHAGOGASTRODUODENOSCOPY (EGD) WITH PROPOFOL;  Surgeon: Daneil Dolin, MD;  [facility];  Service: Endoscopy;  Laterality: N/A;  . MOUTH SURGERY    . SAVORY DILATION N/A 01/29/2016   Procedure: SAVORY DILATION;  Surgeon: Danie Binder, MD;  [facility];  Service: Endoscopy;  Laterality: N/A;  . SAVORY DILATION N/A 03/08/2016   Procedure: SAVORY DILATION;  Surgeon: Danie Binder, MD;  [facility];  Service: Endoscopy;  Laterality: N/A;     OB History    Gravida  6   Para  6   Term  6   Preterm      AB      Living  4     SAB      TAB      Ectopic      Multiple      Live Births               Home Medications    Prior to Admission medications  Medication Sig Start Date End Date Taking? Authorizing Provider  acetaminophen (TYLENOL) 500 MG tablet Take 500 mg by mouth every 6 (six) hours as needed for mild pain or headache.   Yes [provider]  donepezil (ARICEPT) 10 MG tablet Take 10 mg by mouth at bedtime.   Yes [provider]  hydrochlorothiazide (HYDRODIURIL) 25 MG tablet Take 25 mg by mouth daily.   Yes [provider]  levETIRAcetam (KEPPRA) 250 MG tablet Take 1 tablet (250 mg total) by mouth 2 (two) times daily. 06/23/17  Yes Isaac Bliss, Rayford Halsted, MD  lisinopril (PRINIVIL,ZESTRIL) 40 MG tablet Take 40 mg by mouth daily.   Yes [provider]  spironolactone (ALDACTONE) 25 MG tablet Take 25 mg by mouth daily.   Yes [provider]  ferrous sulfate 325 (65 FE) MG EC tablet Take 1 tablet (325 mg total) by mouth 2 (two) times daily. 06/23/17 06/23/18  Isaac Bliss, Rayford Halsted, MD  hydrALAZINE (APRESOLINE) 25 MG tablet Take 25 mg by mouth 2 (two) times daily.     [provider]    Family History Family History  Problem Relation Age of Onset  . Breast cancer Daughter   . Pancreatic cancer Son   . Colon cancer Son     Social History Social History   Tobacco Use  . Smoking status: Never Smoker  . Smokeless tobacco: Never Used  Substance Use Topics  . Alcohol use: No  . Drug use: No     Allergies   Patient has no known allergies.   Review of Systems Review of Systems  Unable to perform ROS: Dementia  Cardiovascular: Positive for syncope.     Physical Exam Updated Vital Signs BP 125/64   Pulse 84   Temp (!) 97.3 F (36.3 C) (Oral)   Resp 12   Ht 5\' 1"  (1.549 m)   Wt 51.2 kg   SpO2 100%   BMI 21.33 kg/m    Patient Vitals for the past 24 hrs:  BP Temp Temp src Pulse Resp SpO2 Height Weight  01/01/18 1800 (!) 99/52 - - 84 18 100 % - -  01/01/18 1744 (!) 116/53 - - 68 15 98 % - -  01/01/18 1730 114/61 - - 88 - 100 % - -  01/01/18 1700 115/69 - - - - - - -  01/01/18 1651 - - - - - 100 % - -  01/01/18 1630 115/65 - - 89 12 100 % - -  01/01/18 1600 100/83 - - 89 16 100 % - -  01/01/18 1530 125/64 - - 84 12 100 % - -  01/01/18 1500 124/68 - - 84 16 100 % - -  01/01/18 1448 110/61 - - 82 16 100 % - -  01/01/18 1430 (!) 92/52 - - 80 14 100 % - -  01/01/18 1338 (!) 113/57 (!) 97.3 F (36.3 C) Oral 74 20 100 % - -  01/01/18 1336 - - - - - - 5\' 1"  (1.549 m) 51.2 kg   16:51 Vital Signs JG  Orthostatic Lying   BP- Lying: 118/61   Pulse- Lying: 89       Orthostatic Sitting  BP- Sitting: 123/71   Pulse- Sitting: 88       Orthostatic Standing at 0 minutes  BP- Standing at 0 minutes: 102/70   Pulse- Standing at 0 minutes: 100       Oxygen Therapy  SpO2: 100 %  O2 Device: Room Air      Physical Exam 1520: Physical examination:  Nursing notes reviewed; Vital signs and O2 SAT reviewed;  Constitutional: Thin, frail. In no acute distress; Head:  Normocephalic, atraumatic; Eyes: EOMI, PERRL, No scleral icterus; ENMT: Mouth and pharynx normal, Mucous membranes dry; Neck: Supple, Full range of motion, No lymphadenopathy; Cardiovascular: Regular rate and rhythm, No gallop; Respiratory: Breath sounds clear & equal bilaterally, No wheezes. Normal respiratory effort/excursion; Chest: Nontender, Movement normal; Abdomen: Soft, Nontender, Nondistended, Normal bowel sounds; Genitourinary: No CVA tenderness; Extremities: Peripheral pulses normal, No tenderness, No edema, No calf edema or asymmetry.; Neuro: Awake, alert.  No facial droop.  Speech minimal. Moves all extremities spontaneously on stretcher without apparent gross focal motor deficits, does not follow commands consistently.;; Skin: Color normal, Warm, Dry.   ED Treatments / Results  Labs (all labs ordered are listed, but only abnormal results are displayed)   EKG EKG Interpretation  Date/Time:  Monday January 01 2018 13:55:31 EST Ventricular Rate:  79 PR Interval:    QRS Duration: 84 QT Interval:  348 QTC Calculation: 399 R Axis:   54 Text Interpretation:  Interpretation limited secondary to artifact Confirmed by Virgel Manifold [phone]) on 01/01/2018 2:36:13 PM   Radiology   Procedures Procedures (including critical care time)  Medications Ordered in ED Medications  0.9 %  sodium chloride infusion (has no administration in time range)     Initial Impression / Assessment and Plan / ED Course  I have reviewed the triage vital signs and the nursing notes.  Pertinent labs & imaging results that were available during my care of the patient were reviewed by me and considered in my medical decision making (see chart for details).  MDM Reviewed: previous chart, nursing note and  vitals Reviewed previous: labs and ECG Interpretation: labs, ECG, x-ray and CT scan Total time providing critical care: 30-74 minutes. This excludes time spent performing separately reportable procedures and services. Consults: admitting MD    CRITICAL CARE Performed by: Francine Graven Total critical care time: 35 minutes Critical care time was exclusive of separately billable procedures and treating other patients. Critical care was necessary to treat or prevent imminent or life-threatening deterioration. Critical care was time spent personally by me on the following activities: development of treatment plan with patient and/or surrogate as well as nursing, discussions with consultants, evaluation of patient's response to treatment, examination of patient, obtaining history from patient or surrogate, ordering and performing treatments and interventions, ordering and review of laboratory studies, ordering and review of radiographic studies, pulse oximetry and re-evaluation of patient's condition.    Results for orders placed or performed during the hospital encounter of 16/07/37  Basic metabolic panel  Result Value Ref Range   Sodium 129 (L) 135 - 145 mmol/L   Potassium 4.9 3.5 - 5.1 mmol/L   Chloride 97 (L) 98 - 111 mmol/L   CO2 23 22 - 32 mmol/L   Glucose, Bld 133 (H) 70 - 99 mg/dL   BUN 56 (H) 8 - 23 mg/dL   Creatinine, Ser 2.41 (H) 0.44 - 1.00 mg/dL   Calcium 10.0 8.9 - 10.3 mg/dL   GFR calc non Af Amer 17 (L) >60 mL/min   GFR calc Af Amer 20 (L) >60 mL/min   Anion gap 9 5 - 15  CBC  Result Value Ref Range   WBC 4.0 4.0 - 10.5 K/uL   RBC 2.93 (L) 3.87 - 5.11 MIL/uL   Hemoglobin 9.1 (L) 12.0 -  15.0 g/dL   HCT 28.7 (L) 36.0 - 46.0 %   MCV 98.0 80.0 - 100.0 fL   MCH 31.1 26.0 - 34.0 pg   MCHC 31.7 30.0 - 36.0 g/dL   RDW 12.5 11.5 - 15.5 %   Platelets 410 (H) 150 - 400 K/uL   nRBC 0.0 0.0 - 0.2 %  Urinalysis, Routine w reflex microscopic  Result Value Ref Range   Color,  Urine YELLOW YELLOW   APPearance CLEAR CLEAR   Specific Gravity, Urine 1.010 1.005 - 1.030   pH 5.0 5.0 - 8.0   Glucose, UA NEGATIVE NEGATIVE mg/dL   Hgb urine dipstick NEGATIVE NEGATIVE   Bilirubin Urine NEGATIVE NEGATIVE   Ketones, ur NEGATIVE NEGATIVE mg/dL   Protein, ur NEGATIVE NEGATIVE mg/dL   Nitrite NEGATIVE NEGATIVE   Leukocytes, UA NEGATIVE NEGATIVE  Troponin I - Once  Result Value Ref Range   Troponin I <0.03 <0.03 ng/mL  Lactic acid, plasma  Result Value Ref Range   Lactic Acid, Venous 1.4 0.5 - 1.9 mmol/L  CBG monitoring, ED  Result Value Ref Range   Glucose-Capillary 120 (H) 70 - 99 mg/dL   Dg Chest 1 View Result Date: 01/01/2018 CLINICAL DATA:  Syncopal episodes at home this morning, hypertension, dementia EXAM: CHEST  1 VIEW COMPARISON:  06/21/2017 FINDINGS: Lordotic positioning. Normal heart size, mediastinal contours, and pulmonary vascularity. Probable small hiatal hernia. Atherosclerotic calcification aorta. Lungs grossly clear. No infiltrate, pleural effusion or pneumothorax. Skin folds project over LEFT chest. Bones demineralized. IMPRESSION: Probable hiatal hernia. No acute abnormalities. Electronically Signed   By: Lavonia Dana M.D.   On: 01/01/2018 16:33   Ct Head Wo Contrast Result Date: 01/01/2018 CLINICAL DATA:  Recurrent syncope EXAM: CT HEAD WITHOUT CONTRAST TECHNIQUE: Contiguous axial images were obtained from the base of the skull through the vertex without intravenous contrast. COMPARISON:  Brain MRI 06/21/2017 FINDINGS: Brain: No evidence of acute infarction, hemorrhage, hydrocephalus, extra-axial collection or mass lesion/mass effect. Chronic small-vessel disease with confluent gliosis in the cerebral white matter. Generalized atrophy. Partially calcified meningioma along the left parietal convexity measuring 13 mm. Small hemisphere nodule measuring 5 mm, also stable and consistent with meningioma. Additional partially calcified probable meningioma along  the right from convexity measuring 6 mm. Vascular: Atherosclerotic calcification Skull: Negative Sinuses/Orbits: Dysconjugate gaze, nonspecific IMPRESSION: 1. No acute finding. 2. Advanced chronic small vessel ischemia. 3. Small meningiomas without progression. Electronically Signed   By: Monte Fantasia M.D.   On: 01/01/2018 16:48   Results for MERIKAY, LESNIEWSKI (MRN [id number]) as of 01/01/2018 18:08  Ref. Range 06/22/2017 05:42 06/23/2017 08:41 10/15/2017 12:01 12/03/2017 12:06 01/01/2018 14:19  Sodium Latest Ref Range: 135 - 145 mmol/L 136 136 122 (L) 123 (L) 129 (L)   Results for MESA, JANUS (MRN [id number]) as of 01/01/2018 18:08  Ref. Range 06/22/2017 05:42 06/23/2017 08:41 10/15/2017 12:01 12/03/2017 12:06 01/01/2018 14:19  BUN Latest Ref Range: 8 - 23 mg/dL 16 12 19 25  (H) 56 (H)  Creatinine Latest Ref Range: 0.44 - 1.00 mg/dL 1.40 (H) 1.36 (H) 1.82 (H) 2.02 (H) 2.41 (H)     1815:   Hyponatremia on labs. BUN/Cr elevated from baseline. Pt orthostatic on VS. Judicious IVF given. Family very concerned regarding taking pt home tonight.  T/C returned from [facility] Denton Brick, case discussed, including:  HPI, pertinent PM/SHx, VS/PE, dx testing, ED course and treatment:  Agreeable to admit.      Final Clinical Impressions(s) / ED Diagnoses   Final  diagnoses:  None    ED Discharge Orders    None       Francine Graven, DO 01/04/18 1638

## 2018-01-02 ENCOUNTER — Observation Stay (HOSPITAL_BASED_OUTPATIENT_CLINIC_OR_DEPARTMENT_OTHER): Payer: Medicare Other

## 2018-01-02 ENCOUNTER — Observation Stay (HOSPITAL_COMMUNITY)
Admit: 2018-01-02 | Discharge: 2018-01-02 | Disposition: A | Discharge status: Home / Self Care | Payer: Medicare Other | Attending: Internal Medicine | Admitting: Internal Medicine

## 2018-01-02 ENCOUNTER — Encounter (HOSPITAL_COMMUNITY): Payer: Self-pay | Admitting: Primary Care

## 2018-01-02 DIAGNOSIS — R55 Syncope and collapse: Secondary | ICD-10-CM

## 2018-01-02 DIAGNOSIS — F0390 Unspecified dementia without behavioral disturbance: Secondary | ICD-10-CM

## 2018-01-02 DIAGNOSIS — E860 Dehydration: Secondary | ICD-10-CM | POA: Diagnosis not present

## 2018-01-02 DIAGNOSIS — N179 Acute kidney failure, unspecified: Secondary | ICD-10-CM | POA: Diagnosis not present

## 2018-01-02 DIAGNOSIS — E871 Hypo-osmolality and hyponatremia: Secondary | ICD-10-CM

## 2018-01-02 DIAGNOSIS — N183 Chronic kidney disease, stage 3 (moderate): Secondary | ICD-10-CM | POA: Diagnosis not present

## 2018-01-02 DIAGNOSIS — Z7189 Other specified counseling: Secondary | ICD-10-CM

## 2018-01-02 DIAGNOSIS — Z515 Encounter for palliative care: Secondary | ICD-10-CM

## 2018-01-02 DIAGNOSIS — R627 Adult failure to thrive: Secondary | ICD-10-CM

## 2018-01-02 DIAGNOSIS — I350 Nonrheumatic aortic (valve) stenosis: Secondary | ICD-10-CM | POA: Diagnosis not present

## 2018-01-02 DIAGNOSIS — D509 Iron deficiency anemia, unspecified: Secondary | ICD-10-CM | POA: Diagnosis not present

## 2018-01-02 LAB — ECHOCARDIOGRAM COMPLETE
HEIGHTINCHES: 61 in
Weight: 1806.01 oz

## 2018-01-02 LAB — BASIC METABOLIC PANEL
Anion gap: 8 (ref 5–15)
BUN: 51 mg/dL — AB (ref 8–23)
CALCIUM: 9.3 mg/dL (ref 8.9–10.3)
CHLORIDE: 104 mmol/L (ref 98–111)
CO2: 21 mmol/L — ABNORMAL LOW (ref 22–32)
CREATININE: 2.02 mg/dL — AB (ref 0.44–1.00)
GFR calc non Af Amer: 21 mL/min — ABNORMAL LOW (ref >60)
GFR, EST AFRICAN AMERICAN: 25 mL/min — AB (ref >60)
Glucose, Bld: 92 mg/dL (ref 70–99)
Potassium: 5 mmol/L (ref 3.5–5.1)
SODIUM: 133 mmol/L — AB (ref 135–145)

## 2018-01-02 LAB — CK: CK TOTAL: 249 U/L — AB (ref 38–234)

## 2018-01-02 LAB — TSH: TSH: 0.951 u[IU]/mL (ref 0.350–4.500)

## 2018-01-02 LAB — VITAMIN B12: Vitamin B-12: 506 pg/mL (ref 180–914)

## 2018-01-02 LAB — MAGNESIUM: MAGNESIUM: 2.3 mg/dL (ref 1.7–2.4)

## 2018-01-02 LAB — TROPONIN I

## 2018-01-02 LAB — T4, FREE: Free T4: 1.15 ng/dL (ref 0.82–1.77)

## 2018-01-02 MED ORDER — DEXAMETHASONE 4 MG PO TABS: 4.0000 mg | ORAL_TABLET | Freq: Every day | ORAL | Status: DC

## 2018-01-02 MED ORDER — ENSURE ENLIVE PO LIQD
237.0000 mL | Freq: Three times a day (TID) | ORAL | Status: DC
  Administered 2018-01-02 – 2018-01-03 (×4): 237 mL via ORAL

## 2018-01-02 MED ORDER — FERROUS SULFATE 325 (65 FE) MG PO TABS
325.0000 mg | ORAL_TABLET | Freq: Two times a day (BID) | ORAL | Status: DC
  Administered 2018-01-02 – 2018-01-03 (×4): 325 mg via ORAL
  Filled 2018-01-02 (×4): qty 1

## 2018-01-02 NOTE — Consult Note (Signed)
Consultation Note Date: 01/02/2018   Patient Name: Tina Kline  DOB: 1929-07-30  MRN: [id number]  Age / Sex: 84 y.o., female  PCP: Tina Fire, MD Referring Physician: Orson Eva, MD  Reason for Consultation: Establishing goals of care and Psychosocial/spiritual support  HPI/Patient Profile: 84 y.o. female  with past medical history of dementia, chronic renal disease admitted on 01/01/2018 with syncope secondary to hypotension and vagal reaction.  PMT consulted for goals of care.   Clinical Assessment and Goals of Care: [name] is resting quietly in bed, she makes and briefly keeps eye contact. Her daughter Tina Kline is at bed side.   Tina Kline tell me that Tina Kline has lived in her home for the last 4 years, over the last few months she has needed more and more care.  Tina Kline states that her daughters and sisters help sometimes but she relies recently this was too much and had been working with PCP, Dr. Legrand Kline for long-term placement.  Tina Kline tells me that she has applied for Medicaid for Tina Kline and had been in contact with several skilled nursing facilities trying to find placement.  We talked about social work consult here at the hospital assisting with placement.  We talked about the chronic illness pathway for dementia.  I share a diagram of this pathway, what is normal and expected.  We talked about decreased by mouth intake.  Tina Kline states that her mother is down to 108 pounds.  We talked about PEG tube placement.  Tina Kline states that the family has decided to NOT offer supplemental feeding, let God's will happen.  Psychosocial support provided to Tina Kline and Tina Kline.  We do not further discuss CODE STATUS today.  I plan for follow-up tomorrow, continued relationship building, CODE STATUS discussion.  Healthcare power of attorney NEXT OF KIN - daughter Tina Kline, daughters Tina Kline and  Tina Kline.    SUMMARY OF RECOMMENDATIONS   Rehab then long term care NO PEG tube  Code Status/Advance Care Planning:  Full code  Symptom Management:   Per hospitalist, no additional needs at this time.  Palliative Prophylaxis:   Aspiration and Turn Reposition  Additional Recommendations (Limitations, Scope, Preferences):  No PEG tube  Psycho-social/Spiritual:   Desire for further Chaplaincy support:no  Additional Recommendations: Caregiving  Support/Resources and Education on Hospice  Prognosis:   < 6 months not be surprising based on failure to thrive, poor by mouth intake, drinking mostly, not eating.  Family stating no to PEG to.  Discharge Planning: At this point, goal is short-term rehab      Primary Diagnoses: Present on Admission: . Syncope . Chronic renal disease . IDA (iron deficiency anemia)   I have reviewed the medical record, interviewed the patient and family, and examined the patient. The following aspects are pertinent.  Past Medical History:  Diagnosis Date  . Anemia   . Dementia ([place])   . Hypertension   . Seizures ([place])    Social History   Socioeconomic History  . Marital status: Widowed  Spouse name: Not on file  . Number of children: 6  . Years of education: Not on file  . Highest education level: Not on file  Occupational History  . Not on file  Social Needs  . Financial resource strain: Not on file  . Food insecurity:    Worry: Not on file    Inability: Not on file  . Transportation needs:    Medical: Not on file    Non-medical: Not on file  Tobacco Use  . Smoking status: Never Smoker  . Smokeless tobacco: Never Used  Substance and Sexual Activity  . Alcohol use: No  . Drug use: No  . Sexual activity: Not on file  Lifestyle  . Physical activity:    Days per week: Not on file    Minutes per session: Not on file  . Stress: Not on file  Relationships  . Social connections:    Talks on phone: Not on file     Gets together: Not on file    Attends religious service: Not on file    Active member of club or organization: Not on file    Attends meetings of clubs or organizations: Not on file    Relationship status: Not on file  Other Topics Concern  . Not on file  Social History Narrative  . Not on file   Family History  Problem Relation Age of Onset  . Breast cancer Daughter   . Pancreatic cancer Son   . Colon cancer Son    Scheduled Meds: . donepezil  10 mg Oral QHS  . feeding supplement (ENSURE ENLIVE)  237 mL Oral TID WC  . ferrous sulfate  325 mg Oral BID WC  . heparin  5,000 Units Subcutaneous Q8H  . levETIRAcetam  250 mg Oral BID  . nystatin  5 mL Oral QID  . traZODone  25 mg Oral QHS   Continuous Infusions: . sodium chloride 100 mL/hr at 01/02/18 0513   PRN Meds:.acetaminophen **OR** acetaminophen, ondansetron **OR** ondansetron (ZOFRAN) IV Medications Prior to Admission:  Prior to Admission medications   Medication Sig Start Date End Date Taking? Authorizing Provider  acetaminophen (TYLENOL) 500 MG tablet Take 500 mg by mouth every 6 (six) hours as needed for mild pain or headache.   Yes [provider]  donepezil (ARICEPT) 10 MG tablet Take 10 mg by mouth at bedtime.   Yes [provider]  hydrochlorothiazide (HYDRODIURIL) 25 MG tablet Take 25 mg by mouth daily.   Yes [provider]  levETIRAcetam (KEPPRA) 250 MG tablet Take 1 tablet (250 mg total) by mouth 2 (two) times daily. 06/23/17  Yes Isaac Bliss, Rayford Halsted, MD  lisinopril (PRINIVIL,ZESTRIL) 40 MG tablet Take 40 mg by mouth daily.   Yes [provider]  spironolactone (ALDACTONE) 25 MG tablet Take 25 mg by mouth daily.   Yes [provider]  ferrous sulfate 325 (65 FE) MG EC tablet Take 1 tablet (325 mg total) by mouth 2 (two) times daily. 06/23/17 06/23/18  Isaac Bliss, Rayford Halsted, MD  hydrALAZINE (APRESOLINE) 25 MG tablet Take 25 mg by mouth 2 (two) times daily.      [provider]   No Known Allergies Review of Systems  Unable to perform ROS: Dementia    Physical Exam  Constitutional: No distress.  Appears weak and frail, chronically ill  HENT:  Head: Atraumatic.  Temporal wasting  Cardiovascular: Normal rate.  Pulmonary/Chest: Effort normal. No respiratory distress.  Abdominal: Soft. She  exhibits no distension.  Neurological: She is alert.  Known dementia  Skin: Skin is warm and dry.  Psychiatric:  Known dementia  Nursing note and vitals reviewed.   Vital Signs: BP (!) 97/47   Pulse 80   Temp 97.7 F (36.5 C) (Oral)   Resp 20   Ht 5\' 1"  (1.549 m)   Wt 51.2 kg   SpO2 99%   BMI 21.33 kg/m  Pain Scale: PAINAD   Pain Score: 0-No pain   SpO2: SpO2: 99 % O2 Device:SpO2: 99 % O2 Flow Rate: .   IO: Intake/output summary:   Intake/Output Summary (Last 24 hours) at 01/02/2018 1541 Last data filed at 01/02/2018 1610 Gross per 24 hour  Intake 958.48 ml  Output 400 ml  Net 558.48 ml    LBM:   Baseline Weight: Weight: 51.2 kg Most recent weight: Weight: 51.2 kg     Palliative Assessment/Data:   Flowsheet Rows     Most Recent Value  Intake Tab  Referral Department  Hospitalist  Unit at Time of Referral  Cardiac/Telemetry Unit  Palliative Care Primary Diagnosis  Neurology  Date Notified  01/02/18  Palliative Care Type  New Palliative care  Reason for referral  Clarify Goals of Care  Date of Admission  01/01/18  Date first seen by Palliative Care  01/02/18  # of days Palliative referral response time  0 Day(s)  # of days IP prior to Palliative referral  1  Clinical Assessment  Palliative Performance Scale Score  30%  Pain Max last 24 hours  Not able to report  Pain Min Last 24 hours  Not able to report  Dyspnea Max Last 24 Hours  Not able to report  Dyspnea Min Last 24 hours  Not able to report  Psychosocial & Spiritual Assessment  Palliative Care Outcomes  Patient/Family meeting held?  Yes  Who was at  the meeting?  Patient at bedside      Time In: 1450 Time Out: 1545 Time Total: 55 minutes Greater than 50%  of this time was spent counseling and coordinating care related to the above assessment and plan.  Signed by: Drue Novel, NP   Please contact Palliative Medicine Team phone at [phone] for questions and concerns.  For individual provider: See Shea Evans

## 2018-01-02 NOTE — Progress Notes (Addendum)
Initial Nutrition Assessment  DOCUMENTATION CODES:  Severe malnutrition in context of chronic illness  INTERVENTION:  Ensure Enlive po TID, each supplement provides 350 kcal and 20 grams of protein  Magic cup at [employer], each supplement provides 290 kcal and 9 grams of protein   Took list of items patient reportedly does best with. Used these to construct dinner meal. Conveyed this to [employer] on nutrition in Dementia  NUTRITION DIAGNOSIS:  Severe Malnutrition related to chronic illness(End stage dementia) as evidenced by severe muscle/fat wasting.   GOAL:  Patient will meet greater than or equal to 90% of their needs  MONITOR:  PO intake, Supplement acceptance, Labs, I & O's, Goals of care  REASON FOR ASSESSMENT:  Consult Assessment of nutrition requirement/status  ASSESSMENT:  84 y/o female PMHx dementia, CKD, HTN. Presented via EMS after syncopal episode. Pt reportedly has had gradual decline the past months with poor intake, increasing weakness and refusal of meals. Work up significant for hypotension, likely r/t dehydration. Admitted for management. RD consulted for nutrition assessment.   Patient with dementia and unable to provide any history. She has two family members at bedside. Neither of them are pt's personal caregivers, but they note they are quite familiar with pt.   They report pt has become extremely selective in what she will accept. She has recently stopped accepting many of her once favorite items.  She is largely dependant on Ensure (they believe it is ensure enlive). She drinks three of these daily. Other than these, she will accept applesauce and bites of toast "now and then", though she spit out her toast this morning. They say she only accepts foods of certain textures and consistencies, homogenous and smooth. All foods need to be uniform; she will not except yogurt w/ pieces of fruit or soup with pieces of chicken. They are unsure if the  pt took any vitamins or minerals at home.   GI wise, pt has loose stools, which they associate with her exclusive intake of Ensure. They do not believe patient took any laxatives, softeners or antidiarrheals at home.   Weight wise, they report pt weighed 105 lbs 1 month ago. Bed weight today is 108 lbs, though did not remove all objects from bed. Chart shows gradual wt decline; she was 113 1 month ago and 116 3 months ago. She was 115 this past May.   We discussed process of dementia and how intake will continue to worsen. Given she is full code, did notify them of all potential interventions, including appetite stimulants and feeding tubes, though noted neither of these show much efficacy in end stage dementia. They note understanding and were appreciate of information.   Will order Ensure TID w/ meals, since this is main source of intake. Also took list of food items patient accepts and came up with patients dinner meal. This was conveyed to [employer].   Labs: Na: 129-> 133. Bun/creat: 56/2.41-> 51/2.02, Hgb: 9.1, BG:120 Meds: Aricept, Nystatin, Iron,   Recent Labs  Lab 01/01/18 1419 01/02/18 0401  NA 129* 133*  K 4.9 5.0  CL 97* 104  CO2 23 21*  BUN 56* 51*  CREATININE 2.41* 2.02*  CALCIUM 10.0 9.3  MG  --  2.3  GLUCOSE 133* 92   NUTRITION - FOCUSED PHYSICAL EXAM:   Most Recent Value  Orbital Region  Severe depletion  Upper Arm Region  Moderate depletion  Thoracic and Lumbar Region  Severe depletion  Buccal Region  Moderate  depletion  Temple Region  Severe depletion  Clavicle Bone Region  Severe depletion  Clavicle and Acromion Bone Region  Severe depletion  Scapular Bone Region  Moderate depletion  Dorsal Hand  Moderate depletion  Patellar Region  No depletion  Anterior Thigh Region  Moderate depletion  Posterior Calf Region  Moderate depletion  Edema (RD Assessment)  None  Hair  Reviewed  Eyes  Reviewed  Mouth  Reviewed  Skin  Reviewed  Nails  Reviewed     Diet  Order:   Diet Order            Diet regular Room service appropriate? Yes; Fluid consistency: Thin  Diet effective now             EDUCATION NEEDS:  No education needs have been identified at this time  Skin:  Skin Assessment: Reviewed RN Assessment  Last BM:  Unknown  Height:  Ht Readings from Last 1 Encounters:  01/01/18 5\' 1"  (1.549 m)   Weight:  Wt Readings from Last 1 Encounters:  01/01/18 51.2 kg   Wt Readings from Last 10 Encounters:  01/01/18 51.2 kg  12/03/17 51.3 kg  10/15/17 52.6 kg  09/27/17 52.6 kg  06/23/17 51.4 kg  03/08/16 62.1 kg  01/29/16 60.8 kg  01/20/16 61 kg  06/26/15 67.1 kg  06/08/15 67.3 kg   Ideal Body Weight:  47.73 kg  BMI:  Body mass index is 21.33 kg/m.  Estimated Nutritional Needs:  Kcal:  1450-1650 (28-32 kcal/kg bw) Protein:  67-77g Pro (1.3-1.5 g/kg bw) Fluid:  1.5-1.7 L fluid (65ml/kcal)  Burtis Junes RD, LDN, CNSC Clinical Nutrition Available Tues-Sat via Pager: [phone] 01/02/2018 2:26 PM

## 2018-01-02 NOTE — Evaluation (Signed)
Clinical/Bedside Swallow Evaluation Patient Details  Name: Tina Kline MRN: [id number] Date of Birth: 04-30-1929  Today's Date: 01/02/2018 Time: SLP Start Time (ACUTE ONLY): 53 SLP Stop Time (ACUTE ONLY): 1525 SLP Time Calculation (min) (ACUTE ONLY): 21 min  Past Medical History:  Past Medical History:  Diagnosis Date  . Anemia   . Dementia ([place])   . Hypertension   . Seizures ([place])    Past Surgical History:  Past Surgical History:  Procedure Laterality Date  . APPENDECTOMY    . BIOPSY  06/22/2017   Procedure: BIOPSY;  Surgeon: Daneil Dolin, MD;  [facility];  Service: Endoscopy;;  gastric   . COLONOSCOPY N/A 06/26/2015   Dr. Oneida Alar: Single tubular adenoma removed.  No future colonoscopies for surveillance purposes given age.  . ESOPHAGEAL DILATION N/A 06/22/2017   Procedure: ESOPHAGEAL DILATION;  Surgeon: Daneil Dolin, MD;  [facility];  Service: Endoscopy;  Laterality: N/A;  . ESOPHAGOGASTRODUODENOSCOPY N/A 01/29/2016   Dr. Oneida Alar: proximal esophageal web s/p dilation, large hiatal hernia, gastritis  . ESOPHAGOGASTRODUODENOSCOPY N/A 03/08/2016   Dr. Oneida Alar: proximal esophagel web s/p dilation, gastritis, moderate hiatal hernia  . ESOPHAGOGASTRODUODENOSCOPY  06/2015   Dr. Oneida Alar: Large hiatal hernia, gastritis, proximal esophageal web status post dilation  . ESOPHAGOGASTRODUODENOSCOPY (EGD) WITH PROPOFOL N/A 06/22/2017   Procedure: ESOPHAGOGASTRODUODENOSCOPY (EGD) WITH PROPOFOL;  Surgeon: Daneil Dolin, MD;  [facility];  Service: Endoscopy;  Laterality: N/A;  . MOUTH SURGERY    . SAVORY DILATION N/A 01/29/2016   Procedure: SAVORY DILATION;  Surgeon: Danie Binder, MD;  [facility];  Service: Endoscopy;  Laterality: N/A;  . SAVORY DILATION N/A 03/08/2016   Procedure: SAVORY DILATION;  Surgeon: Danie Binder, MD;  [facility];  Service: Endoscopy;  Laterality: N/A;   HPI:  Tina Kline is a 84 y.o.  female with medical history significant for dementia, chronic renal disease, who was the ED by EMS with reports of passing out earlier at about 11a.m. Patients daughter who is her care-giver- [name], got patient sit up in bed, then patient passed out,for a few seconds and resumed consciousness after she lay patient back down.  Family reports gradaully decline in functional status over the past weeks/months, with poor PO intake, increasing weakness, now requiring assistance with all ADLs. Patient has been taking only ensure ~3 times a day. Refusing water or any other meal. PCP is working on placement in long-term care.  At baseline, she ambulates with a walker, can recognize family members, answer simple questions most times.     Assessment / Plan / Recommendation Clinical Impression  Pt was seated upright in bed with daughters present (one daughter is full-time caregiver) at bedside for clinical swallowing evaluation. Family reports in the last few weeks Pt has stopped consuming all solid textures; she drinks 3 ensures daily and consumes some very soft textures including applesauce and mashed potatoes (puree). Pt with advanced dementia and suspect sensory aversion to solid textures as it can be associated with advanced dementia. Pt consumed thin liquids via straw and ice cream without overt s/sx of aspiraiton. Recommend initiate full liquid diet at this time with permissions for puree textures as requested by family/patient. There are no further ST needs noted at this time. ST to sign off. SLP Visit Diagnosis: Dysphagia, unspecified (R13.10)    Aspiration Risk  Mild aspiration risk    Diet Recommendation Thin liquid   Liquid Administration via: Cup;Straw Medication  Administration: Crushed with puree Supervision: Full supervision/cueing for compensatory strategies Compensations: Minimize environmental distractions;Slow rate;Small sips/bites Postural Changes: Seated upright at 90 degrees;Remain upright  for at least 30 minutes after po intake    Other  Recommendations Oral Care Recommendations: Oral care BID   Follow up Recommendations 24 hour supervision/assistance;Skilled Nursing facility             Prognosis Prognosis for Safe Diet Advancement: Guarded Barriers to Reach Goals: Cognitive deficits;Language deficits;Severity of deficits      Swallow Study   General Date of Onset: 01/01/18 HPI: Tina Kline is a 84 y.o. female with medical history significant for dementia, chronic renal disease, who was the ED by EMS with reports of passing out earlier at about 11a.m. Patients daughter who is her care-giver- [name], got patient sit up in bed, then patient passed out,for a few seconds and resumed consciousness after she lay patient back down.  Family reports gradaully decline in functional status over the past weeks/months, with poor PO intake, increasing weakness, now requiring assistance with all ADLs. Patient has been taking only ensure ~3 times a day. Refusing water or any other meal. PCP is working on placement in long-term care.  At baseline, she ambulates with a walker, can recognize family members, answer simple questions most times.   Type of Study: Bedside Swallow Evaluation Previous Swallow Assessment: none in chart Diet Prior to this Study: Thin liquids;Regular Temperature Spikes Noted: No Respiratory Status: Room air History of Recent Intubation: No Behavior/Cognition: Alert;Cooperative;Pleasant mood;Confused Oral Cavity Assessment: Within Functional Limits Oral Care Completed by SLP: Recent completion by staff Oral Cavity - Dentition: Dentures, top;Dentures, bottom Self-Feeding Abilities: Needs assist;Total assist Patient Positioning: Upright in bed;Postural control adequate for testing Baseline Vocal Quality: Normal Volitional Cough: Cognitively unable to elicit Volitional Swallow: Unable to elicit    Oral/Motor/Sensory Function Overall Oral Motor/Sensory  Function: Within functional limits   Ice Chips     Thin Liquid Thin Liquid: Within functional limits    Nectar Thick Nectar Thick Liquid: Not tested   Honey Thick Honey Thick Liquid: Not tested   Puree Puree: Within functional limits   Solid     Solid: Not tested     Kester Stimpson H. Roddie Mc, CCC-SLP Speech Language Pathologist  Wende Bushy 01/02/2018,3:36 PM

## 2018-01-02 NOTE — Care Management Obs Status (Signed)
[place] NOTIFICATION   Patient Details  Name: PEARLENA OW MRN: [id number] Date of Birth: [birth year]   Medicare Observation Status Notification Given:  Yes    Sherald Barge, RN 01/02/2018, 4:46 PM

## 2018-01-02 NOTE — Progress Notes (Signed)
EEG complete - results pending 

## 2018-01-02 NOTE — NC FL2 (Signed)
[place] MEDICAID FL2 LEVEL OF CARE SCREENING TOOL     IDENTIFICATION  Patient Name: Tina Kline Birthdate: 1929-09-22 Sex: female Admission Date (Current Location): 01/01/2018  [facility] and [place] Number:  [employer] and Address:  [facility] [address], [place]      Provider Number: [id number]  Attending Physician Name and Address:  Orson Eva, MD  Relative Name and Phone Number:       Current Level of Care: Other (Comment)(observation) Recommended Level of Care: [place] Prior Approval Number:    Date Approved/Denied:   PASRR Number: [id number]  Discharge Plan: SNF    Current Diagnoses: Patient Active Problem List   Diagnosis Date Noted  . Failure to thrive in adult 01/02/2018  . Acute renal failure superimposed on stage 3 chronic kidney disease ([place]) 01/02/2018  . Goals of care, counseling/discussion 01/02/2018  . Dehydration   . Hyponatremia   . Syncope and collapse   . Dementia ([place]) 01/01/2018  . H. pylori infection 09/27/2017  . Dark stools 09/27/2017  . Syncope 06/20/2017  . Anemia   . Dysphagia 01/20/2016  . IDA (iron deficiency anemia) 06/08/2015  . Chronic renal disease 06/08/2015  . Arthritis of knee, degenerative 06/13/2013    Orientation RESPIRATION BLADDER Height & Weight     Self  Normal Incontinent Weight: 112 lb 14 oz (51.2 kg) Height:  5\' 1"  (154.9 cm)  BEHAVIORAL SYMPTOMS/MOOD NEUROLOGICAL BOWEL NUTRITION STATUS      Incontinent (see discharge summary )  AMBULATORY STATUS COMMUNICATION OF NEEDS Skin   Limited Assist Verbally Normal                       Personal Care Assistance Level of Assistance  Bathing, Feeding, Dressing Bathing Assistance: Limited assistance Feeding assistance: Limited assistance Dressing Assistance: Limited assistance     Functional Limitations Info  Sight, Hearing, Speech Sight Info: Adequate Hearing Info: Adequate Speech Info:  Adequate    SPECIAL CARE FACTORS FREQUENCY  PT (By licensed PT)     PT Frequency: 5x/weel OT Frequency: n/a            Contractures Contractures Info: Not present    Additional Factors Info  Code Status, Allergies, Psychotropic Code Status Info: Full code Allergies Info: NKA Psychotropic Info: n/a         Current Medications (01/02/2018):  This is the current hospital active medication list Current Facility-Administered Medications  Medication Dose Route Frequency Provider Last Rate Last Dose  . 0.9 %  sodium chloride infusion   Intravenous Continuous Emokpae, Ejiroghene E, MD 100 mL/hr at 01/02/18 0513    . acetaminophen (TYLENOL) tablet 650 mg  650 mg Oral Q6H PRN Emokpae, Ejiroghene E, MD       Or  . acetaminophen (TYLENOL) suppository 650 mg  650 mg Rectal Q6H PRN Emokpae, Ejiroghene E, MD      . donepezil (ARICEPT) tablet 10 mg  10 mg Oral QHS Emokpae, Ejiroghene E, MD   10 mg at 01/01/18 2201  . feeding supplement (ENSURE ENLIVE) (ENSURE ENLIVE) liquid 237 mL  237 mL Oral TID WC Tat, David, MD      . ferrous sulfate tablet 325 mg  325 mg Oral BID WC Tat, Shanon Brow, MD   325 mg at 01/02/18 0907  . heparin injection 5,000 Units  5,000 Units Subcutaneous Q8H Emokpae, Ejiroghene E, MD   5,000 Units at 01/02/18 1356  . levETIRAcetam (KEPPRA) tablet  250 mg  250 mg Oral BID Emokpae, Ejiroghene E, MD   250 mg at 01/02/18 0906  . nystatin (MYCOSTATIN) 100000 UNIT/ML suspension 500,000 Units  5 mL Oral QID Emokpae, Ejiroghene E, MD   500,000 Units at 01/02/18 1356  . ondansetron (ZOFRAN) tablet 4 mg  4 mg Oral Q6H PRN Emokpae, Ejiroghene E, MD       Or  . ondansetron (ZOFRAN) injection 4 mg  4 mg Intravenous Q6H PRN Emokpae, Ejiroghene E, MD      . traZODone (DESYREL) tablet 25 mg  25 mg Oral QHS Emokpae, Ejiroghene E, MD   25 mg at 01/01/18 2333     Discharge Medications: Please see discharge summary for a list of discharge medications.  Relevant Imaging Results:  Relevant  Lab Results:   Additional Information SSN [id number], Clydene Pugh, LCSW

## 2018-01-02 NOTE — Evaluation (Signed)
Physical Therapy Evaluation Patient Details Name: Tina Kline MRN: [id number] DOB: 1929/09/01 Today's Date: 01/02/2018   History of Present Illness  Tina Kline is a 84 y.o. female with medical history significant for dementia, chronic renal disease, who was the ED by EMS with reports of passing out earlier at about 11a.m. Patients daughter who is her care-giver- [name], got patient sit up in bed, then patient passed out,for a few seconds and resumed consciousness after she lay patient back down.  Patient's daughter also reports some shaking of patient's left upper extremity only, with eyes rolled up into her head, but deny postictal symptoms of prolonged unconsciousness or snoring respirations.     Clinical Impression  Patient demonstrates slow labored movement with constant tactile/verbal cueing for sitting up at bedside, unable to take steps without assistance, required use of RW for safety, able to ambulate into hallway, limited secondary to c/o fatigue, frequent bumping into objects using RW requiring assistance to avoid falling. Patient tolerated sitting up in chair after therapy with family member present in room.  Patient will benefit from continued physical therapy in hospital and recommended venue below to increase strength, balance, endurance for safe ADLs and gait.    Follow Up Recommendations SNF;Supervision/Assistance - 24 hour;Supervision for mobility/OOB    Equipment Recommendations  None recommended by PT    Recommendations for Other Services       Precautions / Restrictions Precautions Precautions: Fall Restrictions Weight Bearing Restrictions: No      Mobility  Bed Mobility Overal bed mobility: Needs Assistance Bed Mobility: Supine to Sit     Supine to sit: Mod assist     General bed mobility comments: slow labored movement  Transfers Overall transfer level: Needs assistance Equipment used: Rolling walker (2 wheeled);1 person hand held  assist;None Transfers: Sit to/from [facility] Sit to Stand: Mod assist Stand pivot transfers: Mod assist       General transfer comment: very unsteady with reliance on hand held assist or leaning on nearby objects, requires verbal/tactile cueing to properly use RW  Ambulation/Gait Ambulation/Gait assistance: Mod assist Gait Distance (Feet): 30 Feet Assistive device: Rolling walker (2 wheeled) Gait Pattern/deviations: Decreased step length - right;Decreased step length - left;Decreased stride length Gait velocity: slow   General Gait Details: slow labored unsteady cadence, limited to 5-6 steps with hand held assist, increased endurance/safety using RW with constant verbal/tactile cueing for proper use with fair carryover, limited secondary to fatigue  Stairs            Wheelchair Mobility    Modified Rankin (Stroke Patients Only)       Balance Overall balance assessment: Needs assistance Sitting-balance support: Feet supported;No upper extremity supported Sitting balance-Leahy Scale: Fair     Standing balance support: Bilateral upper extremity supported;During functional activity Standing balance-Leahy Scale: Poor Standing balance comment: fair/poor using RW                             Pertinent Vitals/Pain Pain Assessment: No/denies pain    Home Living Family/patient expects to be discharged to:: Private residence Living Arrangements: Children Available Help at Discharge: Family Type of Home: House Home Access: Ramped entrance;Stairs to enter Entrance Stairs-Rails: Right;Left;Can reach both Entrance Stairs-Number of Steps: 4-5 steps(per family patient refuses to use ramp) Home Layout: One level Home Equipment: Environmental consultant - 2 wheels;Bedside commode      Prior Function Level of Independence: Needs assistance   Gait /  Transfers Assistance Needed: hand held assisted household ambulation, refuses to use RW most of time  ADL's /  Homemaking Assistance Needed: assisted by her daughter        Hand Dominance        Extremity/Trunk Assessment   Upper Extremity Assessment Upper Extremity Assessment: Generalized weakness    Lower Extremity Assessment Lower Extremity Assessment: Generalized weakness    Cervical / Trunk Assessment Cervical / Trunk Assessment: Kyphotic  Communication   Communication: No difficulties  Cognition Arousal/Alertness: Awake/alert Behavior During Therapy: WFL for tasks assessed/performed Overall Cognitive Status: History of cognitive impairments - at baseline                                        General Comments      Exercises     Assessment/Plan    PT Assessment Patient needs continued PT services  PT Problem List Decreased strength;Decreased activity tolerance;Decreased balance;Decreased mobility       PT Treatment Interventions Gait training;Stair training;Functional mobility training;Therapeutic activities;Therapeutic exercise;Patient/family education    PT Goals (Current goals can be found in the Care Plan section)  Acute Rehab PT Goals Patient Stated Goal: return home PT Goal Formulation: With patient/family Time For Goal Achievement: 01/16/18 Potential to Achieve Goals: Fair    Frequency Min 3X/week   Barriers to discharge        Co-evaluation               AM-PAC PT "6 Clicks" Mobility  Outcome Measure Help needed turning from your back to your side while in a flat bed without using bedrails?: Total Help needed moving from lying on your back to sitting on the side of a flat bed without using bedrails?: Total Help needed moving to and from a bed to a chair (including a wheelchair)?: Total Help needed standing up from a chair using your arms (e.g., wheelchair or bedside chair)?: A Lot Help needed to walk in hospital room?: A Lot Help needed climbing 3-5 steps with a railing? : A Lot 6 Click Score: 9    End of Session    Activity Tolerance: Patient tolerated treatment well;Patient limited by fatigue Patient left: in chair;with call bell/phone within reach;with family/visitor present Nurse Communication: Mobility status PT Visit Diagnosis: Unsteadiness on feet (R26.81);Other abnormalities of gait and mobility (R26.89);Muscle weakness (generalized) (M62.81)    Time: 1020-1047 PT Time Calculation (min) (ACUTE ONLY): 27 min   Charges:   PT Evaluation $PT Eval Moderate Complexity: 1 Mod PT Treatments $Therapeutic Activity: 23-37 mins        2:00 PM, 01/02/18 Lonell Grandchild, MPT Physical Therapist with [facility] [phone] office [phone] mobile phone

## 2018-01-02 NOTE — Progress Notes (Signed)
*  PRELIMINARY RESULTS* Echocardiogram 2D Echocardiogram has been performed.  Tina Kline 01/02/2018, 2:41 PM

## 2018-01-02 NOTE — Plan of Care (Signed)
  Problem: Acute Rehab PT Goals(only PT should resolve) Goal: Pt Will Go Supine/Side To Sit Outcome: Progressing Flowsheets (Taken 01/02/2018 1402) Pt will go Supine/Side to Sit: with minimal assist Goal: Patient Will Transfer Sit To/From Stand Outcome: Progressing Flowsheets (Taken 01/02/2018 1402) Patient will transfer sit to/from stand: with minimal assist Goal: Pt Will Transfer Bed To Chair/Chair To Bed Outcome: Progressing Flowsheets (Taken 01/02/2018 1402) Pt will Transfer Bed to Chair/Chair to Bed: with min assist Goal: Pt Will Ambulate Outcome: Progressing Flowsheets (Taken 01/02/2018 1402) Pt will Ambulate: with minimal assist; 50 feet; with rolling walker   2:02 PM, 01/02/18 Lonell Grandchild, MPT Physical Therapist with [facility] [phone] office [phone] mobile phone

## 2018-01-02 NOTE — Progress Notes (Addendum)
PROGRESS NOTE  Tina Kline ZOX:[id number] DOB: 27-Dec-1929 DOA: 01/01/2018 PCP: [name], MD  Brief History:  84 year old female with a history of dementia, CKD, iron deficiency anemia, and hypertension presenting after syncopal episode.  Because the patient's dementia, she is unable to provide history.  All his history is obtained from patient's daughter at the bedside and reviewed the medical record.  Apparently, the patient's daughter was assisting the patient in washing her up and on the side of the bed.  The patient subsequently lost consciousness for period of 1 to 2 minutes.  Her daughter described the patient's eyes " rolling into the back of her head".   there was some question whether the patient has not taken her letter extremity.  When the patient awoke, there was no postictal state.  The patient did not bite her tongue but there is no bladder or bowel incontinence.  According to the patient's daughter at the bedside, the patient has had intermittent syncopal episodes with similar presentation in the last 3 months.  Most of the episodes usually occur around the patient receiving some type of care for her ADLs.  In addition, the patient's family notes that the patient has had a decreased functional decline and decreased oral intake 5.  Weeks.  She has gone to the point where she is requires assistance with all her ADLs including making transfers and with ambulation.  There is no any fevers, chills, chest discomfort, shortness breath, nausea, vomiting, diarrhea, abdominal pain but there is no dysuria hematuria.  Upon presentation, the patient was afebrile hemodynamically stable.  Her saturation 100% room air.  BMP showed sodium 129 with serum creatinine 2.41.  CBC was essentially unremarkable with hemoglobin at baseline 9.1.  Troponin was negative x3.  Chest x-ray was negative for any acute findings.  CT brain was negative except for nonprogressive meningiomas.  Notably, the  patient was recently admitted to the hospital from 5 4019 through 06/23/2017.  During that time, the patient had a syncopal episode.  Neurology was consulted at that time.  MRI of the brain showed meningiomas.  The patient was placed on prophylactic Keppra.  Assessment/Plan: Syncope -Secondary to hypotension and vagal reaction -EMS noted the patient to have blood pressure 62/40 at the time of arrival -orthostatic vitals--positive -Echocardiogram -EEG -Monitor on telemetry -Personally reviewed EKG--sinus rhythm, no ST ST wave changes -Personally reviewed telemetry--no concerning dysrhythmia -check CK  Failure to thrive -Patient's family describes continued functional and cognitive decline consistent with the patient's natural history of dementia progression -They are interested in short-term rehab -Physical therapy eval -Serum W11 -Folic acid -TSH  Acute on chronic renal failure --CKD stage III -Secondary to volume depletion -Baseline creatinine 1.4-1.6 -presenting serum creatinine 2.41 -Continue IV fluids -A.m. CBC  Dementia without behavioral disturbance -Continue Aricept -Continue bedtime trazodone  Essential hypertension -Discontinue HCTZ, lisinopril, Aldactone, hydralazine -Monitor blood pressure off of antihypertensive medications -Current blood pressure remains acceptable  Iron deficiency anemia -Continue ferrous sulfate  Hyponatremia -Secondary to line depletion and poor solute intake -Continue normal saline IV  Goals of care -discussed with daughter--want FULL CODE for now -consult palliative    Disposition Plan:   SNF 1-2 days  Family Communication:   Daughter updated at bedside 11/25--Total time spent 35 minutes.  Greater than 50% spent face to face counseling and coordinating care.   Consultants:  none  Code Status:  FULL / DNR  DVT Prophylaxis:  [place] Heparin /  [place] Lovenox   Procedures: As Listed in Progress Note  Above  Antibiotics: None       Subjective: Patient denies fevers, chills, headache, chest pain, dyspnea, nausea, vomiting, diarrhea, abdominal pain, dysuria Objective: Vitals:   01/01/18 1930 01/01/18 2000 01/01/18 2100 01/02/18 0640  BP: (!) 121/54 (!) 104/49 (!) 118/54 (!) 97/47  Pulse: (!) 103 94 88 80  Resp: 14 12 20    Temp:   98.1 F (36.7 C) 97.7 F (36.5 C)  TempSrc:   Oral Oral  SpO2: 100% 100% 100% 100%  Weight:      Height:        Intake/Output Summary (Last 24 hours) at 01/02/2018 0745 Last data filed at 01/02/2018 0658 Gross per 24 hour  Intake 958.48 ml  Output 400 ml  Net 558.48 ml   Weight change:  Exam:   General:  Pt is alert, follows commands appropriately, not in acute distress  HEENT: No icterus, No thrush, No neck mass, Cut Off/AT  Cardiovascular: RRR, S1/S2, no rubs, no gallops  Respiratory: Poor respiratory effort.  Bibasilar crackles but no wheeze.  Abdomen: Soft/+BS, non tender, non distended, no guarding  Extremities: No edema, No lymphangitis, No petechiae, No rashes, no synovitis   Data Reviewed: I have personally reviewed following labs and imaging studies Basic Metabolic Panel: Recent Labs  Lab 01/01/18 1419 01/02/18 0401  NA 129* 133*  K 4.9 5.0  CL 97* 104  CO2 23 21*  GLUCOSE 133* 92  BUN 56* 51*  CREATININE 2.41* 2.02*  CALCIUM 10.0 9.3  MG  --  2.3   Liver Function Tests: No results for input(s): AST, ALT, ALKPHOS, BILITOT, PROT, ALBUMIN in the last 168 hours. No results for input(s): LIPASE, AMYLASE in the last 168 hours. No results for input(s): AMMONIA in the last 168 hours. Coagulation Profile: No results for input(s): INR, PROTIME in the last 168 hours. CBC: Recent Labs  Lab 01/01/18 1419  WBC 4.0  HGB 9.1*  HCT 28.7*  MCV 98.0  PLT 410*   Cardiac Enzymes: Recent Labs  Lab 01/01/18 1541 01/01/18 2222 01/02/18 0401  TROPONINI <0.03 <0.03 <0.03   BNP: Invalid input(s): POCBNP CBG: Recent  Labs  Lab 01/01/18 1525  GLUCAP 120*   HbA1C: No results for input(s): HGBA1C in the last 72 hours. Urine analysis:    Component Value Date/Time   COLORURINE YELLOW 01/01/2018 1404   APPEARANCEUR CLEAR 01/01/2018 1404   LABSPEC 1.010 01/01/2018 1404   PHURINE 5.0 01/01/2018 1404   GLUCOSEU NEGATIVE 01/01/2018 1404   HGBUR NEGATIVE 01/01/2018 1404   BILIRUBINUR NEGATIVE 01/01/2018 1404   KETONESUR NEGATIVE 01/01/2018 1404   PROTEINUR NEGATIVE 01/01/2018 1404   UROBILINOGEN 0.2 01/15/2013 0425   NITRITE NEGATIVE 01/01/2018 1404   LEUKOCYTESUR NEGATIVE 01/01/2018 1404   Sepsis Labs: @LABRCNTIP (procalcitonin:4,lacticidven:4) )No results found for this or any previous visit (from the past 240 hour(s)).   Scheduled Meds: . donepezil  10 mg Oral QHS  . heparin  5,000 Units Subcutaneous Q8H  . levETIRAcetam  250 mg Oral BID  . nystatin  5 mL Oral QID  . traZODone  25 mg Oral QHS   Continuous Infusions: . sodium chloride 100 mL/hr at 01/02/18 6812    Procedures/Studies: Dg Chest 1 View  Result Date: 01/01/2018 CLINICAL DATA:  Syncopal episodes at home this morning, hypertension, dementia EXAM: CHEST  1 VIEW COMPARISON:  06/21/2017 FINDINGS: Lordotic positioning. Normal heart size, mediastinal contours, and pulmonary vascularity. Probable small hiatal hernia. Atherosclerotic calcification aorta.  Lungs grossly clear. No infiltrate, pleural effusion or pneumothorax. Skin folds project over LEFT chest. Bones demineralized. IMPRESSION: Probable hiatal hernia. No acute abnormalities. Electronically Signed   By: Lavonia Dana M.D.   On: 01/01/2018 16:33   Ct Head Wo Contrast  Result Date: 01/01/2018 CLINICAL DATA:  Recurrent syncope EXAM: CT HEAD WITHOUT CONTRAST TECHNIQUE: Contiguous axial images were obtained from the base of the skull through the vertex without intravenous contrast. COMPARISON:  Brain MRI 06/21/2017 FINDINGS: Brain: No evidence of acute infarction, hemorrhage,  hydrocephalus, extra-axial collection or mass lesion/mass effect. Chronic small-vessel disease with confluent gliosis in the cerebral white matter. Generalized atrophy. Partially calcified meningioma along the left parietal convexity measuring 13 mm. Small hemisphere nodule measuring 5 mm, also stable and consistent with meningioma. Additional partially calcified probable meningioma along the right from convexity measuring 6 mm. Vascular: Atherosclerotic calcification Skull: Negative Sinuses/Orbits: Dysconjugate gaze, nonspecific IMPRESSION: 1. No acute finding. 2. Advanced chronic small vessel ischemia. 3. Small meningiomas without progression. Electronically Signed   By: Monte Fantasia M.D.   On: 01/01/2018 16:48    Orson Eva, DO  [facility] Pager [phone]  If 7PM-7AM, please contact night-coverage [email or web address] Password [email or web address] 01/02/2018, 7:45 AM   LOS: 0 days

## 2018-01-03 DIAGNOSIS — Z7189 Other specified counseling: Secondary | ICD-10-CM | POA: Diagnosis not present

## 2018-01-03 DIAGNOSIS — N183 Chronic kidney disease, stage 3 (moderate): Secondary | ICD-10-CM | POA: Diagnosis present

## 2018-01-03 DIAGNOSIS — R627 Adult failure to thrive: Secondary | ICD-10-CM | POA: Diagnosis present

## 2018-01-03 DIAGNOSIS — K449 Diaphragmatic hernia without obstruction or gangrene: Secondary | ICD-10-CM | POA: Diagnosis present

## 2018-01-03 DIAGNOSIS — Z803 Family history of malignant neoplasm of breast: Secondary | ICD-10-CM | POA: Diagnosis not present

## 2018-01-03 DIAGNOSIS — Z800 Family history of malignant neoplasm of digestive organs: Secondary | ICD-10-CM | POA: Diagnosis not present

## 2018-01-03 DIAGNOSIS — M6281 Muscle weakness (generalized): Secondary | ICD-10-CM | POA: Diagnosis not present

## 2018-01-03 DIAGNOSIS — E440 Moderate protein-calorie malnutrition: Secondary | ICD-10-CM | POA: Diagnosis not present

## 2018-01-03 DIAGNOSIS — Z6821 Body mass index (BMI) 21.0-21.9, adult: Secondary | ICD-10-CM | POA: Diagnosis not present

## 2018-01-03 DIAGNOSIS — D631 Anemia in chronic kidney disease: Secondary | ICD-10-CM | POA: Diagnosis present

## 2018-01-03 DIAGNOSIS — R262 Difficulty in walking, not elsewhere classified: Secondary | ICD-10-CM | POA: Diagnosis not present

## 2018-01-03 DIAGNOSIS — I129 Hypertensive chronic kidney disease with stage 1 through stage 4 chronic kidney disease, or unspecified chronic kidney disease: Secondary | ICD-10-CM | POA: Diagnosis present

## 2018-01-03 DIAGNOSIS — R55 Syncope and collapse: Secondary | ICD-10-CM | POA: Diagnosis not present

## 2018-01-03 DIAGNOSIS — E43 Unspecified severe protein-calorie malnutrition: Secondary | ICD-10-CM | POA: Diagnosis present

## 2018-01-03 DIAGNOSIS — Z743 Need for continuous supervision: Secondary | ICD-10-CM | POA: Diagnosis not present

## 2018-01-03 DIAGNOSIS — R1312 Dysphagia, oropharyngeal phase: Secondary | ICD-10-CM | POA: Diagnosis not present

## 2018-01-03 DIAGNOSIS — R404 Transient alteration of awareness: Secondary | ICD-10-CM | POA: Diagnosis not present

## 2018-01-03 DIAGNOSIS — R569 Unspecified convulsions: Secondary | ICD-10-CM | POA: Diagnosis not present

## 2018-01-03 DIAGNOSIS — I10 Essential (primary) hypertension: Secondary | ICD-10-CM | POA: Diagnosis not present

## 2018-01-03 DIAGNOSIS — D329 Benign neoplasm of meninges, unspecified: Secondary | ICD-10-CM | POA: Diagnosis present

## 2018-01-03 DIAGNOSIS — B370 Candidal stomatitis: Secondary | ICD-10-CM | POA: Diagnosis present

## 2018-01-03 DIAGNOSIS — Z79899 Other long term (current) drug therapy: Secondary | ICD-10-CM | POA: Diagnosis not present

## 2018-01-03 DIAGNOSIS — N179 Acute kidney failure, unspecified: Secondary | ICD-10-CM | POA: Diagnosis present

## 2018-01-03 DIAGNOSIS — D509 Iron deficiency anemia, unspecified: Secondary | ICD-10-CM | POA: Diagnosis not present

## 2018-01-03 DIAGNOSIS — F0390 Unspecified dementia without behavioral disturbance: Secondary | ICD-10-CM | POA: Diagnosis not present

## 2018-01-03 DIAGNOSIS — D508 Other iron deficiency anemias: Secondary | ICD-10-CM | POA: Diagnosis not present

## 2018-01-03 DIAGNOSIS — D649 Anemia, unspecified: Secondary | ICD-10-CM | POA: Diagnosis not present

## 2018-01-03 DIAGNOSIS — E860 Dehydration: Secondary | ICD-10-CM | POA: Diagnosis present

## 2018-01-03 DIAGNOSIS — I959 Hypotension, unspecified: Secondary | ICD-10-CM | POA: Diagnosis present

## 2018-01-03 DIAGNOSIS — E871 Hypo-osmolality and hyponatremia: Secondary | ICD-10-CM | POA: Diagnosis present

## 2018-01-03 LAB — BASIC METABOLIC PANEL
ANION GAP: 4 — AB (ref 5–15)
BUN: 41 mg/dL — ABNORMAL HIGH (ref 8–23)
CALCIUM: 8.8 mg/dL — AB (ref 8.9–10.3)
CO2: 20 mmol/L — ABNORMAL LOW (ref 22–32)
Chloride: 110 mmol/L (ref 98–111)
Creatinine, Ser: 1.76 mg/dL — ABNORMAL HIGH (ref 0.44–1.00)
GFR calc Af Amer: 30 mL/min — ABNORMAL LOW (ref >60)
GFR, EST NON AFRICAN AMERICAN: 26 mL/min — AB (ref >60)
Glucose, Bld: 93 mg/dL (ref 70–99)
Potassium: 5 mmol/L (ref 3.5–5.1)
SODIUM: 134 mmol/L — AB (ref 135–145)

## 2018-01-03 LAB — FOLATE RBC
FOLATE, RBC: 1412 ng/mL (ref >498)
Folate, Hemolysate: 316.3 ng/mL
HEMATOCRIT: 22.4 % — AB (ref 34.0–46.6)

## 2018-01-03 LAB — URINE CULTURE: CULTURE: NO GROWTH

## 2018-01-03 MED ORDER — NYSTATIN 100000 UNIT/ML MT SUSP: 5.0000 mL | Freq: Four times a day (QID) | OROMUCOSAL | Status: AC

## 2018-01-03 MED ORDER — TRAZODONE HCL 50 MG PO TABS: 25.0000 mg | ORAL_TABLET | Freq: Every day | ORAL | Status: DC

## 2018-01-03 NOTE — Progress Notes (Signed)
Pt transferred by EMS to [facility] center.

## 2018-01-03 NOTE — Clinical Social Work Placement (Signed)
   CLINICAL SOCIAL WORK PLACEMENT  NOTE  Date:  01/03/2018  Patient Details  Name: Tina Kline MRN: [id number] Date of Birth: December 19, 1929  Clinical Social Work is seeking post-discharge placement for this patient at the [place] level of care (*CSW will initial, date and re-position this form in  chart as items are completed):  Yes   Patient/family provided with [place] Work Department's list of facilities offering this level of care within the geographic area requested by the patient (or if unable, by the patient's family).  Yes   Patient/family informed of their freedom to choose among providers that offer the needed level of care, that participate in Medicare, Medicaid or managed care program needed by the patient, have an available bed and are willing to accept the patient.  Yes   Patient/family informed of [facility]'s ownership interest in [facility] and [facility], as well as of the fact that they are under no obligation to receive care at these facilities.  PASRR submitted to EDS on 01/02/18     PASRR number received on 01/02/18     Existing PASRR number confirmed on       FL2 transmitted to all facilities in geographic area requested by pt/family on 01/02/18     FL2 transmitted to all facilities within larger geographic area on       Patient informed that his/her managed care company has contracts with or will negotiate with certain facilities, including the following:        Yes   Patient/family informed of bed offers received.  Patient chooses bed at [facility]     Physician recommends and patient chooses bed at      Patient to be transferred to [facility] on 01/03/18.  Patient to be transferred to facility by [facility]     Patient family notified on 01/03/18 of transfer.  Name of family member notified:  Rayna Sexton, daughter     PHYSICIAN       Additional Comment:  Discharge clinicals sent.  LCSW signing off.  RN to call EMS.  _______________________________________________ Ambrose Pancoast D, LCSW 01/03/2018, 3:00 PM

## 2018-01-03 NOTE — Procedures (Signed)
History: 84 year old female being evaluated for syncope  Sedation: None  Technique: This is a 21 channel routine scalp EEG performed at the bedside with bipolar and monopolar montages arranged in accordance to the international 10/20 system of electrode placement. One channel was dedicated to EKG recording.    Background: The background consists of intermixed alpha and beta activities. There is a well defined posterior dominant rhythm of 8 Hz that attenuates with eye opening.  There is mild generalized irregular delta activity.  In addition there is a focal prominence of irregular delta activity in the frontotemporal regions.  Photic stimulation: Physiologic driving is not performed  EEG Abnormalities: 1) generalized irregular delta with a suggestion of a focal prominence in the left frontal temporal region  Clinical Interpretation: This EEG is consistent with a generalized nonspecific cerebral dysfunction (encephalopathy) with a suggestion of a focal nonspecific area of cerebral dysfunction in the left frontotemporal region.   There was no seizure or definite seizure predisposition recorded on this study. Please note that a normal EEG does not preclude the possibility of epilepsy.   Roland Rack, MD [facility] [phone]  If 7pm- 7am, please page neurology on call as listed in [place].

## 2018-01-03 NOTE — Progress Notes (Signed)
Patient is to be discharged to [facility] and in stable condition. Patient's IV and telemetry removed, WNL. Patient report called to [facility]. Patient to be transported via EMS.  Celestia Khat, RN

## 2018-01-03 NOTE — Discharge Summary (Signed)
Physician Discharge Summary  Tina Kline AOZ:[id number] DOB: 26-Mar-1929 DOA: 01/01/2018  PCP: Tina Fire, MD  Admit date: 01/01/2018 Discharge date: 01/03/2018  Time spent: 35 minutes  Recommendations for Outpatient Follow-up:  Repeat basic metabolic panel in 1 week to follow electrolytes and renal function. Reassess blood pressure and restart antihypertensive medication as needed.  Discharge Diagnoses:  Principal Problem:   Syncope Active Problems:   IDA (iron deficiency anemia)   Chronic renal disease   Dementia (HCC)   Failure to thrive in adult   Acute renal failure superimposed on stage 3 chronic kidney disease (HCC)   Dehydration   Hyponatremia   Syncope and collapse   Goals of care, counseling/discussion   Palliative care by specialist   Protein-calorie malnutrition, severe   Essential hypertension   Discharge Condition: Stable and improved.  Discharge to skilled nursing facility for further care and rehabilitation.  Diet recommendation: Full liquid diet  Filed Weights   01/01/18 1336  Weight: 51.2 kg    History of present illness:  Patient be written by Dr. Denton Kline on 01/01/2018. 84 y.o. female with medical history significant for dementia, chronic renal disease, who was the ED by EMS with reports of passing out earlier at about 11a.m. Patients daughter who is her care-giver- Tina Kline, got patient sit up in bed, then patient passed out,for a few seconds and resumed consciousness after she lay patient back down.  Patient's daughter also reports some shaking of patient's left upper extremity only, with eyes rolled up into her head, but deny postictal symptoms of prolonged unconsciousness or snoring respirations.  Family reports gradaully decline in functional status over the past weeks/months, with poor PO intake, increasing weakness, now requiring assistance with all ADLs. Patient has been taking only ensure ~3 times a day. Refusing water or any other meal. PCP  is working on placement in long-term care.  On EMS arrival, blood pressure- 62/40.  Patient to me denies chest pain, headache, SOB, no cough, no fevers or chills, No dysuria. At baseline, she ambulates with a walker, can recognize family members, answer simple questions most times.    ED Course: systolic down to 93, started on N/s infusion with improvement in blood pressure to 962X systolic.  UA clean.  Port chest x-ray negative for acute abnormality, probable hiatal hernia.  CT small meningiomas without progression, advanced chronic small vessel ischemia.  Sodium 129 chronically low, Cr  2.4 suggesting advancing CKD,, elevated BUN 56.  Chronic stable anemia 9.  Hospitalist to admit for syncope.  Hospital Course:  1-syncope -Appears to be associated with orthostatic vital signs in the setting of poor nutrition/hydration and continue use of antihypertensive agents. -No abnormalities appreciated on telemetry or EKG -Patient symptoms improve/resolve with fluid resuscitation. -No signs of active seizure on EEG and normal 2D echo. -Physical therapy has recommended skilled nursing facility (family in agreement to pursued short-term rehabilitation with eventual transition to long-term care). -Patient advised to keep herself well-hydrated and to prove nutrition. -antihypertensive agent has been discontinued at discharge.  2-failure to thrive/severe protein calorie malnutrition -Patient's family describes continued functional and cognitive decline consistent with patient's history of ongoing dementia. -Normal B28, folic acid TSH -Fluid resuscitation provided -Encouraged to maintain good hydration and oral intake -Patient seen by speech therapy who recommended full liquid diet initially with further follow-up by speech therapy at skilled nursing facility to further advance diet as appropriate. -Ensure TID for feeding supplements  3-acute on chronic renal failure -Patient at baseline with a stage  III  chronic kidney disease -Baseline creatinine 1.4--1.6 -On presentation creatinine was 2.41 -Nephrotoxic medication has been discontinued -Fluid resuscitation provided -At discharge creatinine 1.7. -Repeat basic metabolic panel in 1 week to follow electrolytes and renal function.  4-dementia without behavioral disturbances -Continue Aricept -Continue bedtime trazodone.  5-Essential hypertension -patient blood pressure has been observed without any medications for the last 48 hours no need of antihypertensive agents were appreciated. -We will continue holding blood pressure medications at discharge and reassess her vital signs to further determine medication needs in the near future.  6-iron deficiency anemia -Continue ferrous sulfate -Patient with anemia of chronic kidney disease -No signs of overt bleeding.  7-hyponatremia secondary to poor oral intake and electrolytes depletion by the use of diuretics. -Improved/resolved after fluid resuscitation -Repeat basic metabolic panel in 1 week to follow electrolytes trend  8-history of seizures -Continue Keppra -No seizure activity appreciated during hospitalization.  Procedures:  See below for x-ray reports.  2D echo: - Left ventricle: The cavity size was normal. Wall thickness was   increased in a pattern of mild LVH. Systolic function was normal.   The estimated ejection fraction was in the range of 60% to 65%. - Aortic valve: There was mild regurgitation. - Atrial septum: No defect or patent foramen ovale was identified.  Consultations:  None  Discharge Exam: Vitals:   01/02/18 2120 01/03/18 0551  BP: (!) 110/57 (!) 102/53  Pulse: 87 80  Resp: 20 18  Temp: 99.5 F (37.5 C) 98.6 F (37 C)  SpO2: 100% (!) 79%    General: afebrile, no CP, no nausea, no vomiting.  No lightheadedness. Patient is frail and underweight on exam.  Cardiovascular: S1 and S2, no rubs, no gallops, no JVD. Respiratory: Normal respiratory  effort, good air movement bilaterally, good oxygen saturation on room air.  No using accessory muscles. Abdomen: Soft, nontender, nondistended, positive bowel sounds. Extremities: No edema, no cyanosis, no clubbing.  Discharge Instructions   Discharge Instructions    Discharge instructions   Complete by:  As directed    Prior medications as prescribed Provide Good hydration and encourage patient on nutrition Full Liquid diet with supervision and assistance recommended; SPL to follow patient at SNF to further advance diet as tolerated.  Continue Palliative care follow up as an outpatient.   Increase activity slowly   Complete by:  As directed      Allergies as of 01/03/2018   No Known Allergies     Medication List    STOP taking these medications   hydrALAZINE 25 MG tablet Commonly known as:  APRESOLINE   hydrochlorothiazide 25 MG tablet Commonly known as:  HYDRODIURIL   lisinopril 40 MG tablet Commonly known as:  PRINIVIL,ZESTRIL   spironolactone 25 MG tablet Commonly known as:  ALDACTONE     TAKE these medications   acetaminophen 500 MG tablet Commonly known as:  TYLENOL Take 500 mg by mouth every 6 (six) hours as needed for mild pain or headache.   donepezil 10 MG tablet Commonly known as:  ARICEPT Take 10 mg by mouth at bedtime.   ferrous sulfate 325 (65 FE) MG EC tablet Take 1 tablet (325 mg total) by mouth 2 (two) times daily.   levETIRAcetam 250 MG tablet Commonly known as:  KEPPRA Take 1 tablet (250 mg total) by mouth 2 (two) times daily.   nystatin 100000 UNIT/ML suspension Commonly known as:  MYCOSTATIN Take 5 mLs (500,000 Units total) by mouth 4 (four) times daily for 5  days.   traZODone 50 MG tablet Commonly known as:  DESYREL Take 0.5 tablets (25 mg total) by mouth at bedtime.      No Known Allergies  Contact information for follow-up providers    Tina Fire, MD. Schedule an appointment as soon as possible for a visit in 2 week(s).    Specialty:  Internal Medicine Contact information: [place] [address] [phone]            Contact information for after-discharge care    [facility] .   Service:  Skilled Nursing Contact information: [address] [place] [phone]                  The results of significant diagnostics from this hospitalization (including imaging, microbiology, ancillary and laboratory) are listed below for reference.    Significant Diagnostic Studies: Dg Chest 1 View  Result Date: 01/01/2018 CLINICAL DATA:  Syncopal episodes at home this morning, hypertension, dementia EXAM: CHEST  1 VIEW COMPARISON:  06/21/2017 FINDINGS: Lordotic positioning. Normal heart size, mediastinal contours, and pulmonary vascularity. Probable small hiatal hernia. Atherosclerotic calcification aorta. Lungs grossly clear. No infiltrate, pleural effusion or pneumothorax. Skin folds project over LEFT chest. Bones demineralized. IMPRESSION: Probable hiatal hernia. No acute abnormalities. Electronically Signed   By: Lavonia Dana M.D.   On: 01/01/2018 16:33   Ct Head Wo Contrast  Result Date: 01/01/2018 CLINICAL DATA:  Recurrent syncope EXAM: CT HEAD WITHOUT CONTRAST TECHNIQUE: Contiguous axial images were obtained from the base of the skull through the vertex without intravenous contrast. COMPARISON:  Brain MRI 06/21/2017 FINDINGS: Brain: No evidence of acute infarction, hemorrhage, hydrocephalus, extra-axial collection or mass lesion/mass effect. Chronic small-vessel disease with confluent gliosis in the cerebral white matter. Generalized atrophy. Partially calcified meningioma along the left parietal convexity measuring 13 mm. Small hemisphere nodule measuring 5 mm, also stable and consistent with meningioma. Additional partially calcified probable meningioma along the right from convexity measuring 6 mm. Vascular:  Atherosclerotic calcification Skull: Negative Sinuses/Orbits: Dysconjugate gaze, nonspecific IMPRESSION: 1. No acute finding. 2. Advanced chronic small vessel ischemia. 3. Small meningiomas without progression. Electronically Signed   By: Monte Fantasia M.D.   On: 01/01/2018 16:48    Microbiology: Recent Results (from the past 240 hour(s))  Urine culture     Status: None   Collection Time: 01/01/18  3:33 PM  Result Value Ref Range Status   Specimen Description   Final    URINE, CATHETERIZED Performed at [facility], [address] [place], [place] [address]    Special Requests   Final    NONE Performed at [facility], [address], [place], [place] [address]    Culture   Final    NO GROWTH Performed at [facility], [place] [address], [place], [place] [address]    Report Status 01/03/2018 FINAL  Final     Labs: Basic Metabolic Panel: Recent Labs  Lab 01/01/18 1419 01/02/18 0401 01/03/18 0526  NA 129* 133* 134*  K 4.9 5.0 5.0  CL 97* 104 110  CO2 23 21* 20*  GLUCOSE 133* 92 93  BUN 56* 51* 41*  CREATININE 2.41* 2.02* 1.76*  CALCIUM 10.0 9.3 8.8*  MG  --  2.3  --    CBC: Recent Labs  Lab 01/01/18 1419  WBC 4.0  HGB 9.1*  HCT 28.7*  MCV 98.0  PLT 410*   Cardiac Enzymes: Recent  Labs  Lab 01/01/18 1541 01/01/18 2222 01/02/18 0401 01/02/18 0935  CKTOTAL  --   --   --  249*  TROPONINI <0.03 <0.03 <0.03  --    CBG: Recent Labs  Lab 01/01/18 1525  GLUCAP 120*    Signed:  Barton Dubois MD.  [facility] 01/03/2018, 2:25 PM

## 2018-01-08 DIAGNOSIS — R55 Syncope and collapse: Secondary | ICD-10-CM | POA: Diagnosis not present

## 2018-01-08 DIAGNOSIS — F0390 Unspecified dementia without behavioral disturbance: Secondary | ICD-10-CM | POA: Diagnosis not present

## 2018-01-08 DIAGNOSIS — N179 Acute kidney failure, unspecified: Secondary | ICD-10-CM | POA: Diagnosis not present

## 2018-01-08 DIAGNOSIS — D508 Other iron deficiency anemias: Secondary | ICD-10-CM | POA: Diagnosis not present

## 2018-01-11 ENCOUNTER — Other Ambulatory Visit: Payer: Self-pay | Admitting: *Deleted

## 2018-01-11 NOTE — Patient Outreach (Signed)
[facility]) Care Management  01/11/2018  AMYJO MIZRACHI 1929-10-20 [id number]   Spoke with Rachel Moulds, [place] at [facility]. She reports that patient family plan to transfer patient to another facility and that they plan for her to transition to LTC.  No plans to discharge home. No [facility] Care management needs assessed. Plan to sign off. Royetta Crochet. Laymond Purser, RN, BSN, [place] [phone]) Business Cell  [phone]) Toll Free Office

## 2018-01-13 DIAGNOSIS — N183 Chronic kidney disease, stage 3 (moderate): Secondary | ICD-10-CM | POA: Diagnosis not present

## 2018-01-13 DIAGNOSIS — F0390 Unspecified dementia without behavioral disturbance: Secondary | ICD-10-CM | POA: Diagnosis not present

## 2018-01-13 DIAGNOSIS — R569 Unspecified convulsions: Secondary | ICD-10-CM | POA: Diagnosis not present

## 2018-01-13 DIAGNOSIS — D649 Anemia, unspecified: Secondary | ICD-10-CM | POA: Diagnosis not present

## 2018-01-15 DIAGNOSIS — R55 Syncope and collapse: Secondary | ICD-10-CM | POA: Diagnosis not present

## 2018-01-22 DIAGNOSIS — R55 Syncope and collapse: Secondary | ICD-10-CM | POA: Diagnosis not present

## 2018-01-22 DIAGNOSIS — R609 Edema, unspecified: Secondary | ICD-10-CM | POA: Diagnosis not present

## 2018-02-05 DIAGNOSIS — N183 Chronic kidney disease, stage 3 (moderate): Secondary | ICD-10-CM | POA: Diagnosis not present

## 2018-02-05 DIAGNOSIS — R569 Unspecified convulsions: Secondary | ICD-10-CM | POA: Diagnosis not present

## 2018-02-05 DIAGNOSIS — D508 Other iron deficiency anemias: Secondary | ICD-10-CM | POA: Diagnosis not present

## 2018-02-05 DIAGNOSIS — F0390 Unspecified dementia without behavioral disturbance: Secondary | ICD-10-CM | POA: Diagnosis not present

## 2018-02-26 DIAGNOSIS — N183 Chronic kidney disease, stage 3 (moderate): Secondary | ICD-10-CM | POA: Diagnosis not present

## 2018-02-26 DIAGNOSIS — D649 Anemia, unspecified: Secondary | ICD-10-CM | POA: Diagnosis not present

## 2018-02-26 DIAGNOSIS — R569 Unspecified convulsions: Secondary | ICD-10-CM | POA: Diagnosis not present

## 2018-02-26 DIAGNOSIS — R609 Edema, unspecified: Secondary | ICD-10-CM | POA: Diagnosis not present

## 2018-03-05 DIAGNOSIS — I10 Essential (primary) hypertension: Secondary | ICD-10-CM | POA: Diagnosis not present

## 2018-03-05 DIAGNOSIS — D649 Anemia, unspecified: Secondary | ICD-10-CM | POA: Diagnosis not present

## 2018-03-07 DIAGNOSIS — M79675 Pain in left toe(s): Secondary | ICD-10-CM | POA: Diagnosis not present

## 2018-03-07 DIAGNOSIS — I739 Peripheral vascular disease, unspecified: Secondary | ICD-10-CM | POA: Diagnosis not present

## 2018-03-07 DIAGNOSIS — M2012 Hallux valgus (acquired), left foot: Secondary | ICD-10-CM | POA: Diagnosis not present

## 2018-03-07 DIAGNOSIS — B351 Tinea unguium: Secondary | ICD-10-CM | POA: Diagnosis not present

## 2018-03-23 DIAGNOSIS — D519 Vitamin B12 deficiency anemia, unspecified: Secondary | ICD-10-CM | POA: Diagnosis not present

## 2018-03-23 DIAGNOSIS — I10 Essential (primary) hypertension: Secondary | ICD-10-CM | POA: Diagnosis not present

## 2018-03-23 DIAGNOSIS — D649 Anemia, unspecified: Secondary | ICD-10-CM | POA: Diagnosis not present

## 2018-03-27 DIAGNOSIS — G40909 Epilepsy, unspecified, not intractable, without status epilepticus: Secondary | ICD-10-CM | POA: Diagnosis not present

## 2018-03-27 DIAGNOSIS — R55 Syncope and collapse: Secondary | ICD-10-CM | POA: Diagnosis not present

## 2018-03-27 DIAGNOSIS — D649 Anemia, unspecified: Secondary | ICD-10-CM | POA: Diagnosis not present

## 2018-03-27 DIAGNOSIS — F0390 Unspecified dementia without behavioral disturbance: Secondary | ICD-10-CM | POA: Diagnosis not present

## 2018-03-27 DIAGNOSIS — M6281 Muscle weakness (generalized): Secondary | ICD-10-CM | POA: Diagnosis not present

## 2018-03-27 DIAGNOSIS — I129 Hypertensive chronic kidney disease with stage 1 through stage 4 chronic kidney disease, or unspecified chronic kidney disease: Secondary | ICD-10-CM | POA: Diagnosis not present

## 2018-03-27 DIAGNOSIS — R41841 Cognitive communication deficit: Secondary | ICD-10-CM | POA: Diagnosis not present

## 2018-03-27 DIAGNOSIS — R1312 Dysphagia, oropharyngeal phase: Secondary | ICD-10-CM | POA: Diagnosis not present

## 2018-03-28 DIAGNOSIS — R1312 Dysphagia, oropharyngeal phase: Secondary | ICD-10-CM | POA: Diagnosis not present

## 2018-03-28 DIAGNOSIS — F0390 Unspecified dementia without behavioral disturbance: Secondary | ICD-10-CM | POA: Diagnosis not present

## 2018-03-28 DIAGNOSIS — R41841 Cognitive communication deficit: Secondary | ICD-10-CM | POA: Diagnosis not present

## 2018-03-28 DIAGNOSIS — R55 Syncope and collapse: Secondary | ICD-10-CM | POA: Diagnosis not present

## 2018-03-28 DIAGNOSIS — M6281 Muscle weakness (generalized): Secondary | ICD-10-CM | POA: Diagnosis not present

## 2018-03-29 DIAGNOSIS — M6281 Muscle weakness (generalized): Secondary | ICD-10-CM | POA: Diagnosis not present

## 2018-03-29 DIAGNOSIS — R55 Syncope and collapse: Secondary | ICD-10-CM | POA: Diagnosis not present

## 2018-03-29 DIAGNOSIS — R1312 Dysphagia, oropharyngeal phase: Secondary | ICD-10-CM | POA: Diagnosis not present

## 2018-03-29 DIAGNOSIS — F0390 Unspecified dementia without behavioral disturbance: Secondary | ICD-10-CM | POA: Diagnosis not present

## 2018-03-29 DIAGNOSIS — R41841 Cognitive communication deficit: Secondary | ICD-10-CM | POA: Diagnosis not present

## 2018-03-30 DIAGNOSIS — R41841 Cognitive communication deficit: Secondary | ICD-10-CM | POA: Diagnosis not present

## 2018-03-30 DIAGNOSIS — F0390 Unspecified dementia without behavioral disturbance: Secondary | ICD-10-CM | POA: Diagnosis not present

## 2018-03-30 DIAGNOSIS — M6281 Muscle weakness (generalized): Secondary | ICD-10-CM | POA: Diagnosis not present

## 2018-03-30 DIAGNOSIS — R1312 Dysphagia, oropharyngeal phase: Secondary | ICD-10-CM | POA: Diagnosis not present

## 2018-03-30 DIAGNOSIS — R55 Syncope and collapse: Secondary | ICD-10-CM | POA: Diagnosis not present

## 2018-03-31 DIAGNOSIS — E860 Dehydration: Secondary | ICD-10-CM | POA: Diagnosis not present

## 2018-03-31 DIAGNOSIS — D519 Vitamin B12 deficiency anemia, unspecified: Secondary | ICD-10-CM | POA: Diagnosis not present

## 2018-03-31 DIAGNOSIS — D649 Anemia, unspecified: Secondary | ICD-10-CM | POA: Diagnosis not present

## 2018-03-31 DIAGNOSIS — R7989 Other specified abnormal findings of blood chemistry: Secondary | ICD-10-CM | POA: Diagnosis not present

## 2018-03-31 DIAGNOSIS — D509 Iron deficiency anemia, unspecified: Secondary | ICD-10-CM | POA: Diagnosis not present

## 2018-04-02 DIAGNOSIS — R1312 Dysphagia, oropharyngeal phase: Secondary | ICD-10-CM | POA: Diagnosis not present

## 2018-04-02 DIAGNOSIS — M6281 Muscle weakness (generalized): Secondary | ICD-10-CM | POA: Diagnosis not present

## 2018-04-02 DIAGNOSIS — R41841 Cognitive communication deficit: Secondary | ICD-10-CM | POA: Diagnosis not present

## 2018-04-02 DIAGNOSIS — F0390 Unspecified dementia without behavioral disturbance: Secondary | ICD-10-CM | POA: Diagnosis not present

## 2018-04-02 DIAGNOSIS — R55 Syncope and collapse: Secondary | ICD-10-CM | POA: Diagnosis not present

## 2018-04-03 DIAGNOSIS — I129 Hypertensive chronic kidney disease with stage 1 through stage 4 chronic kidney disease, or unspecified chronic kidney disease: Secondary | ICD-10-CM | POA: Diagnosis not present

## 2018-04-03 DIAGNOSIS — M6281 Muscle weakness (generalized): Secondary | ICD-10-CM | POA: Diagnosis not present

## 2018-04-03 DIAGNOSIS — R1312 Dysphagia, oropharyngeal phase: Secondary | ICD-10-CM | POA: Diagnosis not present

## 2018-04-03 DIAGNOSIS — N183 Chronic kidney disease, stage 3 (moderate): Secondary | ICD-10-CM | POA: Diagnosis not present

## 2018-04-03 DIAGNOSIS — R41841 Cognitive communication deficit: Secondary | ICD-10-CM | POA: Diagnosis not present

## 2018-04-03 DIAGNOSIS — R55 Syncope and collapse: Secondary | ICD-10-CM | POA: Diagnosis not present

## 2018-04-03 DIAGNOSIS — D649 Anemia, unspecified: Secondary | ICD-10-CM | POA: Diagnosis not present

## 2018-04-03 DIAGNOSIS — F0390 Unspecified dementia without behavioral disturbance: Secondary | ICD-10-CM | POA: Diagnosis not present

## 2018-04-04 DIAGNOSIS — R41841 Cognitive communication deficit: Secondary | ICD-10-CM | POA: Diagnosis not present

## 2018-04-04 DIAGNOSIS — G40909 Epilepsy, unspecified, not intractable, without status epilepticus: Secondary | ICD-10-CM | POA: Diagnosis not present

## 2018-04-04 DIAGNOSIS — M6281 Muscle weakness (generalized): Secondary | ICD-10-CM | POA: Diagnosis not present

## 2018-04-04 DIAGNOSIS — F0390 Unspecified dementia without behavioral disturbance: Secondary | ICD-10-CM | POA: Diagnosis not present

## 2018-04-04 DIAGNOSIS — R55 Syncope and collapse: Secondary | ICD-10-CM | POA: Diagnosis not present

## 2018-04-04 DIAGNOSIS — I129 Hypertensive chronic kidney disease with stage 1 through stage 4 chronic kidney disease, or unspecified chronic kidney disease: Secondary | ICD-10-CM | POA: Diagnosis not present

## 2018-04-04 DIAGNOSIS — R1312 Dysphagia, oropharyngeal phase: Secondary | ICD-10-CM | POA: Diagnosis not present

## 2018-04-04 DIAGNOSIS — D649 Anemia, unspecified: Secondary | ICD-10-CM | POA: Diagnosis not present

## 2018-04-05 DIAGNOSIS — M6281 Muscle weakness (generalized): Secondary | ICD-10-CM | POA: Diagnosis not present

## 2018-04-05 DIAGNOSIS — F0390 Unspecified dementia without behavioral disturbance: Secondary | ICD-10-CM | POA: Diagnosis not present

## 2018-04-05 DIAGNOSIS — R41841 Cognitive communication deficit: Secondary | ICD-10-CM | POA: Diagnosis not present

## 2018-04-05 DIAGNOSIS — R1312 Dysphagia, oropharyngeal phase: Secondary | ICD-10-CM | POA: Diagnosis not present

## 2018-04-05 DIAGNOSIS — R55 Syncope and collapse: Secondary | ICD-10-CM | POA: Diagnosis not present

## 2018-04-06 DIAGNOSIS — F0390 Unspecified dementia without behavioral disturbance: Secondary | ICD-10-CM | POA: Diagnosis not present

## 2018-04-06 DIAGNOSIS — M6281 Muscle weakness (generalized): Secondary | ICD-10-CM | POA: Diagnosis not present

## 2018-04-06 DIAGNOSIS — R55 Syncope and collapse: Secondary | ICD-10-CM | POA: Diagnosis not present

## 2018-04-06 DIAGNOSIS — R41841 Cognitive communication deficit: Secondary | ICD-10-CM | POA: Diagnosis not present

## 2018-04-06 DIAGNOSIS — R1312 Dysphagia, oropharyngeal phase: Secondary | ICD-10-CM | POA: Diagnosis not present

## 2018-04-08 DIAGNOSIS — M6281 Muscle weakness (generalized): Secondary | ICD-10-CM | POA: Diagnosis not present

## 2018-04-08 DIAGNOSIS — G40909 Epilepsy, unspecified, not intractable, without status epilepticus: Secondary | ICD-10-CM | POA: Diagnosis not present

## 2018-04-08 DIAGNOSIS — R55 Syncope and collapse: Secondary | ICD-10-CM | POA: Diagnosis not present

## 2018-04-08 DIAGNOSIS — I129 Hypertensive chronic kidney disease with stage 1 through stage 4 chronic kidney disease, or unspecified chronic kidney disease: Secondary | ICD-10-CM | POA: Diagnosis not present

## 2018-04-08 DIAGNOSIS — R41841 Cognitive communication deficit: Secondary | ICD-10-CM | POA: Diagnosis not present

## 2018-04-08 DIAGNOSIS — R1312 Dysphagia, oropharyngeal phase: Secondary | ICD-10-CM | POA: Diagnosis not present

## 2018-04-08 DIAGNOSIS — F0390 Unspecified dementia without behavioral disturbance: Secondary | ICD-10-CM | POA: Diagnosis not present

## 2018-04-09 DIAGNOSIS — R1312 Dysphagia, oropharyngeal phase: Secondary | ICD-10-CM | POA: Diagnosis not present

## 2018-04-09 DIAGNOSIS — M6281 Muscle weakness (generalized): Secondary | ICD-10-CM | POA: Diagnosis not present

## 2018-04-09 DIAGNOSIS — R55 Syncope and collapse: Secondary | ICD-10-CM | POA: Diagnosis not present

## 2018-04-09 DIAGNOSIS — F0390 Unspecified dementia without behavioral disturbance: Secondary | ICD-10-CM | POA: Diagnosis not present

## 2018-04-09 DIAGNOSIS — R41841 Cognitive communication deficit: Secondary | ICD-10-CM | POA: Diagnosis not present

## 2018-04-10 DIAGNOSIS — M6281 Muscle weakness (generalized): Secondary | ICD-10-CM | POA: Diagnosis not present

## 2018-04-10 DIAGNOSIS — R1312 Dysphagia, oropharyngeal phase: Secondary | ICD-10-CM | POA: Diagnosis not present

## 2018-04-10 DIAGNOSIS — R41841 Cognitive communication deficit: Secondary | ICD-10-CM | POA: Diagnosis not present

## 2018-04-10 DIAGNOSIS — F0390 Unspecified dementia without behavioral disturbance: Secondary | ICD-10-CM | POA: Diagnosis not present

## 2018-04-10 DIAGNOSIS — R55 Syncope and collapse: Secondary | ICD-10-CM | POA: Diagnosis not present

## 2018-04-11 DIAGNOSIS — F0390 Unspecified dementia without behavioral disturbance: Secondary | ICD-10-CM | POA: Diagnosis not present

## 2018-04-11 DIAGNOSIS — M6281 Muscle weakness (generalized): Secondary | ICD-10-CM | POA: Diagnosis not present

## 2018-04-11 DIAGNOSIS — R1312 Dysphagia, oropharyngeal phase: Secondary | ICD-10-CM | POA: Diagnosis not present

## 2018-04-11 DIAGNOSIS — R55 Syncope and collapse: Secondary | ICD-10-CM | POA: Diagnosis not present

## 2018-04-11 DIAGNOSIS — R41841 Cognitive communication deficit: Secondary | ICD-10-CM | POA: Diagnosis not present

## 2018-04-12 DIAGNOSIS — R1312 Dysphagia, oropharyngeal phase: Secondary | ICD-10-CM | POA: Diagnosis not present

## 2018-04-12 DIAGNOSIS — R41841 Cognitive communication deficit: Secondary | ICD-10-CM | POA: Diagnosis not present

## 2018-04-12 DIAGNOSIS — F0390 Unspecified dementia without behavioral disturbance: Secondary | ICD-10-CM | POA: Diagnosis not present

## 2018-04-12 DIAGNOSIS — M6281 Muscle weakness (generalized): Secondary | ICD-10-CM | POA: Diagnosis not present

## 2018-04-12 DIAGNOSIS — R55 Syncope and collapse: Secondary | ICD-10-CM | POA: Diagnosis not present

## 2018-04-13 ENCOUNTER — Encounter (HOSPITAL_COMMUNITY): Payer: Self-pay | Admitting: Emergency Medicine

## 2018-04-13 ENCOUNTER — Inpatient Hospital Stay (HOSPITAL_COMMUNITY)
Admission: EM | Admit: 2018-04-13 | Discharge: 2018-04-16 | DRG: 481 | Disposition: A | Discharge status: Skilled Nursing Facility | Payer: Medicare Other | Attending: Internal Medicine | Admitting: Internal Medicine

## 2018-04-13 ENCOUNTER — Emergency Department (HOSPITAL_COMMUNITY): Payer: Medicare Other

## 2018-04-13 ENCOUNTER — Other Ambulatory Visit: Payer: Self-pay

## 2018-04-13 DIAGNOSIS — Z4789 Encounter for other orthopedic aftercare: Secondary | ICD-10-CM | POA: Diagnosis not present

## 2018-04-13 DIAGNOSIS — R5381 Other malaise: Secondary | ICD-10-CM | POA: Diagnosis not present

## 2018-04-13 DIAGNOSIS — S72009A Fracture of unspecified part of neck of unspecified femur, initial encounter for closed fracture: Secondary | ICD-10-CM

## 2018-04-13 DIAGNOSIS — I10 Essential (primary) hypertension: Secondary | ICD-10-CM | POA: Diagnosis present

## 2018-04-13 DIAGNOSIS — N183 Chronic kidney disease, stage 3 (moderate): Secondary | ICD-10-CM | POA: Diagnosis present

## 2018-04-13 DIAGNOSIS — Y92129 Unspecified place in nursing home as the place of occurrence of the external cause: Secondary | ICD-10-CM | POA: Diagnosis not present

## 2018-04-13 DIAGNOSIS — S299XXA Unspecified injury of thorax, initial encounter: Secondary | ICD-10-CM | POA: Diagnosis not present

## 2018-04-13 DIAGNOSIS — Z7401 Bed confinement status: Secondary | ICD-10-CM | POA: Diagnosis not present

## 2018-04-13 DIAGNOSIS — G4700 Insomnia, unspecified: Secondary | ICD-10-CM | POA: Diagnosis not present

## 2018-04-13 DIAGNOSIS — G40909 Epilepsy, unspecified, not intractable, without status epilepticus: Secondary | ICD-10-CM | POA: Diagnosis present

## 2018-04-13 DIAGNOSIS — F0390 Unspecified dementia without behavioral disturbance: Secondary | ICD-10-CM | POA: Diagnosis present

## 2018-04-13 DIAGNOSIS — D500 Iron deficiency anemia secondary to blood loss (chronic): Secondary | ICD-10-CM | POA: Diagnosis present

## 2018-04-13 DIAGNOSIS — S72101A Unspecified trochanteric fracture of right femur, initial encounter for closed fracture: Secondary | ICD-10-CM | POA: Diagnosis not present

## 2018-04-13 DIAGNOSIS — S72141A Displaced intertrochanteric fracture of right femur, initial encounter for closed fracture: Secondary | ICD-10-CM | POA: Diagnosis not present

## 2018-04-13 DIAGNOSIS — R627 Adult failure to thrive: Secondary | ICD-10-CM | POA: Diagnosis present

## 2018-04-13 DIAGNOSIS — S72001D Fracture of unspecified part of neck of right femur, subsequent encounter for closed fracture with routine healing: Secondary | ICD-10-CM | POA: Diagnosis not present

## 2018-04-13 DIAGNOSIS — R262 Difficulty in walking, not elsewhere classified: Secondary | ICD-10-CM | POA: Diagnosis present

## 2018-04-13 DIAGNOSIS — R296 Repeated falls: Secondary | ICD-10-CM | POA: Diagnosis present

## 2018-04-13 DIAGNOSIS — R2689 Other abnormalities of gait and mobility: Secondary | ICD-10-CM | POA: Diagnosis present

## 2018-04-13 DIAGNOSIS — R55 Syncope and collapse: Secondary | ICD-10-CM | POA: Diagnosis present

## 2018-04-13 DIAGNOSIS — E43 Unspecified severe protein-calorie malnutrition: Secondary | ICD-10-CM | POA: Diagnosis present

## 2018-04-13 DIAGNOSIS — Z800 Family history of malignant neoplasm of digestive organs: Secondary | ICD-10-CM | POA: Diagnosis not present

## 2018-04-13 DIAGNOSIS — S72001A Fracture of unspecified part of neck of right femur, initial encounter for closed fracture: Secondary | ICD-10-CM | POA: Diagnosis not present

## 2018-04-13 DIAGNOSIS — Z803 Family history of malignant neoplasm of breast: Secondary | ICD-10-CM

## 2018-04-13 DIAGNOSIS — E860 Dehydration: Secondary | ICD-10-CM | POA: Diagnosis present

## 2018-04-13 DIAGNOSIS — I129 Hypertensive chronic kidney disease with stage 1 through stage 4 chronic kidney disease, or unspecified chronic kidney disease: Secondary | ICD-10-CM | POA: Diagnosis present

## 2018-04-13 DIAGNOSIS — D649 Anemia, unspecified: Secondary | ICD-10-CM | POA: Diagnosis not present

## 2018-04-13 DIAGNOSIS — M1711 Unilateral primary osteoarthritis, right knee: Secondary | ICD-10-CM | POA: Diagnosis not present

## 2018-04-13 DIAGNOSIS — S098XXA Other specified injuries of head, initial encounter: Secondary | ICD-10-CM | POA: Diagnosis not present

## 2018-04-13 DIAGNOSIS — S7291XA Unspecified fracture of right femur, initial encounter for closed fracture: Secondary | ICD-10-CM | POA: Diagnosis present

## 2018-04-13 DIAGNOSIS — S72041A Displaced fracture of base of neck of right femur, initial encounter for closed fracture: Secondary | ICD-10-CM | POA: Diagnosis not present

## 2018-04-13 DIAGNOSIS — D509 Iron deficiency anemia, unspecified: Secondary | ICD-10-CM | POA: Diagnosis present

## 2018-04-13 DIAGNOSIS — N179 Acute kidney failure, unspecified: Secondary | ICD-10-CM | POA: Diagnosis present

## 2018-04-13 DIAGNOSIS — D62 Acute posthemorrhagic anemia: Secondary | ICD-10-CM | POA: Diagnosis not present

## 2018-04-13 DIAGNOSIS — R569 Unspecified convulsions: Secondary | ICD-10-CM | POA: Diagnosis present

## 2018-04-13 DIAGNOSIS — R609 Edema, unspecified: Secondary | ICD-10-CM | POA: Diagnosis present

## 2018-04-13 DIAGNOSIS — M25461 Effusion, right knee: Secondary | ICD-10-CM | POA: Diagnosis not present

## 2018-04-13 DIAGNOSIS — R1312 Dysphagia, oropharyngeal phase: Secondary | ICD-10-CM | POA: Diagnosis present

## 2018-04-13 DIAGNOSIS — Z23 Encounter for immunization: Secondary | ICD-10-CM | POA: Diagnosis not present

## 2018-04-13 DIAGNOSIS — R404 Transient alteration of awareness: Secondary | ICD-10-CM | POA: Diagnosis not present

## 2018-04-13 DIAGNOSIS — W19XXXA Unspecified fall, initial encounter: Secondary | ICD-10-CM | POA: Diagnosis not present

## 2018-04-13 DIAGNOSIS — R41841 Cognitive communication deficit: Secondary | ICD-10-CM | POA: Diagnosis present

## 2018-04-13 DIAGNOSIS — E871 Hypo-osmolality and hyponatremia: Secondary | ICD-10-CM | POA: Diagnosis present

## 2018-04-13 DIAGNOSIS — D631 Anemia in chronic kidney disease: Secondary | ICD-10-CM | POA: Diagnosis present

## 2018-04-13 DIAGNOSIS — S79929A Unspecified injury of unspecified thigh, initial encounter: Secondary | ICD-10-CM | POA: Diagnosis not present

## 2018-04-13 DIAGNOSIS — M6281 Muscle weakness (generalized): Secondary | ICD-10-CM | POA: Diagnosis present

## 2018-04-13 DIAGNOSIS — M25551 Pain in right hip: Secondary | ICD-10-CM | POA: Diagnosis not present

## 2018-04-13 LAB — COMPREHENSIVE METABOLIC PANEL
ALK PHOS: 157 U/L — AB (ref 38–126)
ALT: 47 U/L — AB (ref 0–44)
ANION GAP: 9 (ref 5–15)
AST: 29 U/L (ref 15–41)
Albumin: 3.9 g/dL (ref 3.5–5.0)
BILIRUBIN TOTAL: 0.5 mg/dL (ref 0.3–1.2)
BUN: 15 mg/dL (ref 8–23)
CALCIUM: 9.3 mg/dL (ref 8.9–10.3)
CO2: 29 mmol/L (ref 22–32)
CREATININE: 1.19 mg/dL — AB (ref 0.44–1.00)
Chloride: 98 mmol/L (ref 98–111)
GFR calc Af Amer: 48 mL/min — ABNORMAL LOW (ref >60)
GFR calc non Af Amer: 41 mL/min — ABNORMAL LOW (ref >60)
GLUCOSE: 112 mg/dL — AB (ref 70–99)
Potassium: 3.4 mmol/L — ABNORMAL LOW (ref 3.5–5.1)
SODIUM: 136 mmol/L (ref 135–145)
TOTAL PROTEIN: 7.6 g/dL (ref 6.5–8.1)

## 2018-04-13 LAB — CBC WITH DIFFERENTIAL/PLATELET
Abs Immature Granulocytes: 0.04 10*3/uL (ref 0.00–0.07)
BASOS ABS: 0 10*3/uL (ref 0.0–0.1)
BASOS PCT: 0 %
EOS ABS: 0.1 10*3/uL (ref 0.0–0.5)
Eosinophils Relative: 1 %
HCT: 33 % — ABNORMAL LOW (ref 36.0–46.0)
Hemoglobin: 10.2 g/dL — ABNORMAL LOW (ref 12.0–15.0)
Immature Granulocytes: 0 %
LYMPHS ABS: 0.8 10*3/uL (ref 0.7–4.0)
Lymphocytes Relative: 9 %
MCH: 30.4 pg (ref 26.0–34.0)
MCHC: 30.9 g/dL (ref 30.0–36.0)
MCV: 98.2 fL (ref 80.0–100.0)
MONO ABS: 0.7 10*3/uL (ref 0.1–1.0)
Monocytes Relative: 7 %
NEUTROS PCT: 83 %
Neutro Abs: 7.6 10*3/uL (ref 1.7–7.7)
PLATELETS: 251 10*3/uL (ref 150–400)
RBC: 3.36 MIL/uL — AB (ref 3.87–5.11)
RDW: 13.5 % (ref 11.5–15.5)
WBC: 9.2 10*3/uL (ref 4.0–10.5)
nRBC: 0 % (ref 0.0–0.2)

## 2018-04-13 LAB — PREPARE RBC (CROSSMATCH)

## 2018-04-13 MED ORDER — MUPIROCIN 2 % EX OINT
1.0000 "application " | TOPICAL_OINTMENT | Freq: Two times a day (BID) | CUTANEOUS | Status: DC
  Administered 2018-04-13 – 2018-04-16 (×5): 1 via NASAL
  Filled 2018-04-13: qty 22

## 2018-04-13 MED ORDER — HYDROCODONE-ACETAMINOPHEN 5-325 MG PO TABS: 1.0000 | ORAL_TABLET | Freq: Four times a day (QID) | ORAL | Status: DC | PRN

## 2018-04-13 MED ORDER — LEVETIRACETAM 100 MG/ML PO SOLN
250.0000 mg | Freq: Two times a day (BID) | ORAL | Status: DC
  Administered 2018-04-13 – 2018-04-16 (×5): 250 mg via ORAL
  Filled 2018-04-13 (×10): qty 2.5

## 2018-04-13 MED ORDER — SODIUM CHLORIDE 0.9 % IV SOLN
INTRAVENOUS | Status: DC
  Administered 2018-04-13 – 2018-04-14 (×2): via INTRAVENOUS

## 2018-04-13 MED ORDER — DONEPEZIL HCL 5 MG PO TABS
10.0000 mg | ORAL_TABLET | Freq: Every day | ORAL | Status: DC
  Administered 2018-04-13 – 2018-04-15 (×3): 10 mg via ORAL
  Filled 2018-04-13 (×6): qty 2

## 2018-04-13 MED ORDER — HEPARIN SODIUM (PORCINE) 5000 UNIT/ML IJ SOLN
5000.0000 [IU] | Freq: Three times a day (TID) | INTRAMUSCULAR | Status: DC
  Administered 2018-04-13 – 2018-04-14 (×2): 5000 [IU] via SUBCUTANEOUS
  Filled 2018-04-13 (×2): qty 1

## 2018-04-13 MED ORDER — FERROUS SULFATE 325 (65 FE) MG PO TABS
325.0000 mg | ORAL_TABLET | Freq: Two times a day (BID) | ORAL | Status: DC
  Administered 2018-04-13 – 2018-04-15 (×3): 325 mg via ORAL
  Filled 2018-04-13 (×9): qty 1

## 2018-04-13 MED ORDER — SODIUM CHLORIDE 0.9% IV SOLUTION
Freq: Once | INTRAVENOUS | Status: AC
  Administered 2018-04-13: 23:00:00 via INTRAVENOUS

## 2018-04-13 MED ORDER — MORPHINE SULFATE (PF) 2 MG/ML IV SOLN
0.5000 mg | INTRAVENOUS | Status: DC | PRN
  Administered 2018-04-14 – 2018-04-15 (×2): 0.5 mg via INTRAVENOUS
  Filled 2018-04-13 (×2): qty 1

## 2018-04-13 MED ORDER — PNEUMOCOCCAL VAC POLYVALENT 25 MCG/0.5ML IJ INJ
0.5000 mL | INJECTION | INTRAMUSCULAR | Status: DC
  Filled 2018-04-13: qty 0.5

## 2018-04-13 MED ORDER — METHOCARBAMOL 1000 MG/10ML IJ SOLN
500.0000 mg | Freq: Four times a day (QID) | INTRAVENOUS | Status: DC | PRN
  Filled 2018-04-13: qty 5

## 2018-04-13 MED ORDER — POTASSIUM CHLORIDE CRYS ER 20 MEQ PO TBCR
20.0000 meq | EXTENDED_RELEASE_TABLET | Freq: Once | ORAL | Status: AC
  Administered 2018-04-13: 20 meq via ORAL
  Filled 2018-04-13: qty 1

## 2018-04-13 MED ORDER — METHOCARBAMOL 500 MG PO TABS
500.0000 mg | ORAL_TABLET | Freq: Four times a day (QID) | ORAL | Status: DC | PRN
  Administered 2018-04-15: 500 mg via ORAL
  Filled 2018-04-13: qty 1

## 2018-04-13 MED ORDER — TRAZODONE HCL 50 MG PO TABS
25.0000 mg | ORAL_TABLET | Freq: Every day | ORAL | Status: DC
  Administered 2018-04-13 – 2018-04-15 (×3): 25 mg via ORAL
  Filled 2018-04-13 (×3): qty 1

## 2018-04-13 NOTE — H&P (Addendum)
TRH H&P    Patient Demographics:    Tina Kline, is a 85 y.o. female  MRN: [id number]  DOB - 06-06-1929  Admit Date - 04/13/2018  Referring MD/NP/PA: Aundria Mems  Outpatient Primary MD for the patient is [name], MD  Patient coming from: Skilled nursing facility  Chief complaint-fall   HPI:    Tina Kline  is a 85 y.o. female,With history of dementia, seizure disorder on Keppra, who is currently residing at skilled nursing facility was brought to ED after patient had a fall.  Patient was brought to the ED and imaging studies showed right femoral neck fracture.  Patient denies passing out.  No chest pain or shortness of breath. No nausea vomiting or diarrhea. Patient does not remember exact event which led to fall. Orthopedic surgery was consulted and plan for ORIF in a.m. She has no history of CAD No history of diabetes mellitus. She has history of seizures and is currently on Keppra. She has not had seizure for  past 1 year    Review of systems:    In addition to the HPI above,    All other systems reviewed and are negative.    Past History of the following :    Past Medical History:  Diagnosis Date  . Anemia   . Dementia ([place])   . Hypertension   . Seizures ([place])       Past Surgical History:  Procedure Laterality Date  . APPENDECTOMY    . BIOPSY  06/22/2017   Procedure: BIOPSY;  Surgeon: Daneil Dolin, MD;  [facility];  Service: Endoscopy;;  gastric   . COLONOSCOPY N/A 06/26/2015   Dr. Oneida Alar: Single tubular adenoma removed.  No future colonoscopies for surveillance purposes given age.  . ESOPHAGEAL DILATION N/A 06/22/2017   Procedure: ESOPHAGEAL DILATION;  Surgeon: Daneil Dolin, MD;  [facility];  Service: Endoscopy;  Laterality: N/A;  . ESOPHAGOGASTRODUODENOSCOPY N/A 01/29/2016   Dr. Oneida Alar: proximal esophageal web s/p dilation, large  hiatal hernia, gastritis  . ESOPHAGOGASTRODUODENOSCOPY N/A 03/08/2016   Dr. Oneida Alar: proximal esophagel web s/p dilation, gastritis, moderate hiatal hernia  . ESOPHAGOGASTRODUODENOSCOPY  06/2015   Dr. Oneida Alar: Large hiatal hernia, gastritis, proximal esophageal web status post dilation  . ESOPHAGOGASTRODUODENOSCOPY (EGD) WITH PROPOFOL N/A 06/22/2017   Procedure: ESOPHAGOGASTRODUODENOSCOPY (EGD) WITH PROPOFOL;  Surgeon: Daneil Dolin, MD;  [facility];  Service: Endoscopy;  Laterality: N/A;  . MOUTH SURGERY    . SAVORY DILATION N/A 01/29/2016   Procedure: SAVORY DILATION;  Surgeon: Danie Binder, MD;  [facility];  Service: Endoscopy;  Laterality: N/A;  . SAVORY DILATION N/A 03/08/2016   Procedure: SAVORY DILATION;  Surgeon: Danie Binder, MD;  [facility];  Service: Endoscopy;  Laterality: N/A;      Social History:      Social History   Tobacco Use  . Smoking status: Never Smoker  . Smokeless tobacco: Never Used  Substance Use Topics  . Alcohol use: No  Family History :     Family History  Problem Relation Age of Onset  . Breast cancer Daughter   . Pancreatic cancer Son   . Colon cancer Son       Home Medications:   Prior to Admission medications   Medication Sig Start Date End Date Taking? Authorizing Provider  acetaminophen (TYLENOL) 325 MG tablet Take 650 mg by mouth every 6 (six) hours as needed for mild pain or headache.    Yes [provider]  donepezil (ARICEPT) 10 MG tablet Take 10 mg by mouth at bedtime.   Yes [provider]  ferrous sulfate 325 (65 FE) MG EC tablet Take 1 tablet (325 mg total) by mouth 2 (two) times daily. 06/23/17 06/23/18 Yes Erline Hau, MD  levETIRAcetam (KEPPRA) 100 MG/ML solution Take 250 mg by mouth 2 (two) times daily.   Yes [provider]  torsemide (DEMADEX) 20 MG tablet Take 30 mg by mouth every morning.   Yes [provider]  traZODone  (DESYREL) 50 MG tablet Take 0.5 tablets (25 mg total) by mouth at bedtime. 01/03/18  Yes Barton Dubois, MD  vitamin C (ASCORBIC ACID) 500 MG tablet Take 500 mg by mouth 2 (two) times daily.   Yes [provider]     Allergies:    No Known Allergies   Physical Exam:   Vitals  Blood pressure (!) 156/76, pulse 91, temperature 98.1 F (36.7 C), temperature source Oral, resp. rate 14, weight 49.9 kg, SpO2 99 %.  1.  General: Appears in no acute distress  2. Psychiatric: Alert, oriented x2  3. Neurologic: Cranial nerves II through grossly intact, moving all extremities  4. HEENMT:  Atraumatic normocephalic, oral mucosa is moist  5. Respiratory : Clear to auscultation bilaterally, no wheezing or crackles  6. Cardiovascular : S1-S2, regular, no murmur auscultated  7. Gastrointestinal:  Abdomen is soft, nontender, no organomegaly     Data Review:    CBC Recent Labs  Lab 04/13/18 1711  WBC 9.2  HGB 10.2*  HCT 33.0*  PLT 251  MCV 98.2  MCH 30.4  MCHC 30.9  RDW 13.5  LYMPHSABS 0.8  MONOABS 0.7  EOSABS 0.1  BASOSABS 0.0   ------------------------------------------------------------------------------------------------------------------  Results for orders placed or performed during the hospital encounter of 04/13/18 (from the past 48 hour(s))  Comprehensive metabolic panel     Status: Abnormal   Collection Time: 04/13/18  5:11 PM  Result Value Ref Range   Sodium 136 135 - 145 mmol/L   Potassium 3.4 (L) 3.5 - 5.1 mmol/L   Chloride 98 98 - 111 mmol/L   CO2 29 22 - 32 mmol/L   Glucose, Bld 112 (H) 70 - 99 mg/dL   BUN 15 8 - 23 mg/dL   Creatinine, Ser 1.19 (H) 0.44 - 1.00 mg/dL   Calcium 9.3 8.9 - 10.3 mg/dL   Total Protein 7.6 6.5 - 8.1 g/dL   Albumin 3.9 3.5 - 5.0 g/dL   AST 29 15 - 41 U/L   ALT 47 (H) 0 - 44 U/L   Alkaline Phosphatase 157 (H) 38 - 126 U/L   Total Bilirubin 0.5 0.3 - 1.2 mg/dL   GFR calc non Af Amer 41 (L) >60 mL/min   GFR calc  Af Amer 48 (L) >60 mL/min   Anion gap 9 5 - 15    Comment: Performed at [facility], [address] [place], [place] [address]  CBC with Differential/Platelet  Status: Abnormal   Collection Time: 04/13/18  5:11 PM  Result Value Ref Range   WBC 9.2 4.0 - 10.5 K/uL   RBC 3.36 (L) 3.87 - 5.11 MIL/uL   Hemoglobin 10.2 (L) 12.0 - 15.0 g/dL   HCT 33.0 (L) 36.0 - 46.0 %   MCV 98.2 80.0 - 100.0 fL   MCH 30.4 26.0 - 34.0 pg   MCHC 30.9 30.0 - 36.0 g/dL   RDW 13.5 11.5 - 15.5 %   Platelets 251 150 - 400 K/uL   nRBC 0.0 0.0 - 0.2 %   Neutrophils Relative % 83 %   Neutro Abs 7.6 1.7 - 7.7 K/uL   Lymphocytes Relative 9 %   Lymphs Abs 0.8 0.7 - 4.0 K/uL   Monocytes Relative 7 %   Monocytes Absolute 0.7 0.1 - 1.0 K/uL   Eosinophils Relative 1 %   Eosinophils Absolute 0.1 0.0 - 0.5 K/uL   Basophils Relative 0 %   Basophils Absolute 0.0 0.0 - 0.1 K/uL   Immature Granulocytes 0 %   Abs Immature Granulocytes 0.04 0.00 - 0.07 K/uL    Comment: Performed at [facility], [address] [place], [place] [address]    Chemistries  Recent Labs  Lab 04/13/18 1711  NA 136  K 3.4*  CL 98  CO2 29  GLUCOSE 112*  BUN 15  CREATININE 1.19*  CALCIUM 9.3  AST 29  ALT 47*  ALKPHOS 157*  BILITOT 0.5   ------------------------------------------------------------------------------------------------------------------  ------------------------------------------------------------------------------------------------------------------ GFR: Estimated Creatinine Clearance: 25.6 mL/min (A) (by C-G formula based on SCr of 1.19 mg/dL (H)). Liver Function Tests: Recent Labs  Lab 04/13/18 1711  AST 29  ALT 47*  ALKPHOS 157*  BILITOT 0.5  PROT 7.6  ALBUMIN 3.9   No results for input(s): LIPASE, AMYLASE in the last 168 hours. No results for input(s): AMMONIA in the last 168 hours. Coagulation Profile: No results for input(s): INR, PROTIME in the last 168 hours. Cardiac Enzymes: No results for  input(s): CKTOTAL, CKMB, CKMBINDEX, TROPONINI in the last 168 hours. BNP (last 3 results) No results for input(s): PROBNP in the last 8760 hours. HbA1C: No results for input(s): HGBA1C in the last 72 hours. CBG: No results for input(s): GLUCAP in the last 168 hours. Lipid Profile: No results for input(s): CHOL, HDL, LDLCALC, TRIG, CHOLHDL, LDLDIRECT in the last 72 hours. Thyroid Function Tests: No results for input(s): TSH, T4TOTAL, FREET4, T3FREE, THYROIDAB in the last 72 hours. Anemia Panel: No results for input(s): VITAMINB12, FOLATE, FERRITIN, TIBC, IRON, RETICCTPCT in the last 72 hours.  --------------------------------------------------------------------------------------------------------------- Urine analysis:    Component Value Date/Time   COLORURINE YELLOW 01/01/2018 1404   APPEARANCEUR CLEAR 01/01/2018 1404   LABSPEC 1.010 01/01/2018 1404   PHURINE 5.0 01/01/2018 1404   GLUCOSEU NEGATIVE 01/01/2018 1404   HGBUR NEGATIVE 01/01/2018 1404   BILIRUBINUR NEGATIVE 01/01/2018 1404   KETONESUR NEGATIVE 01/01/2018 1404   PROTEINUR NEGATIVE 01/01/2018 1404   UROBILINOGEN 0.2 01/15/2013 0425   NITRITE NEGATIVE 01/01/2018 1404   LEUKOCYTESUR NEGATIVE 01/01/2018 1404      Imaging Results:    Dg Chest 1 View  Result Date: 04/13/2018 CLINICAL DATA:  Fall with femur fracture EXAM: CHEST  1 VIEW COMPARISON:  01/01/2018 FINDINGS: No acute opacity or pleural effusion. Normal cardiomediastinal silhouette. Moderate hiatal hernia. IMPRESSION: No active disease.  Hiatal hernia Electronically Signed   By: Donavan Foil M.D.   On: 04/13/2018 17:29   Ct Head Wo Contrast  Result Date: 04/13/2018 CLINICAL  DATA:  Per ED notes: Pt is from [facility]. Pt fell early this morning. Xray performed at SNF, and it showed a RT femur fx. Family stated that patient has not slept for two days. She has advanced dementia. Pt sleepy at this time. EXAM: CT HEAD WITHOUT CONTRAST TECHNIQUE: Contiguous axial  images were obtained from the base of the skull through the vertex without intravenous contrast. COMPARISON:  01/01/2018, 06/21/2017, 06/20/2017 FINDINGS: Brain: Partially calcified extra-axial mass in the LEFT parietal region is stable in appearance compared to prior studies, measuring approximately 14 millimeters today. By a previous MRI, characterization by prior MRI it was consistent with meningioma. Small RIGHT frontal calcified meningioma is 5.6 centimeters. There is central and cortical atrophy. Periventricular white matter changes are consistent with small vessel disease. No acute infarct or intracranial hemorrhage. Vascular: There is atherosclerotic calcification of the internal carotid arteries. No hyperdense vessels. Skull: Normal. Negative for fracture or focal lesion. Sinuses/Orbits: Mild mucosal swelling of the paranasal sinuses. Other: None IMPRESSION: 1. No evidence for acute intracranial abnormality. 2. Atrophy and small vessel disease. 3. Stable LEFT parietal and RIGHT frontal meningiomas. No mass effect or edema. Electronically Signed   By: Nolon Nations M.D.   On: 04/13/2018 16:43   Dg Knee Complete 4 Views Right  Result Date: 04/13/2018 CLINICAL DATA:  Fall this morning, right hip fracture EXAM: RIGHT KNEE - COMPLETE 4+ VIEW COMPARISON:  05/13/2013 right knee radiograph FINDINGS: Diffuse osteopenia. Trace suprapatellar right knee joint effusion. No right knee fracture or dislocation. Moderate tricompartmental right knee osteoarthritis, unchanged. No suspicious focal osseous lesions. No radiopaque foreign body. IMPRESSION: Trace suprapatellar right knee joint effusion. No right knee fracture or dislocation. Diffuse osteopenia. Moderate tricompartmental right knee osteoarthritis. Electronically Signed   By: Ilona Sorrel M.D.   On: 04/13/2018 19:25   Dg Hips Bilat With Pelvis Min 5 Views  Result Date: 04/13/2018 CLINICAL DATA:  Fall EXAM: DG HIP (WITH OR WITHOUT PELVIS) 5+V BILAT  COMPARISON:  None. FINDINGS: Multiple pelvic calcifications, could be uterine in etiology. Bowel gas obscures the sacrum. Pubic symphysis appears intact. No acute displaced fracture or malalignment at the left hip. Acute displaced and likely impacted fracture at the base of the femoral neck with extension into the trochanteric region. No femoral head dislocation. IMPRESSION: 1. Acute displaced and slightly impacted appearing fracture involving the base of the femoral neck and trochanteric region of the right femur. 2. No acute osseous abnormality of the left hip. 3. Multiple coarse calcifications in the pelvis are presumably uterine in etiology Electronically Signed   By: Donavan Foil M.D.   On: 04/13/2018 17:28    My personal review of EKG: Rhythm NSR, artifact is noted   Assessment & Plan:    Active Problems:   Femur fracture, right ([place])   1. Right femur fracture-x-ray of the hip showed acute displaced and slightly impacted appearing fracture involving the base of femoral neck and trochanteric region of the right femur.  We will keep her n.p.o., orthopedic surgery has been consulted and plan for surgery in a.m.  Will initiate hip fracture protocol, Vicodin as needed for pain, Robaxin for muscle spasm.  2. Seizure disorder-controlled, continue Keppra.  No seizure in past 1 year as per daughter.  3. Dementia-no behavior disturbance, continue Aricept.  4. Diuretic use-patient is on Demadex as per home medication list, no clear indication for diuretic.  Last echo from November 2019 showed EF 60 to 65%, mild LVH, anyhow we will hold Demadex  at this time and consider restarting after surgery.  5. Risk stratification-patient is moderate risk for surgery considering her age, she has no history of CAD, has been ambulating at the skilled facility.    DVT Prophylaxis-Heparin  AM Labs Ordered, also please review Full Orders  Family Communication: Admission, patients condition and plan of care  including tests being ordered have been discussed with the patient and her daughter at bedside who indicate understanding and agree with the plan and Code Status.  Code Status: Full code  Admission status: Inpatient: Based on patients clinical presentation and evaluation of above clinical data, I have made determination that patient meets Inpatient criteria at this time.  Time spent in minutes : 60 minutes   Oswald Hillock M.D on 04/13/2018 at 7:59 PM

## 2018-04-13 NOTE — ED Provider Notes (Signed)
[facility] Provider Note   CSN: [id number] Arrival date & time: 04/13/18  1537    History   Chief Complaint Chief Complaint  Patient presents with  . Fall    HPI Tina Kline is a 85 y.o. female.     Patient sent in from [place].  Patient had a fall in the morning.  They apparently did x-rays of her right hip which showed a fracture.  Patient has a history of dementia.  It is advanced.  She is a full code.  Patient does walk at times with assistance.  Has been ambulatory on and off usually very active.  Patient not bedridden.     Past Medical History:  Diagnosis Date  . Anemia   . Dementia ([place])   . Hypertension   . Seizures [facility])     Patient Active Problem List   Diagnosis Date Noted  . Protein-calorie malnutrition, severe 01/03/2018  . Essential hypertension   . Failure to thrive in adult 01/02/2018  . Acute renal failure superimposed on stage 3 chronic kidney disease ([place]) 01/02/2018  . Goals of care, counseling/discussion 01/02/2018  . Dehydration   . Hyponatremia   . Syncope and collapse   . Palliative care by specialist   . Dementia ([place]) 01/01/2018  . H. pylori infection 09/27/2017  . Dark stools 09/27/2017  . Syncope 06/20/2017  . Anemia   . Dysphagia 01/20/2016  . IDA (iron deficiency anemia) 06/08/2015  . Chronic renal disease 06/08/2015  . Arthritis of knee, degenerative 06/13/2013    Past Surgical History:  Procedure Laterality Date  . APPENDECTOMY    . BIOPSY  06/22/2017   Procedure: BIOPSY;  Surgeon: Daneil Dolin, MD;  [facility];  Service: Endoscopy;;  gastric   . COLONOSCOPY N/A 06/26/2015   Dr. Oneida Alar: Single tubular adenoma removed.  No future colonoscopies for surveillance purposes given age.  . ESOPHAGEAL DILATION N/A 06/22/2017   Procedure: ESOPHAGEAL DILATION;  Surgeon: Daneil Dolin, MD;  Location: [facility];  Service: Endoscopy;  Laterality: N/A;  .  ESOPHAGOGASTRODUODENOSCOPY N/A 01/29/2016   Dr. Oneida Alar: proximal esophageal web s/p dilation, large hiatal hernia, gastritis  . ESOPHAGOGASTRODUODENOSCOPY N/A 03/08/2016   Dr. Oneida Alar: proximal esophagel web s/p dilation, gastritis, moderate hiatal hernia  . ESOPHAGOGASTRODUODENOSCOPY  06/2015   Dr. Oneida Alar: Large hiatal hernia, gastritis, proximal esophageal web status post dilation  . ESOPHAGOGASTRODUODENOSCOPY (EGD) WITH PROPOFOL N/A 06/22/2017   Procedure: ESOPHAGOGASTRODUODENOSCOPY (EGD) WITH PROPOFOL;  Surgeon: Daneil Dolin, MD;  [facility];  Service: Endoscopy;  Laterality: N/A;  . MOUTH SURGERY    . SAVORY DILATION N/A 01/29/2016   Procedure: SAVORY DILATION;  Surgeon: Danie Binder, MD;  [facility];  Service: Endoscopy;  Laterality: N/A;  . SAVORY DILATION N/A 03/08/2016   Procedure: SAVORY DILATION;  Surgeon: Danie Binder, MD;  Location[facility];  Service: Endoscopy;  Laterality: N/A;     OB History    Gravida  6   Para  6   Term  6   Preterm      AB      Living  4     SAB      TAB      Ectopic      Multiple      Live Births               Home Medications    Prior to Admission medications  Medication Sig Start Date End Date Taking? Authorizing Provider  acetaminophen (TYLENOL) 500 MG tablet Take 500 mg by mouth every 6 (six) hours as needed for mild pain or headache.    [provider]  donepezil (ARICEPT) 10 MG tablet Take 10 mg by mouth at bedtime.    [provider]  ferrous sulfate 325 (65 FE) MG EC tablet Take 1 tablet (325 mg total) by mouth 2 (two) times daily. 06/23/17 06/23/18  Isaac Bliss, Rayford Halsted, MD  levETIRAcetam (KEPPRA) 250 MG tablet Take 1 tablet (250 mg total) by mouth 2 (two) times daily. 06/23/17   Isaac Bliss, Rayford Halsted, MD  traZODone (DESYREL) 50 MG tablet Take 0.5 tablets (25 mg total) by mouth at bedtime. 01/03/18   Barton Dubois, MD    Family History Family History    Problem Relation Age of Onset  . Breast cancer Daughter   . Pancreatic cancer Son   . Colon cancer Son     Social History Social History   Tobacco Use  . Smoking status: Never Smoker  . Smokeless tobacco: Never Used  Substance Use Topics  . Alcohol use: No  . Drug use: No     Allergies   Patient has no known allergies.   Review of Systems Review of Systems  Unable to perform ROS: Dementia     Physical Exam Updated Vital Signs BP (!) 153/62   Pulse 89   Temp 98.1 F (36.7 C) (Oral)   Resp 16   Wt 49.9 kg   SpO2 100%   BMI 20.78 kg/m   Physical Exam Vitals signs and nursing note reviewed.  Constitutional:      General: She is not in acute distress.    Appearance: Normal appearance. She is well-developed.  HENT:     Head: Normocephalic and atraumatic.     Nose: No congestion.     Mouth/Throat:     Mouth: Mucous membranes are moist.  Eyes:     Extraocular Movements: Extraocular movements intact.     Conjunctiva/sclera: Conjunctivae normal.     Pupils: Pupils are equal, round, and reactive to light.  Neck:     Musculoskeletal: Normal range of motion and neck supple.  Cardiovascular:     Rate and Rhythm: Regular rhythm. Tachycardia present.     Heart sounds: Normal heart sounds. No murmur.  Pulmonary:     Effort: Pulmonary effort is normal. No respiratory distress.     Breath sounds: Normal breath sounds.  Abdominal:     General: Bowel sounds are normal.     Palpations: Abdomen is soft.     Tenderness: There is no abdominal tenderness.  Musculoskeletal: Normal range of motion.        General: Tenderness and deformity present.     Comments: External rotation and shortening of the right leg.  Dorsalis pedis pulses 2+.  Also questionable deformity around the hip..  Patient has some tenderness to palpation to the right knee.  Left lower extremity without any abnormalities or any tenderness.  Skin:    General: Skin is warm and dry.     Capillary Refill:  Capillary refill takes less than 2 seconds.  Neurological:     General: No focal deficit present.     Mental Status: She is alert and oriented to person, place, and time.     Cranial Nerves: No cranial nerve deficit.     Sensory: No sensory deficit.     Motor: No weakness.  Coordination: Coordination normal.      ED Treatments / Results  Labs (all labs ordered are listed, but only abnormal results are displayed) Labs Reviewed  COMPREHENSIVE METABOLIC PANEL - Abnormal; Notable for the following components:      Result Value   Potassium 3.4 (*)    Glucose, Bld 112 (*)    Creatinine, Ser 1.19 (*)    ALT 47 (*)    Alkaline Phosphatase 157 (*)    GFR calc non Af Amer 41 (*)    GFR calc Af Amer 48 (*)    All other components within normal limits  CBC WITH DIFFERENTIAL/PLATELET - Abnormal; Notable for the following components:   RBC 3.36 (*)    Hemoglobin 10.2 (*)    HCT 33.0 (*)    All other components within normal limits    EKG EKG Interpretation  Date/Time:  Friday April 13 2018 16:19:34 EST Ventricular Rate:  83 PR Interval:    QRS Duration: 91 QT Interval:  365 QTC Calculation: 429 R Axis:   42 Text Interpretation:  Sinus rhythm Borderline low voltage, extremity leads Abnormal R-wave progression, early transition Baseline wander in lead(s) V3 V4 V6 Interpretation limited secondary to artifact Confirmed by Fredia Sorrow [phone]) on 04/13/2018 4:30:25 PM   Radiology Dg Chest 1 View  Result Date: 04/13/2018 CLINICAL DATA:  Fall with femur fracture EXAM: CHEST  1 VIEW COMPARISON:  01/01/2018 FINDINGS: No acute opacity or pleural effusion. Normal cardiomediastinal silhouette. Moderate hiatal hernia. IMPRESSION: No active disease.  Hiatal hernia Electronically Signed   By: Donavan Foil M.D.   On: 04/13/2018 17:29   Ct Head Wo Contrast  Result Date: 04/13/2018 CLINICAL DATA:  Per ED notes: Pt is from [facility]. Pt fell early this morning. Xray performed at SNF, and it  showed a RT femur fx. Family stated that patient has not slept for two days. She has advanced dementia. Pt sleepy at this time. EXAM: CT HEAD WITHOUT CONTRAST TECHNIQUE: Contiguous axial images were obtained from the base of the skull through the vertex without intravenous contrast. COMPARISON:  01/01/2018, 06/21/2017, 06/20/2017 FINDINGS: Brain: Partially calcified extra-axial mass in the LEFT parietal region is stable in appearance compared to prior studies, measuring approximately 14 millimeters today. By a previous MRI, characterization by prior MRI it was consistent with meningioma. Small RIGHT frontal calcified meningioma is 5.6 centimeters. There is central and cortical atrophy. Periventricular white matter changes are consistent with small vessel disease. No acute infarct or intracranial hemorrhage. Vascular: There is atherosclerotic calcification of the internal carotid arteries. No hyperdense vessels. Skull: Normal. Negative for fracture or focal lesion. Sinuses/Orbits: Mild mucosal swelling of the paranasal sinuses. Other: None IMPRESSION: 1. No evidence for acute intracranial abnormality. 2. Atrophy and small vessel disease. 3. Stable LEFT parietal and RIGHT frontal meningiomas. No mass effect or edema. Electronically Signed   By: Nolon Nations M.D.   On: 04/13/2018 16:43   Dg Hips Bilat With Pelvis Min 5 Views  Result Date: 04/13/2018 CLINICAL DATA:  Fall EXAM: DG HIP (WITH OR WITHOUT PELVIS) 5+V BILAT COMPARISON:  None. FINDINGS: Multiple pelvic calcifications, could be uterine in etiology. Bowel gas obscures the sacrum. Pubic symphysis appears intact. No acute displaced fracture or malalignment at the left hip. Acute displaced and likely impacted fracture at the base of the femoral neck with extension into the trochanteric region. No femoral head dislocation. IMPRESSION: 1. Acute displaced and slightly impacted appearing fracture involving the base of the femoral neck and trochanteric  region of  the right femur. 2. No acute osseous abnormality of the left hip. 3. Multiple coarse calcifications in the pelvis are presumably uterine in etiology Electronically Signed   By: Donavan Foil M.D.   On: 04/13/2018 17:28    Procedures Procedures (including critical care time)  Medications Ordered in ED Medications  0.9 %  sodium chloride infusion ( Intravenous New Bag/Given 04/13/18 1715)     Initial Impression / Assessment and Plan / ED Course  I have reviewed the triage vital signs and the nursing notes.  Pertinent labs & imaging results that were available during my care of the patient were reviewed by me and considered in my medical decision making (see chart for details).       Patient with fall does have right hip fracture.  Discussed with Dr. Aline Brochure.  He says he will be had a repair tomorrow.  Patient will be admitted by the hospitalist for medical clearance.  Family does want a hip repaired.  Patient is currently a full code.  Does have dementia but had been ambulatory prior to this fall.  Head CT negative no evidence of any acute changes there due to the knee pain x-ray of the right knee was done no acute bony abnormalities there.  Hospitalist will admit for medical clearance Dr. Aline Brochure will consult for the hip fracture.  He is aware of the injury and planning repair tomorrow.  Final Clinical Impressions(s) / ED Diagnoses   Final diagnoses:  Fall, initial encounter  Closed fracture of right hip, initial encounter [facility])    ED Discharge Orders    None       Fredia Sorrow, MD 04/14/18 0002

## 2018-04-13 NOTE — Progress Notes (Signed)
Patient admitted to [facility] witt the diagnosis of femur fracture. Alert and oriented to self. No signs and  Symptoms of pain noted at this time. Full assessment to epic completed with the assistance from the family. Will continue to monitor.

## 2018-04-13 NOTE — ED Notes (Signed)
Pt to xray for knee

## 2018-04-13 NOTE — Progress Notes (Signed)
Patient ID: Tina Kline, female   DOB: 1929-06-30, 85 y.o.   MRN: [id number] BP (!) 156/76   Pulse 91   Temp 98.1 F (36.7 C) (Oral)   Resp 14   Wt 49.9 kg   SpO2 99%   BMI 20.78 kg/m   Right hip fracture in 85 yo female with continued declining health. Disposition surgical decision will have to be discussed with the family   Keep npo   Hold anticoagulants after midnight in case family wants surgery done   Right peritrochanteric hip fracture / best result will be ORIF , BUT depends on family decision

## 2018-04-13 NOTE — Progress Notes (Signed)
Patient ID: Tina Kline, female   DOB: 1929-07-25, 85 y.o.   MRN: [id number] Preoperative note  Diagnosis right hip fracture // peritrochanteric  Plan surgical treatment orif gamma nail   CBC  CBC Latest Ref Rng & Units 04/13/2018 01/02/2018 01/01/2018  WBC 4.0 - 10.5 K/uL 9.2 - 4.0  Hemoglobin 12.0 - 15.0 g/dL 10.2(L) - 9.1(L)  Hematocrit 36.0 - 46.0 % 33.0(L) 22.4(L) 28.7(L)  Platelets 150 - 400 K/uL 251 - 410(H)     BASIC metabolic panel:  BMP Latest Ref Rng & Units 04/13/2018 01/03/2018 01/02/2018  Glucose 70 - 99 mg/dL 112(H) 93 92  BUN 8 - 23 mg/dL 15 41(H) 51(H)  Creatinine 0.44 - 1.00 mg/dL 1.19(H) 1.76(H) 2.02(H)  Sodium 135 - 145 mmol/L 136 134(L) 133(L)  Potassium 3.5 - 5.1 mmol/L 3.4(L) 5.0 5.0  Chloride 98 - 111 mmol/L 98 110 104  CO2 22 - 32 mmol/L 29 20(L) 21(L)  Calcium 8.9 - 10.3 mg/dL 9.3 8.8(L) 9.3   I ve discussed the situation with next of kin   Ms Wynetta Emery:  She wishes to proceed with surgery

## 2018-04-13 NOTE — ED Notes (Signed)
Repaged Dr Aline Brochure to Dr Rogene Houston

## 2018-04-13 NOTE — ED Notes (Signed)
Pt to xray. Will get EKG and lab work upon return

## 2018-04-13 NOTE — ED Triage Notes (Signed)
Pt is from [facility]. Pt fell early this morning. Xray performed at SNF, and it showed a RT femur fx. Family stated that patient has not slept for two days. She has advanced dementia. Pt sleepy at this time.

## 2018-04-14 ENCOUNTER — Inpatient Hospital Stay (HOSPITAL_COMMUNITY): Payer: Medicare Other

## 2018-04-14 ENCOUNTER — Inpatient Hospital Stay (HOSPITAL_COMMUNITY): Payer: Medicare Other | Admitting: Anesthesiology

## 2018-04-14 ENCOUNTER — Encounter (HOSPITAL_COMMUNITY)
Admission: EM | Disposition: A | Discharge status: Skilled Nursing Facility | Payer: Self-pay | Source: Home / Self Care | Attending: Internal Medicine

## 2018-04-14 DIAGNOSIS — F0390 Unspecified dementia without behavioral disturbance: Secondary | ICD-10-CM

## 2018-04-14 DIAGNOSIS — D649 Anemia, unspecified: Secondary | ICD-10-CM

## 2018-04-14 DIAGNOSIS — R569 Unspecified convulsions: Secondary | ICD-10-CM

## 2018-04-14 DIAGNOSIS — S72141A Displaced intertrochanteric fracture of right femur, initial encounter for closed fracture: Principal | ICD-10-CM

## 2018-04-14 DIAGNOSIS — S72001D Fracture of unspecified part of neck of right femur, subsequent encounter for closed fracture with routine healing: Secondary | ICD-10-CM

## 2018-04-14 DIAGNOSIS — S72001A Fracture of unspecified part of neck of right femur, initial encounter for closed fracture: Secondary | ICD-10-CM

## 2018-04-14 DIAGNOSIS — I10 Essential (primary) hypertension: Secondary | ICD-10-CM

## 2018-04-14 HISTORY — PX: INTRAMEDULLARY (IM) NAIL INTERTROCHANTERIC: SHX5875

## 2018-04-14 LAB — CBC
HCT: 28 % — ABNORMAL LOW (ref 36.0–46.0)
HEMOGLOBIN: 8.6 g/dL — AB (ref 12.0–15.0)
MCH: 30 pg (ref 26.0–34.0)
MCHC: 30.7 g/dL (ref 30.0–36.0)
MCV: 97.6 fL (ref 80.0–100.0)
Platelets: 196 10*3/uL (ref 150–400)
RBC: 2.87 MIL/uL — ABNORMAL LOW (ref 3.87–5.11)
RDW: 13.6 % (ref 11.5–15.5)
WBC: 5.5 10*3/uL (ref 4.0–10.5)
nRBC: 0 % (ref 0.0–0.2)

## 2018-04-14 LAB — BASIC METABOLIC PANEL
Anion gap: 9 (ref 5–15)
BUN: 16 mg/dL (ref 8–23)
CO2: 27 mmol/L (ref 22–32)
Calcium: 8.8 mg/dL — ABNORMAL LOW (ref 8.9–10.3)
Chloride: 102 mmol/L (ref 98–111)
Creatinine, Ser: 1.17 mg/dL — ABNORMAL HIGH (ref 0.44–1.00)
GFR calc Af Amer: 49 mL/min — ABNORMAL LOW (ref >60)
GFR calc non Af Amer: 42 mL/min — ABNORMAL LOW (ref >60)
Glucose, Bld: 103 mg/dL — ABNORMAL HIGH (ref 70–99)
Potassium: 3.5 mmol/L (ref 3.5–5.1)
Sodium: 138 mmol/L (ref 135–145)

## 2018-04-14 LAB — MRSA PCR SCREENING: MRSA by PCR: NEGATIVE

## 2018-04-14 SURGERY — FIXATION, FRACTURE, INTERTROCHANTERIC, WITH INTRAMEDULLARY ROD
Anesthesia: General | Laterality: Right

## 2018-04-14 MED ORDER — ROCURONIUM BROMIDE 10 MG/ML (PF) SYRINGE
PREFILLED_SYRINGE | INTRAVENOUS | Status: AC
  Filled 2018-04-14: qty 10

## 2018-04-14 MED ORDER — CHLORHEXIDINE GLUCONATE CLOTH 2 % EX PADS
6.0000 | MEDICATED_PAD | Freq: Every day | CUTANEOUS | Status: DC
  Administered 2018-04-14 – 2018-04-16 (×3): 6 via TOPICAL

## 2018-04-14 MED ORDER — CEFAZOLIN SODIUM-DEXTROSE 2-4 GM/100ML-% IV SOLN
2.0000 g | INTRAVENOUS | Status: AC
  Administered 2018-04-14: 2 g via INTRAVENOUS
  Filled 2018-04-14: qty 100

## 2018-04-14 MED ORDER — TRAMADOL HCL 50 MG PO TABS: 50.0000 mg | ORAL_TABLET | Freq: Four times a day (QID) | ORAL | Status: DC

## 2018-04-14 MED ORDER — TRAMADOL HCL 50 MG PO TABS: 50.0000 mg | ORAL_TABLET | Freq: Once | ORAL | Status: DC

## 2018-04-14 MED ORDER — PHENYLEPHRINE 40 MCG/ML (10ML) SYRINGE FOR IV PUSH (FOR BLOOD PRESSURE SUPPORT)
PREFILLED_SYRINGE | INTRAVENOUS | Status: AC
  Filled 2018-04-14: qty 10

## 2018-04-14 MED ORDER — FENTANYL CITRATE (PF) 100 MCG/2ML IJ SOLN
INTRAMUSCULAR | Status: DC | PRN
  Administered 2018-04-14: 50 ug via INTRAVENOUS
  Administered 2018-04-14 (×2): 25 ug via INTRAVENOUS

## 2018-04-14 MED ORDER — BUPIVACAINE-EPINEPHRINE (PF) 0.5% -1:200000 IJ SOLN
INTRAMUSCULAR | Status: AC
  Filled 2018-04-14: qty 60

## 2018-04-14 MED ORDER — ONDANSETRON HCL 4 MG/2ML IJ SOLN: 4.0000 mg | Freq: Four times a day (QID) | INTRAMUSCULAR | Status: DC | PRN

## 2018-04-14 MED ORDER — ROCURONIUM BROMIDE 100 MG/10ML IV SOLN
INTRAVENOUS | Status: DC | PRN
  Administered 2018-04-14: 10 mg via INTRAVENOUS
  Administered 2018-04-14: 20 mg via INTRAVENOUS

## 2018-04-14 MED ORDER — SUCCINYLCHOLINE CHLORIDE 20 MG/ML IJ SOLN
INTRAMUSCULAR | Status: DC | PRN
  Administered 2018-04-14: 100 mg via INTRAVENOUS

## 2018-04-14 MED ORDER — POVIDONE-IODINE 10 % EX SWAB
2.0000 "application " | Freq: Once | CUTANEOUS | Status: DC
  Filled 2018-04-14: qty 2

## 2018-04-14 MED ORDER — ONDANSETRON HCL 4 MG PO TABS: 4.0000 mg | ORAL_TABLET | Freq: Four times a day (QID) | ORAL | Status: DC | PRN

## 2018-04-14 MED ORDER — FENTANYL CITRATE (PF) 250 MCG/5ML IJ SOLN
INTRAMUSCULAR | Status: AC
  Filled 2018-04-14: qty 5

## 2018-04-14 MED ORDER — SUCCINYLCHOLINE CHLORIDE 200 MG/10ML IV SOSY
PREFILLED_SYRINGE | INTRAVENOUS | Status: AC
  Filled 2018-04-14: qty 10

## 2018-04-14 MED ORDER — BUPIVACAINE-EPINEPHRINE (PF) 0.5% -1:200000 IJ SOLN
INTRAMUSCULAR | Status: DC | PRN
  Administered 2018-04-14 (×2): 30 mL via PERINEURAL

## 2018-04-14 MED ORDER — ACETAMINOPHEN 10 MG/ML IV SOLN
1000.0000 mg | Freq: Once | INTRAVENOUS | Status: AC
  Administered 2018-04-14: 1000 mg via INTRAVENOUS
  Filled 2018-04-14: qty 100

## 2018-04-14 MED ORDER — PROPOFOL 10 MG/ML IV BOLUS
INTRAVENOUS | Status: AC
  Filled 2018-04-14: qty 20

## 2018-04-14 MED ORDER — CHLORHEXIDINE GLUCONATE CLOTH 2 % EX PADS
6.0000 | MEDICATED_PAD | Freq: Once | CUTANEOUS | Status: AC
  Administered 2018-04-13: 6 via TOPICAL

## 2018-04-14 MED ORDER — PROMETHAZINE HCL 25 MG/ML IJ SOLN: 6.2500 mg | INTRAMUSCULAR | Status: DC | PRN

## 2018-04-14 MED ORDER — METOCLOPRAMIDE HCL 10 MG PO TABS: 5.0000 mg | ORAL_TABLET | Freq: Three times a day (TID) | ORAL | Status: DC | PRN

## 2018-04-14 MED ORDER — ASPIRIN EC 325 MG PO TBEC
325.0000 mg | DELAYED_RELEASE_TABLET | Freq: Every day | ORAL | Status: DC
  Administered 2018-04-15 – 2018-04-16 (×2): 325 mg via ORAL
  Filled 2018-04-14 (×2): qty 1

## 2018-04-14 MED ORDER — CHLORHEXIDINE GLUCONATE 4 % EX LIQD
60.0000 mL | Freq: Once | CUTANEOUS | Status: AC
  Administered 2018-04-14: 4 via TOPICAL
  Filled 2018-04-14: qty 60

## 2018-04-14 MED ORDER — PHENOL 1.4 % MT LIQD: 1.0000 | OROMUCOSAL | Status: DC | PRN

## 2018-04-14 MED ORDER — HYDROMORPHONE HCL 1 MG/ML IJ SOLN: 0.2500 mg | INTRAMUSCULAR | Status: DC | PRN

## 2018-04-14 MED ORDER — SODIUM CHLORIDE 0.9 % IR SOLN
Status: DC | PRN
  Administered 2018-04-14: 1000 mL

## 2018-04-14 MED ORDER — LIDOCAINE 2% (20 MG/ML) 5 ML SYRINGE
INTRAMUSCULAR | Status: AC
  Filled 2018-04-14: qty 5

## 2018-04-14 MED ORDER — ONDANSETRON HCL 4 MG/2ML IJ SOLN
INTRAMUSCULAR | Status: DC | PRN
  Administered 2018-04-14: 4 mg via INTRAVENOUS

## 2018-04-14 MED ORDER — SUGAMMADEX SODIUM 500 MG/5ML IV SOLN
INTRAVENOUS | Status: DC | PRN
  Administered 2018-04-14: 109.8 mg via INTRAVENOUS

## 2018-04-14 MED ORDER — ONDANSETRON HCL 4 MG/2ML IJ SOLN
INTRAMUSCULAR | Status: AC
  Filled 2018-04-14: qty 2

## 2018-04-14 MED ORDER — CEFAZOLIN SODIUM-DEXTROSE 2-4 GM/100ML-% IV SOLN
2.0000 g | Freq: Four times a day (QID) | INTRAVENOUS | Status: AC
  Administered 2018-04-14 (×2): 2 g via INTRAVENOUS
  Filled 2018-04-14 (×2): qty 100

## 2018-04-14 MED ORDER — TRAMADOL HCL 50 MG PO TABS
50.0000 mg | ORAL_TABLET | Freq: Two times a day (BID) | ORAL | Status: DC
  Administered 2018-04-14 – 2018-04-16 (×4): 50 mg via ORAL
  Filled 2018-04-14 (×4): qty 1

## 2018-04-14 MED ORDER — MIDAZOLAM HCL 2 MG/2ML IJ SOLN: 0.5000 mg | Freq: Once | INTRAMUSCULAR | Status: DC | PRN

## 2018-04-14 MED ORDER — PROPOFOL 10 MG/ML IV BOLUS
INTRAVENOUS | Status: DC | PRN
  Administered 2018-04-14: 40 mg via INTRAVENOUS
  Administered 2018-04-14: 20 mg via INTRAVENOUS
  Administered 2018-04-14: 40 mg via INTRAVENOUS
  Administered 2018-04-14: 100 mg via INTRAVENOUS

## 2018-04-14 MED ORDER — EPHEDRINE 5 MG/ML INJ
INTRAVENOUS | Status: AC
  Filled 2018-04-14: qty 10

## 2018-04-14 MED ORDER — MENTHOL 3 MG MT LOZG: 1.0000 | LOZENGE | OROMUCOSAL | Status: DC | PRN

## 2018-04-14 MED ORDER — METOCLOPRAMIDE HCL 5 MG/ML IJ SOLN: 5.0000 mg | Freq: Three times a day (TID) | INTRAMUSCULAR | Status: DC | PRN

## 2018-04-14 MED ORDER — DEXAMETHASONE SODIUM PHOSPHATE 10 MG/ML IJ SOLN
INTRAMUSCULAR | Status: DC | PRN
  Administered 2018-04-14: 4 mg via INTRAVENOUS

## 2018-04-14 MED ORDER — LACTATED RINGERS IV SOLN
INTRAVENOUS | Status: DC
  Administered 2018-04-14: 11:00:00 via INTRAVENOUS

## 2018-04-14 MED ORDER — DOCUSATE SODIUM 100 MG PO CAPS
100.0000 mg | ORAL_CAPSULE | Freq: Two times a day (BID) | ORAL | Status: DC
  Administered 2018-04-14 – 2018-04-16 (×5): 100 mg via ORAL
  Filled 2018-04-14 (×5): qty 1

## 2018-04-14 SURGICAL SUPPLY — 59 items
BIT DRILL AO GAMMA 4.2X300 (BIT) ×2 IMPLANT
BLADE HEX COATED 2.75 (ELECTRODE) ×2 IMPLANT
BLADE SURG SZ10 CARB STEEL (BLADE) ×4 IMPLANT
BNDG GAUZE ELAST 4 BULKY (GAUZE/BANDAGES/DRESSINGS) ×2 IMPLANT
CHLORAPREP W/TINT 26ML (MISCELLANEOUS) ×2 IMPLANT
CLOTH BEACON ORANGE TIMEOUT ST (SAFETY) ×2 IMPLANT
COVER LIGHT HANDLE STERIS (MISCELLANEOUS) ×4 IMPLANT
COVER MAYO STAND XLG (DRAPE) ×2 IMPLANT
DECANTER SPIKE VIAL GLASS SM (MISCELLANEOUS) ×4 IMPLANT
DRAPE STERI IOBAN 125X83 (DRAPES) ×2 IMPLANT
DRSG MEPILEX BORDER 4X12 (GAUZE/BANDAGES/DRESSINGS) ×2 IMPLANT
DRSG OPSITE POSTOP 4X10 (GAUZE/BANDAGES/DRESSINGS) ×1 IMPLANT
DRSG PAD ABDOMINAL 8X10 ST (GAUZE/BANDAGES/DRESSINGS) ×2 IMPLANT
ELECT REM PT RETURN 9FT ADLT (ELECTROSURGICAL) ×2
ELECTRODE REM PT RTRN 9FT ADLT (ELECTROSURGICAL) ×1 IMPLANT
GLOVE BIOGEL M 6.5 STRL (GLOVE) ×1 IMPLANT
GLOVE BIOGEL PI IND STRL 7.0 (GLOVE) ×2 IMPLANT
GLOVE BIOGEL PI IND STRL 7.5 (GLOVE) IMPLANT
GLOVE BIOGEL PI INDICATOR 7.0 (GLOVE) ×3
GLOVE BIOGEL PI INDICATOR 7.5 (GLOVE) ×1
GLOVE ECLIPSE 7.0 STRL STRAW (GLOVE) ×1 IMPLANT
GLOVE SKINSENSE NS SZ8.0 LF (GLOVE) ×1
GLOVE SKINSENSE STRL SZ8.0 LF (GLOVE) ×1 IMPLANT
GLOVE SS N UNI LF 8.5 STRL (GLOVE) ×2 IMPLANT
GOWN STRL REUS W/ TWL LRG LVL3 (GOWN DISPOSABLE) ×1 IMPLANT
GOWN STRL REUS W/TWL LRG LVL3 (GOWN DISPOSABLE) ×3 IMPLANT
GOWN STRL REUS W/TWL XL LVL3 (GOWN DISPOSABLE) ×2 IMPLANT
GUIDEROD T2 3X1000 (ROD) ×2 IMPLANT
INST SET MAJOR BONE (KITS) ×2 IMPLANT
K-WIRE  3.2X450M STR (WIRE) ×1
K-WIRE 3.2X450M STR (WIRE) ×1
KIT BLADEGUARD II DBL (SET/KITS/TRAYS/PACK) ×2 IMPLANT
KIT TURNOVER CYSTO (KITS) ×2 IMPLANT
KWIRE 3.2X450M STR (WIRE) ×1 IMPLANT
MANIFOLD NEPTUNE II (INSTRUMENTS) ×2 IMPLANT
MARKER SKIN DUAL TIP RULER LAB (MISCELLANEOUS) ×2 IMPLANT
NAIL TROCH GAMMA 11X18 (Nail) ×1 IMPLANT
NDL HYPO 21X1.5 SAFETY (NEEDLE) ×1 IMPLANT
NDL SPNL 18GX3.5 QUINCKE PK (NEEDLE) ×1 IMPLANT
NEEDLE HYPO 21X1.5 SAFETY (NEEDLE) ×2 IMPLANT
NEEDLE SPNL 18GX3.5 QUINCKE PK (NEEDLE) ×2 IMPLANT
NS IRRIG 1000ML POUR BTL (IV SOLUTION) ×2 IMPLANT
PACK BASIC III (CUSTOM PROCEDURE TRAY) ×1
PACK SRG BSC III STRL LF ECLPS (CUSTOM PROCEDURE TRAY) ×1 IMPLANT
PENCIL HANDSWITCHING (ELECTRODE) ×2 IMPLANT
SCREW LAG GAMMA 3 95MM (Screw) ×1 IMPLANT
SCREW LOCKING T2 F/T  5MMX35MM (Screw) ×1 IMPLANT
SCREW LOCKING T2 F/T 5MMX35MM (Screw) IMPLANT
SET BASIN LINEN APH (SET/KITS/TRAYS/PACK) ×2 IMPLANT
SLING ARM FOAM STRAP MED (SOFTGOODS) ×2 IMPLANT
SPONGE LAP 18X18 RF (DISPOSABLE) ×4 IMPLANT
STAPLER VISISTAT 35W (STAPLE) ×1 IMPLANT
SUT BRALON NAB BRD #1 30IN (SUTURE) ×2 IMPLANT
SUT MNCRL 0 VIOLET CTX 36 (SUTURE) ×1 IMPLANT
SUT MON AB 2-0 CT1 36 (SUTURE) ×2 IMPLANT
SUT MONOCRYL 0 CTX 36 (SUTURE) ×1
SYR 30ML LL (SYRINGE) ×2 IMPLANT
SYR BULB IRRIGATION 50ML (SYRINGE) ×4 IMPLANT
YANKAUER SUCT BULB TIP NO VENT (SUCTIONS) ×2 IMPLANT

## 2018-04-14 NOTE — H&P (View-Only) (Signed)
Reason for Consult: right hip fracture  Referring Physician: Dr Tina Kline is an 85 y.o. female.  HPI: 85 max assist ambulation, frequent falls recently, h/o seizure disorder, fell at nursing home; progressive dementia; c/o pain right hip. Pain is severe, non radiating and increases with movement it is  Associated with inability to weight bear   Past Medical History:  Diagnosis Date  . Anemia   . Dementia (Tina Kline)   . Hypertension   . Seizures (Tina Kline)     Past Surgical History:  Procedure Laterality Date  . APPENDECTOMY    . BIOPSY  06/22/2017   Procedure: BIOPSY;  Surgeon: Daneil Dolin, MD;  [facility];  Service: Endoscopy;;  gastric   . COLONOSCOPY N/A 06/26/2015   Dr. Oneida Alar: Single tubular adenoma removed.  No future colonoscopies for surveillance purposes given age.  . ESOPHAGEAL DILATION N/A 06/22/2017   Procedure: ESOPHAGEAL DILATION;  Surgeon: Daneil Dolin, MD;  [facility];  Service: Endoscopy;  Laterality: N/A;  . ESOPHAGOGASTRODUODENOSCOPY N/A 01/29/2016   Dr. Oneida Alar: proximal esophageal web s/p dilation, large hiatal hernia, gastritis  . ESOPHAGOGASTRODUODENOSCOPY N/A 03/08/2016   Dr. Oneida Alar: proximal esophagel web s/p dilation, gastritis, moderate hiatal hernia  . ESOPHAGOGASTRODUODENOSCOPY  06/2015   Dr. Oneida Alar: Large hiatal hernia, gastritis, proximal esophageal web status post dilation  . ESOPHAGOGASTRODUODENOSCOPY (EGD) WITH PROPOFOL N/A 06/22/2017   Procedure: ESOPHAGOGASTRODUODENOSCOPY (EGD) WITH PROPOFOL;  Surgeon: Daneil Dolin, MD;  [facility];  Service: Endoscopy;  Laterality: N/A;  . MOUTH SURGERY    . SAVORY DILATION N/A 01/29/2016   Procedure: SAVORY DILATION;  Surgeon: Danie Binder, MD;  [facility];  Service: Endoscopy;  Laterality: N/A;  . SAVORY DILATION N/A 03/08/2016   Procedure: SAVORY DILATION;  Surgeon: Danie Binder, MD;  [facility];  Service: Endoscopy;  Laterality:  N/A;    Family History  Problem Relation Age of Onset  . Breast cancer Daughter   . Pancreatic cancer Son   . Colon cancer Son     Social History:  reports that she has never smoked. She has never used smokeless tobacco. She reports that she does not drink alcohol or use drugs.  Allergies: No Known Allergies  Medications:  Meds ordered this encounter  Medications  . 0.9 %  sodium chloride infusion  . 0.9 %  sodium chloride infusion (Manually program via Guardrails IV Fluids)  . donepezil (ARICEPT) tablet 10 mg  . traZODone (DESYREL) tablet 25 mg  . ferrous sulfate tablet 325 mg  . levETIRAcetam (KEPPRA) 100 MG/ML solution 250 mg  . HYDROcodone-acetaminophen (NORCO/VICODIN) 5-325 MG per tablet 1-2 tablet  . morphine 2 MG/ML injection 0.5 mg  . DISCONTD: heparin injection 5,000 Units  . OR Linked Order Group   . methocarbamol (ROBAXIN) tablet 500 mg   . methocarbamol (ROBAXIN) 500 mg in dextrose 5 % 50 mL IVPB  . potassium chloride SA (K-DUR,KLOR-CON) CR tablet 20 mEq  . pneumococcal 23 valent vaccine (PNU-IMMUNE) injection 0.5 mL  . mupirocin ointment (BACTROBAN) 2 % 1 application  . Chlorhexidine Gluconate Cloth 2 % PADS 6 each  . Chlorhexidine Gluconate Cloth 2 % PADS 6 each     Results for orders placed or performed during the hospital encounter of 04/13/18 (from the past 48 hour(s))  Comprehensive metabolic panel     Status: Abnormal   Collection Time: 04/13/18  5:11 PM  Result Value Ref Range   Sodium  136 135 - 145 mmol/L   Potassium 3.4 (L) 3.5 - 5.1 mmol/L   Chloride 98 98 - 111 mmol/L   CO2 29 22 - 32 mmol/L   Glucose, Bld 112 (H) 70 - 99 mg/dL   BUN 15 8 - 23 mg/dL   Creatinine, Ser 1.19 (H) 0.44 - 1.00 mg/dL   Calcium 9.3 8.9 - 10.3 mg/dL   Total Protein 7.6 6.5 - 8.1 g/dL   Albumin 3.9 3.5 - 5.0 g/dL   AST 29 15 - 41 U/L   ALT 47 (H) 0 - 44 U/L   Alkaline Phosphatase 157 (H) 38 - 126 U/L   Total Bilirubin 0.5 0.3 - 1.2 mg/dL   GFR calc non Af Amer 41  (L) >60 mL/min   GFR calc Af Amer 48 (L) >60 mL/min   Anion gap 9 5 - 15    Comment: Performed at [facility], [address] [place], [place] [address]  CBC with Differential/Platelet     Status: Abnormal   Collection Time: 04/13/18  5:11 PM  Result Value Ref Range   WBC 9.2 4.0 - 10.5 K/uL   RBC 3.36 (L) 3.87 - 5.11 MIL/uL   Hemoglobin 10.2 (L) 12.0 - 15.0 g/dL   HCT 33.0 (L) 36.0 - 46.0 %   MCV 98.2 80.0 - 100.0 fL   MCH 30.4 26.0 - 34.0 pg   MCHC 30.9 30.0 - 36.0 g/dL   RDW 13.5 11.5 - 15.5 %   Platelets 251 150 - 400 K/uL   nRBC 0.0 0.0 - 0.2 %   Neutrophils Relative % 83 %   Neutro Abs 7.6 1.7 - 7.7 K/uL   Lymphocytes Relative 9 %   Lymphs Abs 0.8 0.7 - 4.0 K/uL   Monocytes Relative 7 %   Monocytes Absolute 0.7 0.1 - 1.0 K/uL   Eosinophils Relative 1 %   Eosinophils Absolute 0.1 0.0 - 0.5 K/uL   Basophils Relative 0 %   Basophils Absolute 0.0 0.0 - 0.1 K/uL   Immature Granulocytes 0 %   Abs Immature Granulocytes 0.04 0.00 - 0.07 K/uL    Comment: Performed at [facility], [address], [place], [place] [address]  Type and screen [facility]     Status: None (Preliminary result)   Collection Time: 04/13/18  9:07 PM  Result Value Ref Range   ABO/RH(D) B POS    Antibody Screen NEG    Sample Expiration      04/16/2018 Performed at [facility], [address], [place], [place] [address]    Unit Number [id number]    Blood Component Type RED CELLS,LR    Unit division 00    Status of Unit ALLOCATED    Transfusion Status OK TO TRANSFUSE    Crossmatch Result Compatible    Unit Number [id number]    Blood Component Type RED CELLS,LR    Unit division 00    Status of Unit ALLOCATED    Transfusion Status OK TO TRANSFUSE    Crossmatch Result Compatible    Unit Number [id number]    Blood Component Type RED CELLS,LR    Unit division 00    Status of Unit ALLOCATED    Transfusion Status OK TO TRANSFUSE    Crossmatch Result Compatible   Prepare RBC      Status: None   Collection Time: 04/13/18  9:07 PM  Result Value Ref Range   Order Confirmation      ORDER PROCESSED BY BLOOD BANK Performed  at [facility], [address], [place], [place] [address]   MRSA PCR Screening     Status: None   Collection Time: 04/13/18 10:24 PM  Result Value Ref Range   MRSA by PCR NEGATIVE NEGATIVE    Comment:        The GeneXpert MRSA Assay (FDA approved for NASAL specimens only), is one component of a comprehensive MRSA colonization surveillance program. It is not intended to diagnose MRSA infection nor to guide or monitor treatment for MRSA infections. Performed at [facility], [address] [place], [place] [address]   CBC     Status: Abnormal   Collection Time: 04/14/18  6:35 AM  Result Value Ref Range   WBC 5.5 4.0 - 10.5 K/uL   RBC 2.87 (L) 3.87 - 5.11 MIL/uL   Hemoglobin 8.6 (L) 12.0 - 15.0 g/dL   HCT 28.0 (L) 36.0 - 46.0 %   MCV 97.6 80.0 - 100.0 fL   MCH 30.0 26.0 - 34.0 pg   MCHC 30.7 30.0 - 36.0 g/dL   RDW 13.6 11.5 - 15.5 %   Platelets 196 150 - 400 K/uL   nRBC 0.0 0.0 - 0.2 %    Comment: Performed at [facility], [address], [place], [place] [address]  Basic metabolic panel     Status: Abnormal   Collection Time: 04/14/18  6:35 AM  Result Value Ref Range   Sodium 138 135 - 145 mmol/L   Potassium 3.5 3.5 - 5.1 mmol/L   Chloride 102 98 - 111 mmol/L   CO2 27 22 - 32 mmol/L   Glucose, Bld 103 (H) 70 - 99 mg/dL   BUN 16 8 - 23 mg/dL   Creatinine, Ser 1.17 (H) 0.44 - 1.00 mg/dL   Calcium 8.8 (L) 8.9 - 10.3 mg/dL   GFR calc non Af Amer 42 (L) >60 mL/min   GFR calc Af Amer 49 (L) >60 mL/min   Anion gap 9 5 - 15    Comment: Performed at [facility], [address] [place], [place] [address]    Dg Chest 1 View  Result Date: 04/13/2018 CLINICAL DATA:  Fall with femur fracture EXAM: CHEST  1 VIEW COMPARISON:  01/01/2018 FINDINGS: No acute opacity or pleural effusion. Normal cardiomediastinal silhouette. Moderate hiatal  hernia. IMPRESSION: No active disease.  Hiatal hernia Electronically Signed   By: Donavan Foil M.D.   On: 04/13/2018 17:29   Ct Head Wo Contrast  Result Date: 04/13/2018 CLINICAL DATA:  Per ED notes: Pt is from [facility]. Pt fell early this morning. Xray performed at SNF, and it showed a RT femur fx. Family stated that patient has not slept for two days. She has advanced dementia. Pt sleepy at this time. EXAM: CT HEAD WITHOUT CONTRAST TECHNIQUE: Contiguous axial images were obtained from the base of the skull through the vertex without intravenous contrast. COMPARISON:  01/01/2018, 06/21/2017, 06/20/2017 FINDINGS: Brain: Partially calcified extra-axial mass in the LEFT parietal region is stable in appearance compared to prior studies, measuring approximately 14 millimeters today. By a previous MRI, characterization by prior MRI it was consistent with meningioma. Small RIGHT frontal calcified meningioma is 5.6 centimeters. There is central and cortical atrophy. Periventricular white matter changes are consistent with small vessel disease. No acute infarct or intracranial hemorrhage. Vascular: There is atherosclerotic calcification of the internal carotid arteries. No hyperdense vessels. Skull: Normal. Negative for fracture or focal lesion. Sinuses/Orbits: Mild mucosal swelling of the paranasal sinuses. Other: None IMPRESSION: 1.  No evidence for acute intracranial abnormality. 2. Atrophy and small vessel disease. 3. Stable LEFT parietal and RIGHT frontal meningiomas. No mass effect or edema. Electronically Signed   By: Nolon Nations M.D.   On: 04/13/2018 16:43   Dg Knee Complete 4 Views Right  Result Date: 04/13/2018 CLINICAL DATA:  Fall this morning, right hip fracture EXAM: RIGHT KNEE - COMPLETE 4+ VIEW COMPARISON:  05/13/2013 right knee radiograph FINDINGS: Diffuse osteopenia. Trace suprapatellar right knee joint effusion. No right knee fracture or dislocation. Moderate tricompartmental right knee  osteoarthritis, unchanged. No suspicious focal osseous lesions. No radiopaque foreign body. IMPRESSION: Trace suprapatellar right knee joint effusion. No right knee fracture or dislocation. Diffuse osteopenia. Moderate tricompartmental right knee osteoarthritis. Electronically Signed   By: Ilona Sorrel M.D.   On: 04/13/2018 19:25   Dg Hips Bilat With Pelvis Min 5 Views  Result Date: 04/13/2018 CLINICAL DATA:  Fall EXAM: DG HIP (WITH OR WITHOUT PELVIS) 5+V BILAT COMPARISON:  None. FINDINGS: Multiple pelvic calcifications, could be uterine in etiology. Bowel gas obscures the sacrum. Pubic symphysis appears intact. No acute displaced fracture or malalignment at the left hip. Acute displaced and likely impacted fracture at the base of the femoral neck with extension into the trochanteric region. No femoral head dislocation. IMPRESSION: 1. Acute displaced and slightly impacted appearing fracture involving the base of the femoral neck and trochanteric region of the right femur. 2. No acute osseous abnormality of the left hip. 3. Multiple coarse calcifications in the pelvis are presumably uterine in etiology Electronically Signed   By: Donavan Foil M.D.   On: 04/13/2018 17:28    Review of Systems  Unable to perform ROS: Dementia   Blood pressure (!) 123/52, pulse 86, temperature 98.5 F (36.9 C), temperature source Oral, resp. rate 18, weight 54.9 kg, SpO2 98 %. Physical Exam  General: ectomorphic  Alert awake , orientation: person yes place no time no  Mood pleasant Affect flat  Gait: unable to walk   Right arm left arm and left leg  ALIGNMENT normal, no tenderness, no deformity  ROM  No contractures of the major joint  All major joints redcued and stable  Motor normal muscle tone no tremors   Right leg Tenderness proximal thigh  ROM, Hip stability: can not assess Motor no tremor   Neuro responds to painful stimuli, dementia prevents detailed assessment, babinskis normal  Vascular warm no  edema x 4 extremites Skin normal all 4 extremities  Lymph groin normal   Assessment/Plan: Right hip peritrochanteric fracture   ORIF RIGHT HIP WITH GAMMA NAIL  WE CAN TRANSFUSE AT THE TIME OF SURGERY IF NECESSARY.  Tina Kline 04/14/2018, 8:24 AM

## 2018-04-14 NOTE — Progress Notes (Signed)
PROGRESS NOTE    Tina Kline  KXF:[id number] DOB: 04-16-1929 DOA: 04/13/2018 PCP: [name], MD     Brief Narrative:  85 y.o. female,With history of dementia, seizure disorder on Keppra, who is currently residing at skilled nursing facility was brought to ED after patient had a fall.  Patient was brought to the ED and imaging studies showed right femoral neck fracture.  Patient denies passing out.  No chest pain or shortness of breath. No nausea vomiting or diarrhea. Patient does not remember exact event which led to fall.  Orthopedic surgery on board and planning for ORIF later today.  Assessment & Plan: 1-close fracture, right hip (HCC) -After fall at facility -Anticipated ORIF later today -Continue as needed analgesics -Patient will require skilled nursing facility for further care and rehabilitation once fracture is stabilized -Continue supportive care.  2-history of seizure disorder -Continue Keppra -Seizure precaution order in place -Diethylamine reports of seizure over 9 years per daughter's report at time of admission.  3-dementia without behavioral disturbances -Continue supportive care -Continue Aricept.  4-hypertension -Tinea holding Demadex for now -Blood pressure stable -Stable volume status.  5-insomnia -continue PRN tazodone  6-hx of iron def anemia: chronic -Currently stable -No signs of acute bleeding -Continue ferrous sulfate twice a day -Follow hemoglobin trend.  DVT prophylaxis:  Code Status: Full code Family Communication: No family at bedside. Disposition Plan: Anticipate discharge back to skilled nursing facility once fracture is stabilized.  Follow hip fracture protocol/pathway.  After surgery will have PT/OT evaluated patient.  Continue as needed analgesics.  Consultants:   Orthopedic surgery (Dr. Aline Brochure).  Procedures:   Anticipated ORIF later today (04/14/2018).  Antimicrobials:  Anti-infectives (From admission, onward)   Start     Dose/Rate Route Frequency Ordered Stop   04/14/18 1800  ceFAZolin (ANCEF) IVPB 2g/100 mL premix     2 g 200 mL/hr over 30 Minutes Intravenous Every 6 hours 04/14/18 1515 04/15/18 0559   04/14/18 0915  ceFAZolin (ANCEF) IVPB 2g/100 mL premix     2 g 200 mL/hr over 30 Minutes Intravenous On call to O.R. 04/14/18 0912 04/14/18 1048       Subjective: No chest pain, no nausea, no vomiting, no shortness of breath or palpitation.  Mild pain while attempting movements of her right hip.  Objective: Vitals:   04/14/18 1230 04/14/18 1233 04/14/18 1240 04/14/18 1417  BP: (!) 156/66  (!) 151/60 (!) 157/60  Pulse: 66 66 78 64  Resp: 18 19 19 20   Temp:   97.8 F (36.6 C) 98.1 F (36.7 C)  TempSrc:      SpO2: 100% 100% 99% 99%  Weight:        Intake/Output Summary (Last 24 hours) at 04/14/2018 1612 Last data filed at 04/14/2018 1516 Gross per 24 hour  Intake 2151.25 ml  Output 350 ml  Net 1801.25 ml   Filed Weights   04/13/18 1540 04/13/18 2300  Weight: 49.9 kg 54.9 kg    Examination: General exam: Alert, awake, oriented x 2; denies chest pain, no shortness of breath, no nausea vomiting.  Patient reports mild discomfort with movement in her right hip.  Resting comfortable while non mobile or non bearing weight. Respiratory system: Clear to auscultation. Respiratory effort normal. Cardiovascular system:RRR. No murmurs, rubs, gallops. Gastrointestinal system: Abdomen is nondistended, soft and nontender. No organomegaly or masses felt. Normal bowel sounds heard. Central nervous system: Alert and oriented. No focal neurological deficits. Extremities: No cyanosis or clubbing. Skin: No rashes, lesions or  ulcers Psychiatry: Judgement and insight appear normal. Mood & affect appropriate.     Data Reviewed: I have personally reviewed following labs and imaging studies  CBC: Recent Labs  Lab 04/13/18 1711 04/14/18 0635  WBC 9.2 5.5  NEUTROABS 7.6  --   HGB 10.2* 8.6*  HCT  33.0* 28.0*  MCV 98.2 97.6  PLT 251 322   Basic Metabolic Panel: Recent Labs  Lab 04/13/18 1711 04/14/18 0635  NA 136 138  K 3.4* 3.5  CL 98 102  CO2 29 27  GLUCOSE 112* 103*  BUN 15 16  CREATININE 1.19* 1.17*  CALCIUM 9.3 8.8*   GFR: Estimated Creatinine Clearance: 26 mL/min (A) (by C-G formula based on SCr of 1.17 mg/dL (H)).   Liver Function Tests: Recent Labs  Lab 04/13/18 1711  AST 29  ALT 47*  ALKPHOS 157*  BILITOT 0.5  PROT 7.6  ALBUMIN 3.9   Urine analysis:    Component Value Date/Time   COLORURINE YELLOW 01/01/2018 [place] 01/01/2018 1404   LABSPEC 1.010 01/01/2018 1404   PHURINE 5.0 01/01/2018 1404   GLUCOSEU NEGATIVE 01/01/2018 1404   HGBUR NEGATIVE 01/01/2018 1404   BILIRUBINUR NEGATIVE 01/01/2018 1404   KETONESUR NEGATIVE 01/01/2018 1404   PROTEINUR NEGATIVE 01/01/2018 1404   UROBILINOGEN 0.2 01/15/2013 0425   NITRITE NEGATIVE 01/01/2018 1404   LEUKOCYTESUR NEGATIVE 01/01/2018 1404    Recent Results (from the past 240 hour(s))  MRSA PCR Screening     Status: None   Collection Time: 04/13/18 10:24 PM  Result Value Ref Range Status   MRSA by PCR NEGATIVE NEGATIVE Final    Comment:        The GeneXpert MRSA Assay (FDA approved for NASAL specimens only), is one component of a comprehensive MRSA colonization surveillance program. It is not intended to diagnose MRSA infection nor to guide or monitor treatment for MRSA infections. Performed at [facility], [address], [place], [place] [address]       Radiology Studies: Dg Chest 1 View  Result Date: 04/13/2018 CLINICAL DATA:  Fall with femur fracture EXAM: CHEST  1 VIEW COMPARISON:  01/01/2018 FINDINGS: No acute opacity or pleural effusion. Normal cardiomediastinal silhouette. Moderate hiatal hernia. IMPRESSION: No active disease.  Hiatal hernia Electronically Signed   By: Donavan Foil M.D.   On: 04/13/2018 17:29   Ct Head Wo Contrast  Result Date:  04/13/2018 CLINICAL DATA:  Per ED notes: Pt is from [facility]. Pt fell early this morning. Xray performed at SNF, and it showed a RT femur fx. Family stated that patient has not slept for two days. She has advanced dementia. Pt sleepy at this time. EXAM: CT HEAD WITHOUT CONTRAST TECHNIQUE: Contiguous axial images were obtained from the base of the skull through the vertex without intravenous contrast. COMPARISON:  01/01/2018, 06/21/2017, 06/20/2017 FINDINGS: Brain: Partially calcified extra-axial mass in the LEFT parietal region is stable in appearance compared to prior studies, measuring approximately 14 millimeters today. By a previous MRI, characterization by prior MRI it was consistent with meningioma. Small RIGHT frontal calcified meningioma is 5.6 centimeters. There is central and cortical atrophy. Periventricular white matter changes are consistent with small vessel disease. No acute infarct or intracranial hemorrhage. Vascular: There is atherosclerotic calcification of the internal carotid arteries. No hyperdense vessels. Skull: Normal. Negative for fracture or focal lesion. Sinuses/Orbits: Mild mucosal swelling of the paranasal sinuses. Other: None IMPRESSION: 1. No evidence for acute intracranial abnormality. 2. Atrophy and small vessel disease.  3. Stable LEFT parietal and RIGHT frontal meningiomas. No mass effect or edema. Electronically Signed   By: Nolon Nations M.D.   On: 04/13/2018 16:43   Dg Knee Complete 4 Views Right  Result Date: 04/13/2018 CLINICAL DATA:  Fall this morning, right hip fracture EXAM: RIGHT KNEE - COMPLETE 4+ VIEW COMPARISON:  05/13/2013 right knee radiograph FINDINGS: Diffuse osteopenia. Trace suprapatellar right knee joint effusion. No right knee fracture or dislocation. Moderate tricompartmental right knee osteoarthritis, unchanged. No suspicious focal osseous lesions. No radiopaque foreign body. IMPRESSION: Trace suprapatellar right knee joint effusion. No right knee  fracture or dislocation. Diffuse osteopenia. Moderate tricompartmental right knee osteoarthritis. Electronically Signed   By: Ilona Sorrel M.D.   On: 04/13/2018 19:25   Dg Hip Operative Unilat W Or W/o Pelvis Right  Result Date: 04/14/2018 CLINICAL DATA:  Right hip fracture. EXAM: OPERATIVE right HIP (WITH PELVIS IF PERFORMED) 11 VIEWS TECHNIQUE: Fluoroscopic spot image(s) were submitted for interpretation post-operatively. COMPARISON:  04/13/2018 FINDINGS: multiple intraoperative views. These demonstrate placement of a pain and screw fixation device across the previously described proximal femur fracture. Alignment is anatomic and no hardware complication is identified. IMPRESSION: Intraoperative imaging of right proximal femur fixation. Electronically Signed   By: Abigail Miyamoto M.D.   On: 04/14/2018 14:16   Dg Hips Bilat With Pelvis Min 5 Views  Result Date: 04/13/2018 CLINICAL DATA:  Fall EXAM: DG HIP (WITH OR WITHOUT PELVIS) 5+V BILAT COMPARISON:  None. FINDINGS: Multiple pelvic calcifications, could be uterine in etiology. Bowel gas obscures the sacrum. Pubic symphysis appears intact. No acute displaced fracture or malalignment at the left hip. Acute displaced and likely impacted fracture at the base of the femoral neck with extension into the trochanteric region. No femoral head dislocation. IMPRESSION: 1. Acute displaced and slightly impacted appearing fracture involving the base of the femoral neck and trochanteric region of the right femur. 2. No acute osseous abnormality of the left hip. 3. Multiple coarse calcifications in the pelvis are presumably uterine in etiology Electronically Signed   By: Donavan Foil M.D.   On: 04/13/2018 17:28    Scheduled Meds: . [START ON 04/15/2018] aspirin EC  325 mg Oral Q breakfast  . Chlorhexidine Gluconate Cloth  6 each Topical Q0600  . docusate sodium  100 mg Oral BID  . donepezil  10 mg Oral QHS  . ferrous sulfate  325 mg Oral BID  . levETIRAcetam  250 mg  Oral BID  . mupirocin ointment  1 application Nasal BID  . pneumococcal 23 valent vaccine  0.5 mL Intramuscular Tomorrow-1000  . traMADol  50 mg Oral Q12H  . traZODone  25 mg Oral QHS   Continuous Infusions: . sodium chloride 75 mL/hr at 04/13/18 1715  .  ceFAZolin (ANCEF) IV    . methocarbamol (ROBAXIN) IV       LOS: 1 day    Time spent: 30 minutes   Barton Dubois, MD [facility] Pager [phone]  04/14/2018, 4:12 PM

## 2018-04-14 NOTE — Anesthesia Postprocedure Evaluation (Signed)
Anesthesia Post Note  Patient: Nile Riggs  Procedure(s) Performed: INTRAMEDULLARY (IM) NAIL INTERTROCHANTRIC (Right )  Patient location during evaluation: PACU Anesthesia Type: General Level of consciousness: awake and sedated Pain management: pain level controlled Vital Signs Assessment: post-procedure vital signs reviewed and stable Respiratory status: spontaneous breathing and nonlabored ventilation Cardiovascular status: blood pressure returned to baseline and stable Postop Assessment: no headache Anesthetic complications: no     Last Vitals:  Vitals:   04/14/18 1230 04/14/18 1233  BP: (!) 156/66   Pulse: 66 66  Resp: 18 19  Temp:    SpO2: 100% 100%    Last Pain:  Vitals:   04/14/18 1233  TempSrc:   PainSc: 0-No pain    LLE Motor Response: (P) Responds to commands;Purposeful movement (04/14/18 1233) LLE Sensation: (P) Full sensation;No pain (04/14/18 1233) RLE Motor Response: (P) Responds to commands;Purposeful movement (04/14/18 1233) RLE Sensation: (P) No pain;Full sensation (04/14/18 1233)      Lenice Llamas

## 2018-04-14 NOTE — Anesthesia Procedure Notes (Signed)
Procedure Name: Intubation Date/Time: 04/14/2018 10:54 AM Performed by: Lenice Llamas, MD Pre-anesthesia Checklist: Patient identified, Patient being monitored, Timeout performed, Emergency Drugs available and Suction available Patient Re-evaluated:Patient Re-evaluated prior to induction Oxygen Delivery Method: Circle System Utilized Preoxygenation: Pre-oxygenation with 100% oxygen Induction Type: IV induction and Rapid sequence Laryngoscope Size: Mac and 3 Grade View: Grade I Tube type: Oral Tube size: 6.0 mm Number of attempts: 1 Airway Equipment and Method: Stylet Placement Confirmation: ETT inserted through vocal cords under direct vision,  positive ETCO2 and breath sounds checked- equal and bilateral Secured at: 22 cm Tube secured with: Tape Dental Injury: Teeth and Oropharynx as per pre-operative assessment

## 2018-04-14 NOTE — Interval H&P Note (Signed)
History and Physical Interval Note:  04/14/2018 10:47 AM  Tina Kline  has presented today for surgery, with the diagnosis of right hip fracture pertrochanteric.  The various methods of treatment have been discussed with the patient and family. After consideration of risks, benefits and other options for treatment, the patient has consented to  Procedure(s) with comments: INTRAMEDULLARY (IM) NAIL INTERTROCHANTRIC (Right) - 1000 am as a surgical intervention.  The patient's history has been reviewed, patient examined, no change in status, stable for surgery.  I have reviewed the patient's chart and labs.  Questions were answered to the patient's satisfaction.     Arther Abbott

## 2018-04-14 NOTE — Anesthesia Preprocedure Evaluation (Signed)
Anesthesia Evaluation  Patient identified by MRN, date of birth, ID band Patient awake    Reviewed: Allergy & Precautions, NPO status , Patient's Chart, lab work & pertinent test results  Airway Mallampati: II  TM Distance: >3 FB Neck ROM: Full    Dental no notable dental hx. (+) Edentulous Upper, Edentulous Lower   Pulmonary neg pulmonary ROS,    Pulmonary exam normal breath sounds clear to auscultation       Cardiovascular hypertension, Pt. on medications negative cardio ROS Normal cardiovascular examI Rhythm:Regular Rate:Normal  Family states walks at times  Falls  Denies CP - EF normal 2019   Neuro/Psych Seizures -, Well Controlled,  Dementia 2 stable meningiomas present without mass effect Dementia  Unable to tell me where she is or why she is here  Pleasant  Family reports no Sz activity in months -on keppra   negative psych ROS   GI/Hepatic negative GI ROS, Neg liver ROS,   Endo/Other  negative endocrine ROS  Renal/GU Renal InsufficiencyRenal disease  negative genitourinary   Musculoskeletal  (+) Arthritis , Osteoarthritis,    Abdominal   Peds negative pediatric ROS (+)  Hematology negative hematology ROS (+) anemia ,   Anesthesia Other Findings   Reproductive/Obstetrics negative OB ROS                             Anesthesia Physical Anesthesia Plan  ASA: III  Anesthesia Plan: General   Post-op Pain Management:    Induction: Intravenous  PONV Risk Score and Plan:   Airway Management Planned: Oral ETT  Additional Equipment:   Intra-op Plan:   Post-operative Plan: Extubation in OR  Informed Consent: I have reviewed the patients History and Physical, chart, labs and discussed the procedure including the risks, benefits and alternatives for the proposed anesthesia with the patient or authorized representative who has indicated his/her understanding and acceptance.        Plan Discussed with: Surgeon  Anesthesia Plan Comments:         Anesthesia Quick Evaluation

## 2018-04-14 NOTE — Transfer of Care (Signed)
Immediate Anesthesia Transfer of Care Note  Patient: Tina Kline  Procedure(s) Performed: INTRAMEDULLARY (IM) NAIL INTERTROCHANTRIC (Right )  Patient Location: PACU  Anesthesia Type:General  Level of Consciousness: awake and alert   Airway & Oxygen Therapy: Patient Spontanous Breathing  Post-op Assessment: Report given to RN  Post vital signs: Reviewed and stable  Last Vitals:  Vitals Value Taken Time  BP 156/66 04/14/2018 12:30 PM  Temp 36.6 C 04/14/2018 12:19 PM  Pulse 66 04/14/2018 12:33 PM  Resp 19 04/14/2018 12:33 PM  SpO2 100 % 04/14/2018 12:33 PM    Last Pain:  Vitals:   04/14/18 1233  TempSrc:   PainSc: 0-No pain         Complications: No apparent anesthesia complications

## 2018-04-14 NOTE — Consult Note (Signed)
Reason for Consult: right hip fracture  Referring Physician: Dr [name] is an 85 y.o. female.  HPI: 38 max assist ambulation, frequent falls recently, h/o seizure disorder, fell at nursing home; progressive dementia; c/o pain right hip. Pain is severe, non radiating and increases with movement it is  Associated with inability to weight bear   Past Medical History:  Diagnosis Date  . Anemia   . Dementia ([place])   . Hypertension   . Seizures ([place])     Past Surgical History:  Procedure Laterality Date  . APPENDECTOMY    . BIOPSY  06/22/2017   Procedure: BIOPSY;  Surgeon: Daneil Dolin, MD;  [facility];  Service: Endoscopy;;  gastric   . COLONOSCOPY N/A 06/26/2015   Dr. Oneida Alar: Single tubular adenoma removed.  No future colonoscopies for surveillance purposes given age.  . ESOPHAGEAL DILATION N/A 06/22/2017   Procedure: ESOPHAGEAL DILATION;  Surgeon: Daneil Dolin, MD;  [facility];  Service: Endoscopy;  Laterality: N/A;  . ESOPHAGOGASTRODUODENOSCOPY N/A 01/29/2016   Dr. Oneida Alar: proximal esophageal web s/p dilation, large hiatal hernia, gastritis  . ESOPHAGOGASTRODUODENOSCOPY N/A 03/08/2016   Dr. Oneida Alar: proximal esophagel web s/p dilation, gastritis, moderate hiatal hernia  . ESOPHAGOGASTRODUODENOSCOPY  06/2015   Dr. Oneida Alar: Large hiatal hernia, gastritis, proximal esophageal web status post dilation  . ESOPHAGOGASTRODUODENOSCOPY (EGD) WITH PROPOFOL N/A 06/22/2017   Procedure: ESOPHAGOGASTRODUODENOSCOPY (EGD) WITH PROPOFOL;  Surgeon: Daneil Dolin, MD;  [facility];  Service: Endoscopy;  Laterality: N/A;  . MOUTH SURGERY    . SAVORY DILATION N/A 01/29/2016   Procedure: SAVORY DILATION;  Surgeon: Danie Binder, MD;  [facility];  Service: Endoscopy;  Laterality: N/A;  . SAVORY DILATION N/A 03/08/2016   Procedure: SAVORY DILATION;  Surgeon: Danie Binder, MD;  [facility];  Service: Endoscopy;  Laterality:  N/A;    Family History  Problem Relation Age of Onset  . Breast cancer Daughter   . Pancreatic cancer Son   . Colon cancer Son     Social History:  reports that she has never smoked. She has never used smokeless tobacco. She reports that she does not drink alcohol or use drugs.  Allergies: No Known Allergies  Medications:  Meds ordered this encounter  Medications  . 0.9 %  sodium chloride infusion  . 0.9 %  sodium chloride infusion (Manually program via Guardrails IV Fluids)  . donepezil (ARICEPT) tablet 10 mg  . traZODone (DESYREL) tablet 25 mg  . ferrous sulfate tablet 325 mg  . levETIRAcetam (KEPPRA) 100 MG/ML solution 250 mg  . HYDROcodone-acetaminophen (NORCO/VICODIN) 5-325 MG per tablet 1-2 tablet  . morphine 2 MG/ML injection 0.5 mg  . DISCONTD: heparin injection 5,000 Units  . OR Linked Order Group   . methocarbamol (ROBAXIN) tablet 500 mg   . methocarbamol (ROBAXIN) 500 mg in dextrose 5 % 50 mL IVPB  . potassium chloride SA (K-DUR,KLOR-CON) CR tablet 20 mEq  . pneumococcal 23 valent vaccine (PNU-IMMUNE) injection 0.5 mL  . mupirocin ointment (BACTROBAN) 2 % 1 application  . Chlorhexidine Gluconate Cloth 2 % PADS 6 each  . Chlorhexidine Gluconate Cloth 2 % PADS 6 each     Results for orders placed or performed during the hospital encounter of 04/13/18 (from the past 48 hour(s))  Comprehensive metabolic panel     Status: Abnormal   Collection Time: 04/13/18  5:11 PM  Result Value Ref Range   Sodium  136 135 - 145 mmol/L   Potassium 3.4 (L) 3.5 - 5.1 mmol/L   Chloride 98 98 - 111 mmol/L   CO2 29 22 - 32 mmol/L   Glucose, Bld 112 (H) 70 - 99 mg/dL   BUN 15 8 - 23 mg/dL   Creatinine, Ser 1.19 (H) 0.44 - 1.00 mg/dL   Calcium 9.3 8.9 - 10.3 mg/dL   Total Protein 7.6 6.5 - 8.1 g/dL   Albumin 3.9 3.5 - 5.0 g/dL   AST 29 15 - 41 U/L   ALT 47 (H) 0 - 44 U/L   Alkaline Phosphatase 157 (H) 38 - 126 U/L   Total Bilirubin 0.5 0.3 - 1.2 mg/dL   GFR calc non Af Amer 41  (L) >60 mL/min   GFR calc Af Amer 48 (L) >60 mL/min   Anion gap 9 5 - 15    Comment: Performed at [facility], [address], [place], [place] [address]  CBC with Differential/Platelet     Status: Abnormal   Collection Time: 04/13/18  5:11 PM  Result Value Ref Range   WBC 9.2 4.0 - 10.5 K/uL   RBC 3.36 (L) 3.87 - 5.11 MIL/uL   Hemoglobin 10.2 (L) 12.0 - 15.0 g/dL   HCT 33.0 (L) 36.0 - 46.0 %   MCV 98.2 80.0 - 100.0 fL   MCH 30.4 26.0 - 34.0 pg   MCHC 30.9 30.0 - 36.0 g/dL   RDW 13.5 11.5 - 15.5 %   Platelets 251 150 - 400 K/uL   nRBC 0.0 0.0 - 0.2 %   Neutrophils Relative % 83 %   Neutro Abs 7.6 1.7 - 7.7 K/uL   Lymphocytes Relative 9 %   Lymphs Abs 0.8 0.7 - 4.0 K/uL   Monocytes Relative 7 %   Monocytes Absolute 0.7 0.1 - 1.0 K/uL   Eosinophils Relative 1 %   Eosinophils Absolute 0.1 0.0 - 0.5 K/uL   Basophils Relative 0 %   Basophils Absolute 0.0 0.0 - 0.1 K/uL   Immature Granulocytes 0 %   Abs Immature Granulocytes 0.04 0.00 - 0.07 K/uL    Comment: Performed at [facility], [address] [place], [place] [address]  Type and screen [facility]     Status: None (Preliminary result)   Collection Time: 04/13/18  9:07 PM  Result Value Ref Range   ABO/RH(D) B POS    Antibody Screen NEG    Sample Expiration      04/16/2018 Performed at [facility], [address] [place], [place] [address]    Unit Number [id number]    Blood Component Type RED CELLS,LR    Unit division 00    Status of Unit ALLOCATED    Transfusion Status OK TO TRANSFUSE    Crossmatch Result Compatible    Unit Number [id number]    Blood Component Type RED CELLS,LR    Unit division 00    Status of Unit ALLOCATED    Transfusion Status OK TO TRANSFUSE    Crossmatch Result Compatible    Unit Number [id number]    Blood Component Type RED CELLS,LR    Unit division 00    Status of Unit ALLOCATED    Transfusion Status OK TO TRANSFUSE    Crossmatch Result Compatible   Prepare RBC      Status: None   Collection Time: 04/13/18  9:07 PM  Result Value Ref Range   Order Confirmation      ORDER PROCESSED BY BLOOD BANK Performed  at [facility], [address], [place], [place] [address]   MRSA PCR Screening     Status: None   Collection Time: 04/13/18 10:24 PM  Result Value Ref Range   MRSA by PCR NEGATIVE NEGATIVE    Comment:        The GeneXpert MRSA Assay (FDA approved for NASAL specimens only), is one component of a comprehensive MRSA colonization surveillance program. It is not intended to diagnose MRSA infection nor to guide or monitor treatment for MRSA infections. Performed at [facility], [address] [place], [place] [address]   CBC     Status: Abnormal   Collection Time: 04/14/18  6:35 AM  Result Value Ref Range   WBC 5.5 4.0 - 10.5 K/uL   RBC 2.87 (L) 3.87 - 5.11 MIL/uL   Hemoglobin 8.6 (L) 12.0 - 15.0 g/dL   HCT 28.0 (L) 36.0 - 46.0 %   MCV 97.6 80.0 - 100.0 fL   MCH 30.0 26.0 - 34.0 pg   MCHC 30.7 30.0 - 36.0 g/dL   RDW 13.6 11.5 - 15.5 %   Platelets 196 150 - 400 K/uL   nRBC 0.0 0.0 - 0.2 %    Comment: Performed at [facility], [address] [place], [place] [address]  Basic metabolic panel     Status: Abnormal   Collection Time: 04/14/18  6:35 AM  Result Value Ref Range   Sodium 138 135 - 145 mmol/L   Potassium 3.5 3.5 - 5.1 mmol/L   Chloride 102 98 - 111 mmol/L   CO2 27 22 - 32 mmol/L   Glucose, Bld 103 (H) 70 - 99 mg/dL   BUN 16 8 - 23 mg/dL   Creatinine, Ser 1.17 (H) 0.44 - 1.00 mg/dL   Calcium 8.8 (L) 8.9 - 10.3 mg/dL   GFR calc non Af Amer 42 (L) >60 mL/min   GFR calc Af Amer 49 (L) >60 mL/min   Anion gap 9 5 - 15    Comment: Performed at [facility], [address], [place],  [address]    Dg Chest 1 View  Result Date: 04/13/2018 CLINICAL DATA:  Fall with femur fracture EXAM: CHEST  1 VIEW COMPARISON:  01/01/2018 FINDINGS: No acute opacity or pleural effusion. Normal cardiomediastinal silhouette. Moderate hiatal  hernia. IMPRESSION: No active disease.  Hiatal hernia Electronically Signed   By: Donavan Foil M.D.   On: 04/13/2018 17:29   Ct Head Wo Contrast  Result Date: 04/13/2018 CLINICAL DATA:  Per ED notes: Pt is [facility]. Pt fell early this morning. Xray performed at SNF, and it showed a RT femur fx. Family stated that patient has not slept for two days. She has advanced dementia. Pt sleepy at this time. EXAM: CT HEAD WITHOUT CONTRAST TECHNIQUE: Contiguous axial images were obtained from the base of the skull through the vertex without intravenous contrast. COMPARISON:  01/01/2018, 06/21/2017, 06/20/2017 FINDINGS: Brain: Partially calcified extra-axial mass in the LEFT parietal region is stable in appearance compared to prior studies, measuring approximately 14 millimeters today. By a previous MRI, characterization by prior MRI it was consistent with meningioma. Small RIGHT frontal calcified meningioma is 5.6 centimeters. There is central and cortical atrophy. Periventricular white matter changes are consistent with small vessel disease. No acute infarct or intracranial hemorrhage. Vascular: There is atherosclerotic calcification of the internal carotid arteries. No hyperdense vessels. Skull: Normal. Negative for fracture or focal lesion. Sinuses/Orbits: Mild mucosal swelling of the paranasal sinuses. Other: None IMPRESSION: 1.  No evidence for acute intracranial abnormality. 2. Atrophy and small vessel disease. 3. Stable LEFT parietal and RIGHT frontal meningiomas. No mass effect or edema. Electronically Signed   By: Nolon Nations M.D.   On: 04/13/2018 16:43   Dg Knee Complete 4 Views Right  Result Date: 04/13/2018 CLINICAL DATA:  Fall this morning, right hip fracture EXAM: RIGHT KNEE - COMPLETE 4+ VIEW COMPARISON:  05/13/2013 right knee radiograph FINDINGS: Diffuse osteopenia. Trace suprapatellar right knee joint effusion. No right knee fracture or dislocation. Moderate tricompartmental right knee  osteoarthritis, unchanged. No suspicious focal osseous lesions. No radiopaque foreign body. IMPRESSION: Trace suprapatellar right knee joint effusion. No right knee fracture or dislocation. Diffuse osteopenia. Moderate tricompartmental right knee osteoarthritis. Electronically Signed   By: Ilona Sorrel M.D.   On: 04/13/2018 19:25   Dg Hips Bilat With Pelvis Min 5 Views  Result Date: 04/13/2018 CLINICAL DATA:  Fall EXAM: DG HIP (WITH OR WITHOUT PELVIS) 5+V BILAT COMPARISON:  None. FINDINGS: Multiple pelvic calcifications, could be uterine in etiology. Bowel gas obscures the sacrum. Pubic symphysis appears intact. No acute displaced fracture or malalignment at the left hip. Acute displaced and likely impacted fracture at the base of the femoral neck with extension into the trochanteric region. No femoral head dislocation. IMPRESSION: 1. Acute displaced and slightly impacted appearing fracture involving the base of the femoral neck and trochanteric region of the right femur. 2. No acute osseous abnormality of the left hip. 3. Multiple coarse calcifications in the pelvis are presumably uterine in etiology Electronically Signed   By: Donavan Foil M.D.   On: 04/13/2018 17:28    Review of Systems  Unable to perform ROS: Dementia   Blood pressure (!) 123/52, pulse 86, temperature 98.5 F (36.9 C), temperature source Oral, resp. rate 18, weight 54.9 kg, SpO2 98 %. Physical Exam  General: ectomorphic  Alert awake , orientation: person yes place no time no  Mood pleasant Affect flat  Gait: unable to walk   Right arm left arm and left leg  ALIGNMENT normal, no tenderness, no deformity  ROM  No contractures of the major joint  All major joints redcued and stable  Motor normal muscle tone no tremors   Right leg Tenderness proximal thigh  ROM, Hip stability: can not assess Motor no tremor   Neuro responds to painful stimuli, dementia prevents detailed assessment, babinskis normal  Vascular warm no  edema x 4 extremites Skin normal all 4 extremities  Lymph groin normal   Assessment/Plan: Right hip peritrochanteric fracture   ORIF RIGHT HIP WITH GAMMA NAIL  WE CAN TRANSFUSE AT THE TIME OF SURGERY IF NECESSARY.  Arther Abbott 04/14/2018, 8:24 AM

## 2018-04-14 NOTE — Op Note (Signed)
04/14/2018  12:25 PM  PATIENT:  Tina Kline  85 y.o. female  PRE-OPERATIVE DIAGNOSIS:  right hip fracture pertrochanteric  POST-OPERATIVE DIAGNOSIS:  * No post-op diagnosis entered *  PROCEDURE:  Procedure(s) with comments: INTRAMEDULLARY (IM) NAIL INTERTROCHANTRIC (Right) - 1000 am - [id number]  SURGEON:  Surgeon(s) and Role:    Carole Civil, MD - Primary  Open treatment internal fixation of the hip with gamma nail  Implant Stryker 125 degree gamma nail with 95 mm lag screw 35 mm distal locking screw proximal acorn and sliding mode  Date of surgery April 14, 2018  Time of dictation 12:27 PM  Preop diagnosis right hip fracture intertrochanteric  Postoperative diagnosis same Procedure open treatment internal fixation of the right hip Surgeon [place] Anesthesia General Surgical findings 2 part peritrochanteric/intertrochanteric fracture equivalent right hip  Local anesthetic quarter percent Marcaine 30 cc no epinephrine  The surgery was done as follows The patient was identified in the preop area the surgical site was confirmed and marked after thorough chart review including radiographs  The patient was brought back to the operating room for general anesthesia and then was placed on the fracture table. The operative leg was placed in traction the nonoperative leg was padded and placed in a boot and abducted  The C-arm was brought in and a closed reduction was performed with the C-arm.  Multiplane x-rays were taken and the leg was manipulated until a stable reduction was obtained using traction to obtain proper neck shaft angle and then rotation to correct rotational malalignment  The leg was prepped and draped with ChloraPrep. This was followed by timeout which confirmed as surgical site, implants, x-ray gowns and badges.  The incision was made over the right hip at the greater trochanter proximally, subcutaneous tissue was divided down to the fascial layer  which was split in line with the skin and incision.  This was followed by blunt dissection with a finger down to the tip of the trochanter  The sharp tipped awl was passed down to the level lesser stroke followed by insertion of a guidewire down to the knee.  X-ray confirmed position of the guidewire and proximal reamer was performed down to the level of the lesser trochanter.  We then passed 125 degree nail  Second incision was made subtenons tissue was divided muscle and fascia were split down to bone   After correction for rotational position a drill was used to breech the cortex of the lateral femur followed by insertion of a threaded guidewire which was placed in the center of the femoral head on AP and lateral x-ray  We measured the pin distance at 95 set the reamer at 95 and passed the reamer over the guidewire.  Lag screw was placed proximal portion of the nail was irrigated and acorn was placed in sliding mode confirmation was performed by toggling on the lag screw screwdriver handle  We next use the external guide to place a third incision and then passed a cannula with a sharp tip followed by drilling with a 4.2 drill bit and a 5.0  35 mm locking screw.  Final images confirmed reduction of the fracture hardware position.  The wound was irrigated with copious amounts of saline and closed in layered fashion starting with 0 Monocryl, #1 Bralon, 0 Monocryl and staples. We used 60 mL of Marcaine with epinephrine dividing it between each layer  A sterile bandage was applied  Postoperative plan Weightbearing as tolerated Staples can  be removed postop day 12-14 Anticoagulation for 28 days Follow-up visit at 2 weeks for x-rays and then x-rays at 6 weeks and 12 weeks [id number]  SURGEON:  Surgeon(s) and Role:    * Carole Civil, MD - Primary   PHYSICIAN ASSISTANT:   ASSISTANTS: none   ANESTHESIA:   general  EBL:  50 mL   BLOOD ADMINISTERED:none  DRAINS: none   LOCAL  MEDICATIONS USED:  MARCAINE     SPECIMEN:  No Specimen  DISPOSITION OF SPECIMEN:  N/A  COUNTS:  YES  TOURNIQUET:  * No tourniquets in log *  DICTATION: .Dragon Dictation  PLAN OF CARE: Admit to inpatient   PATIENT DISPOSITION:  PACU - hemodynamically stable.   Delay start of Pharmacological VTE agent (>24hrs) due to surgical blood loss or risk of bleeding: no

## 2018-04-14 NOTE — Brief Op Note (Signed)
04/14/2018  12:25 PM  PATIENT:  Tina Kline  85 y.o. female  PRE-OPERATIVE DIAGNOSIS:  right hip fracture pertrochanteric  POST-OPERATIVE DIAGNOSIS:  * No post-op diagnosis entered *  PROCEDURE:  Procedure(s) with comments: INTRAMEDULLARY (IM) NAIL INTERTROCHANTRIC (Right) - 1000 am - [id number]  SURGEON:  Surgeon(s) and Role:    Carole Civil, MD - Primary  Open treatment internal fixation of the hip with gamma nail  Implant Stryker 125 degree gamma nail with 95 mm lag screw 35 mm distal locking screw proximal acorn and sliding mode  Date of surgery April 14, 2018  Time of dictation 12:27 PM  Preop diagnosis right hip fracture intertrochanteric  Postoperative diagnosis same Procedure open treatment internal fixation of the right hip Surgeon [place] Anesthesia General Surgical findings 2 part peritrochanteric/intertrochanteric fracture equivalent right hip  Local anesthetic quarter percent Marcaine 30 cc no epinephrine  The surgery was done as follows The patient was identified in the preop area the surgical site was confirmed and marked after thorough chart review including radiographs  The patient was brought back to the operating room for general anesthesia and then was placed on the fracture table. The operative leg was placed in traction the nonoperative leg was padded and placed in a boot and abducted  The C-arm was brought in and a closed reduction was performed with the C-arm.  Multiplane x-rays were taken and the leg was manipulated until a stable reduction was obtained using traction to obtain proper neck shaft angle and then rotation to correct rotational malalignment  The leg was prepped and draped with ChloraPrep. This was followed by timeout which confirmed as surgical site, implants, x-ray gowns and badges.  The incision was made over the right hip at the greater trochanter proximally, subcutaneous tissue was divided down to the fascial layer  which was split in line with the skin and incision.  This was followed by blunt dissection with a finger down to the tip of the trochanter  The sharp tipped awl was passed down to the level lesser stroke followed by insertion of a guidewire down to the knee.  X-ray confirmed position of the guidewire and proximal reamer was performed down to the level of the lesser trochanter.  We then passed 125 degree nail  Second incision was made subtenons tissue was divided muscle and fascia were split down to bone   After correction for rotational position a drill was used to breech the cortex of the lateral femur followed by insertion of a threaded guidewire which was placed in the center of the femoral head on AP and lateral x-ray  We measured the pin distance at 95 set the reamer at 95 and passed the reamer over the guidewire.  Lag screw was placed proximal portion of the nail was irrigated and acorn was placed in sliding mode confirmation was performed by toggling on the lag screw screwdriver handle  We next use the external guide to place a third incision and then passed a cannula with a sharp tip followed by drilling with a 4.2 drill bit and a 5.0  35 mm locking screw.  Final images confirmed reduction of the fracture hardware position.  The wound was irrigated with copious amounts of saline and closed in layered fashion starting with 0 Monocryl, #1 Bralon, 0 Monocryl and staples. We used 60 mL of Marcaine with epinephrine dividing it between each layer  A sterile bandage was applied  Postoperative plan Weightbearing as tolerated Staples can  be removed postop day 12-14 Anticoagulation for 28 days Follow-up visit at 2 weeks for x-rays and then x-rays at 6 weeks and 12 weeks [id number]  SURGEON:  Surgeon(s) and Role:    * Carole Civil, MD - Primary   PHYSICIAN ASSISTANT:   ASSISTANTS: none   ANESTHESIA:   general  EBL:  50 mL   BLOOD ADMINISTERED:none  DRAINS: none   LOCAL  MEDICATIONS USED:  MARCAINE     SPECIMEN:  No Specimen  DISPOSITION OF SPECIMEN:  N/A  COUNTS:  YES  TOURNIQUET:  * No tourniquets in log *  DICTATION: .Dragon Dictation  PLAN OF CARE: Admit to inpatient   PATIENT DISPOSITION:  PACU - hemodynamically stable.   Delay start of Pharmacological VTE agent (>24hrs) due to surgical blood loss or risk of bleeding: no

## 2018-04-15 LAB — CBC
HCT: 24.1 % — ABNORMAL LOW (ref 36.0–46.0)
HEMOGLOBIN: 7.4 g/dL — AB (ref 12.0–15.0)
MCH: 30 pg (ref 26.0–34.0)
MCHC: 30.7 g/dL (ref 30.0–36.0)
MCV: 97.6 fL (ref 80.0–100.0)
Platelets: 172 10*3/uL (ref 150–400)
RBC: 2.47 MIL/uL — ABNORMAL LOW (ref 3.87–5.11)
RDW: 13.5 % (ref 11.5–15.5)
WBC: 10.9 10*3/uL — ABNORMAL HIGH (ref 4.0–10.5)
nRBC: 0 % (ref 0.0–0.2)

## 2018-04-15 LAB — BASIC METABOLIC PANEL
Anion gap: 10 (ref 5–15)
BUN: 28 mg/dL — ABNORMAL HIGH (ref 8–23)
CO2: 25 mmol/L (ref 22–32)
Calcium: 8.5 mg/dL — ABNORMAL LOW (ref 8.9–10.3)
Chloride: 105 mmol/L (ref 98–111)
Creatinine, Ser: 1.37 mg/dL — ABNORMAL HIGH (ref 0.44–1.00)
GFR calc Af Amer: 40 mL/min — ABNORMAL LOW (ref >60)
GFR calc non Af Amer: 35 mL/min — ABNORMAL LOW (ref >60)
GLUCOSE: 99 mg/dL (ref 70–99)
Potassium: 4.1 mmol/L (ref 3.5–5.1)
Sodium: 140 mmol/L (ref 135–145)

## 2018-04-15 MED ORDER — FUROSEMIDE 10 MG/ML IJ SOLN
20.0000 mg | Freq: Once | INTRAMUSCULAR | Status: AC
  Administered 2018-04-15: 20 mg via INTRAVENOUS
  Filled 2018-04-15: qty 2

## 2018-04-15 MED ORDER — POLYETHYLENE GLYCOL 3350 17 G PO PACK
17.0000 g | PACK | Freq: Every day | ORAL | Status: DC
  Administered 2018-04-15 – 2018-04-16 (×2): 17 g via ORAL
  Filled 2018-04-15 (×2): qty 1

## 2018-04-15 MED ORDER — HYDROCODONE-ACETAMINOPHEN 5-325 MG PO TABS: 1.0000 | ORAL_TABLET | Freq: Four times a day (QID) | ORAL | Status: DC | PRN

## 2018-04-15 MED ORDER — SODIUM CHLORIDE 0.9% IV SOLUTION
Freq: Once | INTRAVENOUS | Status: AC
  Administered 2018-04-15: 14:00:00 via INTRAVENOUS

## 2018-04-15 MED ORDER — FERROUS SULFATE 220 (44 FE) MG/5ML PO ELIX
220.0000 mg | ORAL_SOLUTION | Freq: Three times a day (TID) | ORAL | Status: DC
  Filled 2018-04-15 (×2): qty 5

## 2018-04-15 MED ORDER — MORPHINE SULFATE (PF) 2 MG/ML IV SOLN: 0.5000 mg | Freq: Three times a day (TID) | INTRAVENOUS | Status: DC | PRN

## 2018-04-15 NOTE — Evaluation (Signed)
Physical Therapy Evaluation Patient Details Name: Tina Kline MRN: [id number] DOB: [birth year] Today's Date: 04/15/2018   History of Present Illness  Tina Kline  is a 85 y.o. female,With history of dementia, seizure disorder on Keppra, who is currently residing at skilled nursing facility was brought to ED after patient had a fall.  Patient was brought to the ED and imaging studies showed right femoral neck fracture.  Patient denies passing out.  No chest pain or shortness of breath. Patient POD #1 Procedure open treatment internal fixation w/ gamma nail of the right hip. WBAT. HgB 7.4 with transfusion ordered.    Clinical Impression  Patient is s/p above surgery resulting in functional limitations due to the deficits listed below (see PT Problem List). Patient lying in bed when arrived. Patient responded to name. Patient reported she had no pain in supine. Patient required mod to max assistance for bed mobility, transfers and short distance ambulation with RW and assistance to weight shift to move feet in standing. Patient limited to short shuffling steps due to pain with weight bearing and active/passive movement of the Rt LE. Patient transferred to recliner with chair alarm set. PT reported to nursing; nursing in room when PT exited. Patient will benefit from skilled PT to increase their independence and safety with mobility to allow discharge to the venue listed below.       Follow Up Recommendations SNF;Supervision/Assistance - 24 hour;Supervision for mobility/OOB    Equipment Recommendations  None recommended by PT    Recommendations for Other Services       Precautions / Restrictions Precautions Precautions: Fall Restrictions Weight Bearing Restrictions: Yes RLE Weight Bearing: Weight bearing as tolerated      Mobility  Bed Mobility Overal bed mobility: Needs Assistance Bed Mobility: Supine to Sit     Supine to sit: Mod assist;Max assist;HOB elevated     General  bed mobility comments: painful with movement  Transfers Overall transfer level: Needs assistance Equipment used: Rolling walker (2 wheeled) Transfers: Sit to/from [facility] Sit to Stand: Mod assist;Max assist Stand pivot transfers: Mod assist;Max assist       General transfer comment: painful with weight bearing  Ambulation/Gait Ambulation/Gait assistance: Mod assist;Max assist Gait Distance (Feet): 4 Feet Assistive device: Rolling walker (2 wheeled) Gait Pattern/deviations: Step-to pattern;Shuffle;Decreased step length - right;Decreased step length - left;Decreased stride length;Decreased stance time - right;Decreased weight shift to right;Antalgic;Trunk flexed Gait velocity: decreased   General Gait Details: slow labored cadence; limited to 5-6 steps at bedside due to complaints of pain, decreased weight bearing through Rt LE and fatigue.  Stairs            Wheelchair Mobility    Modified Rankin (Stroke Patients Only)       Balance Overall balance assessment: Needs assistance Sitting-balance support: Bilateral upper extremity supported;Feet supported Sitting balance-Leahy Scale: Fair Sitting balance - Comments: poor initial upon sitting from supine   Standing balance support: Bilateral upper extremity supported;During functional activity Standing balance-Leahy Scale: Poor Standing balance comment: w/ RW                             Pertinent Vitals/Pain Pain Assessment: Faces Faces Pain Scale: Hurts a little bit Pain Location: pain increases with active or passive movement Pain Intervention(s): Limited activity within patient's tolerance;Monitored during session;Repositioned;Premedicated before session    Home Living Family/patient expects to be discharged to:: Skilled nursing facility  Prior Function Level of Independence: Needs assistance;Independent with assistive device(s)   Gait / Transfers  Assistance Needed: unable to ascertain whether used assistive device or not at time of evaluation.           Hand Dominance        Extremity/Trunk Assessment   Upper Extremity Assessment Upper Extremity Assessment: Generalized weakness    Lower Extremity Assessment Lower Extremity Assessment: RLE deficits/detail;Generalized weakness RLE Deficits / Details: Rt hip ORIF RLE: Unable to fully assess due to pain       Communication   Communication: No difficulties(Patient appears to rely on sight with left eye)  Cognition     Overall Cognitive Status: No family/caregiver present to determine baseline cognitive functioning                                 General Comments: history of dementia; uncertain if at baseline currently.      General Comments      Exercises     Assessment/Plan    PT Assessment Patient needs continued PT services  PT Problem List Decreased strength;Decreased mobility;Decreased safety awareness;Decreased activity tolerance;Decreased balance;Pain;Decreased knowledge of use of DME       PT Treatment Interventions DME instruction;Therapeutic activities;Cognitive remediation;Gait training;Therapeutic exercise;Patient/family education;Balance training;Neuromuscular re-education;Manual techniques    PT Goals (Current goals can be found in the Care Plan section)  Acute Rehab PT Goals Patient Stated Goal: unsure Time For Goal Achievement: 04/29/18 Potential to Achieve Goals: Fair    Frequency Min 5X/week   Barriers to discharge        Co-evaluation               AM-PAC PT "6 Clicks" Mobility  Outcome Measure Help needed turning from your back to your side while in a flat bed without using bedrails?: A Lot Help needed moving from lying on your back to sitting on the side of a flat bed without using bedrails?: A Lot Help needed moving to and from a bed to a chair (including a wheelchair)?: A Lot Help needed standing up from  a chair using your arms (e.g., wheelchair or bedside chair)?: A Lot Help needed to walk in hospital room?: A Lot Help needed climbing 3-5 steps with a railing? : Total 6 Click Score: 11    End of Session Equipment Utilized During Treatment: Gait belt Activity Tolerance: Patient limited by pain;Patient tolerated treatment well;Patient limited by fatigue Patient left: with nursing/sitter in room;in chair;with chair alarm set;with call bell/phone within reach Nurse Communication: Mobility status PT Visit Diagnosis: Unsteadiness on feet (R26.81);Other abnormalities of gait and mobility (R26.89);Muscle weakness (generalized) (M62.81);Difficulty in walking, not elsewhere classified (R26.2)    Time: 5277-8242 PT Time Calculation (min) (ACUTE ONLY): 30 min   Charges:     PT Treatments $Therapeutic Activity: 8-22 mins        Floria Raveling. Hartnett-Rands, MS, PT Per [place] [phone] 04/15/2018, 1:20 PM

## 2018-04-15 NOTE — Plan of Care (Signed)
  Problem: Acute Rehab PT Goals(only PT should resolve) Goal: Pt will Roll Supine to Side Outcome: Progressing Flowsheets (Taken 04/15/2018 1325) Pt will Roll Supine to Side: with mod assist Goal: Pt Will Go Supine/Side To Sit Outcome: Progressing Flowsheets (Taken 04/15/2018 1325) Pt will go Supine/Side to Sit: with moderate assist; with HOB elevated Goal: Patient Will Transfer Sit To/From Stand Outcome: Progressing Flowsheets (Taken 04/15/2018 1325) Patient will transfer sit to/from stand: with moderate assist Goal: Pt Will Transfer Bed To Chair/Chair To Bed Outcome: Progressing Flowsheets (Taken 04/15/2018 1325) Pt will Transfer Bed to Chair/Chair to Bed: with mod assist Goal: Pt Will Ambulate Outcome: Progressing Flowsheets (Taken 04/15/2018 1325) Pt will Ambulate: 15 feet; with moderate assist; with least restrictive assistive device    Pamala Hurry D. Hartnett-Rands, MS, PT Per [place] [phone] 04/15/2018

## 2018-04-15 NOTE — Clinical Social Work Note (Addendum)
Clinical Social Work Assessment  Patient Details  Name: Tina Kline MRN: [id number] Date of Birth: [birth year]  Date of referral:  04/15/18               Reason for consult:  Other (Comment Required)(From [facility])                Permission sought to share information with:  Facility Sport and exercise psychologist, Family Supports Permission granted to share information::  Yes, Verbal Permission Granted  Name::     Tina Kline -daughter [phone] other daughter Tina Kline [phone]  Agency::  [facility]  Relationship::     Contact Information:     Housing/Transportation Living arrangements for the past 2 months:  [place] of Information:  Medical Team Patient Interpreter Needed:  None Criminal Activity/Legal Involvement Pertinent to Current Situation/Hospitalization:  No - Comment as needed Significant Relationships:  Adult Children Lives with:  Facility Resident Do you feel safe going back to the place where you live?  Yes Need for family participation in patient care:  Yes (Comment)  Care giving concerns:  TBD   Social Worker assessment / plan: LCSW reviewed patient information from medical notes to start Fl2 and and this assessment and Passr number. As per PT notes the patient would benfit from PT at [facility]. Patient is from [place]. Handoff provided to weekday SW to follow this patient. Patient requires assistance with ADLs and uses a walker-Read PT notes.Villa Burgin  is a 85 y.o. female,With history of dementia, seizure disorder on Keppra, who is currently residing at skilled nursing facility was brought to ED after patient had a fall.  Patient was brought to the ED and imaging studies showed right femoral neck fracture.  Patient denies passing out.  No chest pain or shortness of breath. Patient POD #1 Procedure open treatment internal fixation w/ gamma nail of the right hip. WBAT. HgB 7.4 with transfusion ordered.   Employment status:   Retired Forensic scientist:   Medicare/medicaid PT Recommendations:  Skilled Nursing Facility(PT x5 ) Information / Referral to community resources:    None Patient/Family's Response to care: TBD  Patient/Family's Understanding of and Emotional Response to Diagnosis, Current Treatment, and Prognosis: TBD  Emotional Assessment Appearance:  Appears stated age Attitude/Demeanor/Rapport:  Gracious(LCSW did not assess in person based on medical team notes) Affect (typically observed):  Calm(LCSW did not assess patient in person based on medical team notes) Orientation:  Oriented to Self, Oriented to Place Alcohol / Substance use:  Not Applicable Psych involvement (Current and /or in the community):  No (Comment)  Discharge Needs  Concerns to be addressed:  No discharge needs identified Readmission within the last 30 days:  No Current discharge risk:  None Barriers to Discharge:  Continued Medical Work up   [place], [place], LCSW 04/15/2018, 2:44 PM

## 2018-04-15 NOTE — Progress Notes (Signed)
PROGRESS NOTE    DARRELL LEONHARDT  OYD:[id number] DOB: Feb 16, 1929 DOA: 04/13/2018 PCP: [name], MD     Brief Narrative:  85 y.o. female,With history of dementia, seizure disorder on Keppra, who is currently residing at skilled nursing facility was brought to ED after patient had a fall.  Patient was brought to the ED and imaging studies showed right femoral neck fracture.  Patient denies passing out.  No chest pain or shortness of breath. No nausea vomiting or diarrhea. Patient does not remember exact event which led to fall.  Orthopedic surgery on board and planning for ORIF later today.  Assessment & Plan: 1-close fracture, right hip (HCC) -Status post ORIF on 04/14/2018 -Continue as needed analgesics -Follow-up postsurgical recommendations by orthopedic service. -Patient will require skilled nursing facility for further care and rehabilitation once fracture is stabilized -Continue supportive care.  2-history of seizure disorder -Continue Keppra -Seizure precaution order in place -Diethylamine reports of seizure over 9 years per daughter's report at time of admission.  3-dementia without behavioral disturbances -Continue supportive care -Continue Aricept.  4-hypertension -Tinea holding Demadex for now -Blood pressure stable -Stable volume status.  5-insomnia -continue PRN tazodone  6-prior hx of iron def anemia: chronic; now with acute component after surgery most likely suggesting blood loss anemia and hemodilution. -Transfuse 1 unit of PRBCs -Continue ferrous sulfate twice a day -Follow hemoglobin trend.   DVT prophylaxis:  Code Status: Full code Family Communication: No family at bedside. Disposition Plan: Anticipate discharge back to skilled nursing facility tomorrow.  Transfuse 1 unit PRBCs to stabilize hemoglobin.  Continue as needed analgesics.  PT/OT evaluation pending.  Consultants:   Orthopedic surgery (Dr. Aline Brochure).  Procedures:   Anticipated  ORIF later today (04/14/2018).  Antimicrobials:  Anti-infectives (From admission, onward)   Start     Dose/Rate Route Frequency Ordered Stop   04/14/18 1800  ceFAZolin (ANCEF) IVPB 2g/100 mL premix     2 g 200 mL/hr over 30 Minutes Intravenous Every 6 hours 04/14/18 1515 04/15/18 0007   04/14/18 0915  ceFAZolin (ANCEF) IVPB 2g/100 mL premix     2 g 200 mL/hr over 30 Minutes Intravenous On call to O.R. 04/14/18 0912 04/14/18 1048       Subjective: No chest pain, no nausea, no vomiting, no shortness of breath or abdominal pain.  Patient reports pain is well controlled and is in no acute distress.  Objective: Vitals:   04/14/18 1417 04/14/18 2000 04/15/18 0111 04/15/18 0500  BP: (!) 157/60 131/60 (!) 127/55 (!) 118/56  Pulse: 64 81 87 81  Resp: 20 20 18 16   Temp: 98.1 F (36.7 C) 97.6 F (36.4 C) 98.6 F (37 C) (!) 97.2 F (36.2 C)  TempSrc:  Oral Oral Axillary  SpO2: 99% 100% 99% 100%  Weight:    56.2 kg    Intake/Output Summary (Last 24 hours) at 04/15/2018 0848 Last data filed at 04/15/2018 0500 Gross per 24 hour  Intake 1468.75 ml  Output 675 ml  Net 793.75 ml   Filed Weights   04/13/18 1540 04/13/18 2300 04/15/18 0500  Weight: 49.9 kg 54.9 kg 56.2 kg    Examination: General exam: Alert, awake, oriented x 2; afebrile and in no major distress.  Patient reports feeling weak. Respiratory system: Clear to auscultation. Respiratory effort normal. Cardiovascular system:RRR. No murmurs, rubs, gallops. Gastrointestinal system: Abdomen is nondistended, soft and nontender. No organomegaly or masses felt. Normal bowel sounds heard. Central nervous system: Alert and oriented. No focal neurological deficits.  Extremities: No cyanosis or clubbing.  Decreased range of motion right lower extremity due to recent fracture/surgery. Skin: No rashes, no petechiae. Psychiatry: Mood & affect appropriate.    Data Reviewed: I have personally reviewed following labs and imaging  studies  CBC: Recent Labs  Lab 04/13/18 1711 04/14/18 0635 04/15/18 0705  WBC 9.2 5.5 10.9*  NEUTROABS 7.6  --   --   HGB 10.2* 8.6* 7.4*  HCT 33.0* 28.0* 24.1*  MCV 98.2 97.6 97.6  PLT 251 196 774   Basic Metabolic Panel: Recent Labs  Lab 04/13/18 1711 04/14/18 0635 04/15/18 0705  NA 136 138 140  K 3.4* 3.5 4.1  CL 98 102 105  CO2 29 27 25   GLUCOSE 112* 103* 99  BUN 15 16 28*  CREATININE 1.19* 1.17* 1.37*  CALCIUM 9.3 8.8* 8.5*   GFR: Estimated Creatinine Clearance: 22.2 mL/min (A) (by C-G formula based on SCr of 1.37 mg/dL (H)).   Liver Function Tests: Recent Labs  Lab 04/13/18 1711  AST 29  ALT 47*  ALKPHOS 157*  BILITOT 0.5  PROT 7.6  ALBUMIN 3.9   Urine analysis:    Component Value Date/Time   COLORURINE YELLOW 01/01/2018 [place] 01/01/2018 1404   LABSPEC 1.010 01/01/2018 1404   PHURINE 5.0 01/01/2018 1404   GLUCOSEU NEGATIVE 01/01/2018 1404   HGBUR NEGATIVE 01/01/2018 1404   BILIRUBINUR NEGATIVE 01/01/2018 1404   KETONESUR NEGATIVE 01/01/2018 1404   PROTEINUR NEGATIVE 01/01/2018 1404   UROBILINOGEN 0.2 01/15/2013 0425   NITRITE NEGATIVE 01/01/2018 1404   LEUKOCYTESUR NEGATIVE 01/01/2018 1404    Recent Results (from the past 240 hour(s))  MRSA PCR Screening     Status: None   Collection Time: 04/13/18 10:24 PM  Result Value Ref Range Status   MRSA by PCR NEGATIVE NEGATIVE Final    Comment:        The GeneXpert MRSA Assay (FDA approved for NASAL specimens only), is one component of a comprehensive MRSA colonization surveillance program. It is not intended to diagnose MRSA infection nor to guide or monitor treatment for MRSA infections. Performed at [facility], [address] [place], [place] [address]       Radiology Studies: Dg Chest 1 View  Result Date: 04/13/2018 CLINICAL DATA:  Fall with femur fracture EXAM: CHEST  1 VIEW COMPARISON:  01/01/2018 FINDINGS: No acute opacity or pleural effusion. Normal  cardiomediastinal silhouette. Moderate hiatal hernia. IMPRESSION: No active disease.  Hiatal hernia Electronically Signed   By: Donavan Foil M.D.   On: 04/13/2018 17:29   Ct Head Wo Contrast  Result Date: 04/13/2018 CLINICAL DATA:  Per ED notes: Pt is from [facility]. Pt fell early this morning. Xray performed at SNF, and it showed a RT femur fx. Family stated that patient has not slept for two days. She has advanced dementia. Pt sleepy at this time. EXAM: CT HEAD WITHOUT CONTRAST TECHNIQUE: Contiguous axial images were obtained from the base of the skull through the vertex without intravenous contrast. COMPARISON:  01/01/2018, 06/21/2017, 06/20/2017 FINDINGS: Brain: Partially calcified extra-axial mass in the LEFT parietal region is stable in appearance compared to prior studies, measuring approximately 14 millimeters today. By a previous MRI, characterization by prior MRI it was consistent with meningioma. Small RIGHT frontal calcified meningioma is 5.6 centimeters. There is central and cortical atrophy. Periventricular white matter changes are consistent with small vessel disease. No acute infarct or intracranial hemorrhage. Vascular: There is atherosclerotic calcification of the internal carotid arteries.  No hyperdense vessels. Skull: Normal. Negative for fracture or focal lesion. Sinuses/Orbits: Mild mucosal swelling of the paranasal sinuses. Other: None IMPRESSION: 1. No evidence for acute intracranial abnormality. 2. Atrophy and small vessel disease. 3. Stable LEFT parietal and RIGHT frontal meningiomas. No mass effect or edema. Electronically Signed   By: Nolon Nations M.D.   On: 04/13/2018 16:43   Dg Knee Complete 4 Views Right  Result Date: 04/13/2018 CLINICAL DATA:  Fall this morning, right hip fracture EXAM: RIGHT KNEE - COMPLETE 4+ VIEW COMPARISON:  05/13/2013 right knee radiograph FINDINGS: Diffuse osteopenia. Trace suprapatellar right knee joint effusion. No right knee fracture or  dislocation. Moderate tricompartmental right knee osteoarthritis, unchanged. No suspicious focal osseous lesions. No radiopaque foreign body. IMPRESSION: Trace suprapatellar right knee joint effusion. No right knee fracture or dislocation. Diffuse osteopenia. Moderate tricompartmental right knee osteoarthritis. Electronically Signed   By: Ilona Sorrel M.D.   On: 04/13/2018 19:25   Dg Hip Operative Unilat W Or W/o Pelvis Right  Result Date: 04/14/2018 CLINICAL DATA:  Right hip fracture. EXAM: OPERATIVE right HIP (WITH PELVIS IF PERFORMED) 11 VIEWS TECHNIQUE: Fluoroscopic spot image(s) were submitted for interpretation post-operatively. COMPARISON:  04/13/2018 FINDINGS: multiple intraoperative views. These demonstrate placement of a pain and screw fixation device across the previously described proximal femur fracture. Alignment is anatomic and no hardware complication is identified. IMPRESSION: Intraoperative imaging of right proximal femur fixation. Electronically Signed   By: Abigail Miyamoto M.D.   On: 04/14/2018 14:16   Dg Hips Bilat With Pelvis Min 5 Views  Result Date: 04/13/2018 CLINICAL DATA:  Fall EXAM: DG HIP (WITH OR WITHOUT PELVIS) 5+V BILAT COMPARISON:  None. FINDINGS: Multiple pelvic calcifications, could be uterine in etiology. Bowel gas obscures the sacrum. Pubic symphysis appears intact. No acute displaced fracture or malalignment at the left hip. Acute displaced and likely impacted fracture at the base of the femoral neck with extension into the trochanteric region. No femoral head dislocation. IMPRESSION: 1. Acute displaced and slightly impacted appearing fracture involving the base of the femoral neck and trochanteric region of the right femur. 2. No acute osseous abnormality of the left hip. 3. Multiple coarse calcifications in the pelvis are presumably uterine in etiology Electronically Signed   By: Donavan Foil M.D.   On: 04/13/2018 17:28    Scheduled Meds: . sodium chloride    Intravenous Once  . aspirin EC  325 mg Oral Q breakfast  . Chlorhexidine Gluconate Cloth  6 each Topical Q0600  . docusate sodium  100 mg Oral BID  . donepezil  10 mg Oral QHS  . ferrous sulfate  325 mg Oral BID  . furosemide  20 mg Intravenous Once  . levETIRAcetam  250 mg Oral BID  . mupirocin ointment  1 application Nasal BID  . pneumococcal 23 valent vaccine  0.5 mL Intramuscular Tomorrow-1000  . polyethylene glycol  17 g Oral Daily  . traMADol  50 mg Oral Q12H  . traZODone  25 mg Oral QHS   Continuous Infusions: . methocarbamol (ROBAXIN) IV       LOS: 2 days    Time spent: 30 minutes   Barton Dubois, MD [facility] Pager [phone]  04/15/2018, 8:48 AM

## 2018-04-15 NOTE — Progress Notes (Signed)
Patient ID: Tina Kline, female   DOB: July 25, 1929, 85 y.o.   MRN: [id number]  Postop day 1 status post open treatment internal fixation right hip with gamma nail  -BP (!) 118/56 (BP Location: Right Arm)   Pulse 81   Temp (!) 97.2 F (36.2 C) (Axillary)   Resp 16   Wt 56.2 kg   SpO2 100%   BMI 23.41 kg/m   Vital signs look good  Patient was in bed lying flat toying with her bed sheets but seemed coherent and oriented to person and place and situation.  CBC Latest Ref Rng & Units 04/15/2018 04/14/2018 04/13/2018  WBC 4.0 - 10.5 K/uL 10.9(H) 5.5 9.2  Hemoglobin 12.0 - 15.0 g/dL 7.4(L) 8.6(L) 10.2(L)  Hematocrit 36.0 - 46.0 % 24.1(L) 28.0(L) 33.0(L)  Platelets 150 - 400 K/uL 172 196 251    BMP Latest Ref Rng & Units 04/14/2018 04/13/2018 01/03/2018  Glucose 70 - 99 mg/dL 103(H) 112(H) 93  BUN 8 - 23 mg/dL 16 15 41(H)  Creatinine 0.44 - 1.00 mg/dL 1.17(H) 1.19(H) 1.76(H)  Sodium 135 - 145 mmol/L 138 136 134(L)  Potassium 3.5 - 5.1 mmol/L 3.5 3.4(L) 5.0  Chloride 98 - 111 mmol/L 102 98 110  CO2 22 - 32 mmol/L 27 29 20(L)  Calcium 8.9 - 10.3 mg/dL 8.8(L) 9.3 8.8(L)    Acute blood loss anemia with stable blood pressure and pulse  At this time I would not transfuse the patient.  The hemoglobin of 10.2 on March 6 was most likely volume contracted secondary to chronic volume contraction.  Monitor hemoglobin start iron with stool softener

## 2018-04-15 NOTE — NC FL2 (Signed)
[place] MEDICAID FL2 LEVEL OF CARE SCREENING TOOL     IDENTIFICATION  Patient Name: Tina Kline Birthdate: September 02, 1929 Sex: female Admission Date (Current Location): 04/13/2018  [facility] and [place] Number:  [employer] and Address:  [facility] [address], [place]      Provider Number: [id number]  Attending Physician Name and Address:  Barton Dubois, MD  Relative Name and Phone Number:   Rayna Sexton [phone] daughter [name] [phone] daughter    Current Level of Care: Hospital Recommended Level of Care: [place] Prior Approval Number:    Date Approved/Denied:   PASRR Number: [id number]  Discharge Plan: SNF    Current Diagnoses: Patient Active Problem List   Diagnosis Date Noted  . Closed fracture of right hip ([place])   . Femur fracture, right ([place]) 04/13/2018  . Protein-calorie malnutrition, severe 01/03/2018  . Essential hypertension   . Failure to thrive in adult 01/02/2018  . Acute renal failure superimposed on stage 3 chronic kidney disease ([place]) 01/02/2018  . Goals of care, counseling/discussion 01/02/2018  . Dehydration   . Hyponatremia   . Syncope and collapse   . Palliative care by specialist   . Dementia ([place]) 01/01/2018  . H. pylori infection 09/27/2017  . Dark stools 09/27/2017  . Syncope 06/20/2017  . Anemia   . Dysphagia 01/20/2016  . IDA (iron deficiency anemia) 06/08/2015  . Chronic renal disease 06/08/2015  . Arthritis of knee, degenerative 06/13/2013    Orientation RESPIRATION BLADDER Height & Weight     Self, Situation  Normal Incontinent Weight: 123 lb 14.4 oz (56.2 kg) Height:     BEHAVIORAL SYMPTOMS/MOOD NEUROLOGICAL BOWEL NUTRITION STATUS      Incontinent    AMBULATORY STATUS COMMUNICATION OF NEEDS Skin   Limited Assist Verbally Normal                       Personal Care Assistance Level of Assistance  Bathing, Feeding, Dressing, Total care  Bathing Assistance: Limited assistance Feeding assistance: Independent Dressing Assistance: Limited assistance Total Care Assistance: Limited assistance   Functional Limitations Info  Sight, Hearing, Speech Sight Info: (uses left eye to see) Hearing Info: Adequate Speech Info: Adequate    SPECIAL CARE FACTORS FREQUENCY  PT (By licensed PT), OT (By licensed OT)     PT Frequency: X5 OT Frequency: X5            Contractures Contractures Info: Not present    Additional Factors Info  Code Status Code Status Info: Full code             Current Medications (04/15/2018):  This is the current hospital active medication list Current Facility-Administered Medications  Medication Dose Route Frequency Provider Last Rate Last Dose  . aspirin EC tablet 325 mg  325 mg Oral Q breakfast Carole Civil, MD   325 mg at 04/15/18 0857  . Chlorhexidine Gluconate Cloth 2 % PADS 6 each  6 each Topical Q0600 Carole Civil, MD   6 each at 04/15/18 0550  . docusate sodium (COLACE) capsule 100 mg  100 mg Oral BID Carole Civil, MD   100 mg at 04/15/18 0857  . donepezil (ARICEPT) tablet 10 mg  10 mg Oral QHS Carole Civil, MD   10 mg at 04/14/18 2138  . ferrous sulfate tablet 325 mg  325 mg Oral BID Carole Civil, MD   325 mg at 04/15/18 0857  .  furosemide (LASIX) injection 20 mg  20 mg Intravenous Once Barton Dubois, MD      . levETIRAcetam (KEPPRA) 100 MG/ML solution 250 mg  250 mg Oral BID Carole Civil, MD   250 mg at 04/15/18 0858  . menthol-cetylpyridinium (CEPACOL) lozenge 3 mg  1 lozenge Oral PRN Carole Civil, MD       Or  . phenol (CHLORASEPTIC) mouth spray 1 spray  1 spray Mouth/Throat PRN Carole Civil, MD      . methocarbamol (ROBAXIN) tablet 500 mg  500 mg Oral Q6H PRN Carole Civil, MD   500 mg at 04/15/18 0858   Or  . methocarbamol (ROBAXIN) 500 mg in dextrose 5 % 50 mL IVPB  500 mg Intravenous Q6H PRN Carole Civil, MD       . metoCLOPramide (REGLAN) tablet 5-10 mg  5-10 mg Oral Q8H PRN Carole Civil, MD       Or  . metoCLOPramide (REGLAN) injection 5-10 mg  5-10 mg Intravenous Q8H PRN Carole Civil, MD      . morphine 2 MG/ML injection 0.5 mg  0.5 mg Intravenous Q2H PRN Carole Civil, MD   0.5 mg at 04/15/18 7824  . mupirocin ointment (BACTROBAN) 2 % 1 application  1 application Nasal BID Carole Civil, MD   1 application at 23/53/61 [phone]  . ondansetron (ZOFRAN) tablet 4 mg  4 mg Oral Q6H PRN Carole Civil, MD       Or  . ondansetron [facility]) injection 4 mg  4 mg Intravenous Q6H PRN Carole Civil, MD      . pneumococcal 23 valent vaccine (PNU-IMMUNE) injection 0.5 mL  0.5 mL Intramuscular Tomorrow-1000 Carole Civil, MD      . polyethylene glycol (MIRALAX / GLYCOLAX) packet 17 g  17 g Oral Daily Carole Civil, MD   17 g at 04/15/18 0914  . traMADol (ULTRAM) tablet 50 mg  50 mg Oral Q12H Carole Civil, MD   50 mg at 04/15/18 0401  . traZODone (DESYREL) tablet 25 mg  25 mg Oral QHS Carole Civil, MD   25 mg at 04/14/18 2138     Discharge Medications: Please see discharge summary for a list of discharge medications.  Relevant Imaging Results:  Relevant Lab Results:   Additional Information SSN [id number], [place], [place]

## 2018-04-16 ENCOUNTER — Encounter (HOSPITAL_COMMUNITY): Payer: Self-pay | Admitting: Orthopedic Surgery

## 2018-04-16 DIAGNOSIS — R262 Difficulty in walking, not elsewhere classified: Secondary | ICD-10-CM | POA: Diagnosis present

## 2018-04-16 DIAGNOSIS — E860 Dehydration: Secondary | ICD-10-CM | POA: Diagnosis present

## 2018-04-16 DIAGNOSIS — R1312 Dysphagia, oropharyngeal phase: Secondary | ICD-10-CM | POA: Diagnosis present

## 2018-04-16 DIAGNOSIS — R609 Edema, unspecified: Secondary | ICD-10-CM | POA: Diagnosis present

## 2018-04-16 DIAGNOSIS — R569 Unspecified convulsions: Secondary | ICD-10-CM | POA: Diagnosis present

## 2018-04-16 DIAGNOSIS — E871 Hypo-osmolality and hyponatremia: Secondary | ICD-10-CM | POA: Diagnosis present

## 2018-04-16 DIAGNOSIS — D509 Iron deficiency anemia, unspecified: Secondary | ICD-10-CM

## 2018-04-16 DIAGNOSIS — Z23 Encounter for immunization: Secondary | ICD-10-CM | POA: Diagnosis not present

## 2018-04-16 DIAGNOSIS — I10 Essential (primary) hypertension: Secondary | ICD-10-CM | POA: Diagnosis present

## 2018-04-16 DIAGNOSIS — S72001D Fracture of unspecified part of neck of right femur, subsequent encounter for closed fracture with routine healing: Secondary | ICD-10-CM | POA: Diagnosis not present

## 2018-04-16 DIAGNOSIS — S72001A Fracture of unspecified part of neck of right femur, initial encounter for closed fracture: Secondary | ICD-10-CM | POA: Diagnosis not present

## 2018-04-16 DIAGNOSIS — N179 Acute kidney failure, unspecified: Secondary | ICD-10-CM | POA: Diagnosis present

## 2018-04-16 DIAGNOSIS — N183 Chronic kidney disease, stage 3 (moderate): Secondary | ICD-10-CM | POA: Diagnosis present

## 2018-04-16 DIAGNOSIS — Z9889 Other specified postprocedural states: Secondary | ICD-10-CM | POA: Diagnosis not present

## 2018-04-16 DIAGNOSIS — D649 Anemia, unspecified: Secondary | ICD-10-CM | POA: Diagnosis not present

## 2018-04-16 DIAGNOSIS — R2689 Other abnormalities of gait and mobility: Secondary | ICD-10-CM | POA: Diagnosis present

## 2018-04-16 DIAGNOSIS — Z7401 Bed confinement status: Secondary | ICD-10-CM | POA: Diagnosis not present

## 2018-04-16 DIAGNOSIS — W19XXXA Unspecified fall, initial encounter: Secondary | ICD-10-CM | POA: Diagnosis not present

## 2018-04-16 DIAGNOSIS — R55 Syncope and collapse: Secondary | ICD-10-CM | POA: Diagnosis present

## 2018-04-16 DIAGNOSIS — M6281 Muscle weakness (generalized): Secondary | ICD-10-CM | POA: Diagnosis present

## 2018-04-16 DIAGNOSIS — R627 Adult failure to thrive: Secondary | ICD-10-CM | POA: Diagnosis present

## 2018-04-16 DIAGNOSIS — I129 Hypertensive chronic kidney disease with stage 1 through stage 4 chronic kidney disease, or unspecified chronic kidney disease: Secondary | ICD-10-CM | POA: Diagnosis not present

## 2018-04-16 DIAGNOSIS — E43 Unspecified severe protein-calorie malnutrition: Secondary | ICD-10-CM | POA: Diagnosis present

## 2018-04-16 DIAGNOSIS — Y92129 Unspecified place in nursing home as the place of occurrence of the external cause: Secondary | ICD-10-CM | POA: Diagnosis not present

## 2018-04-16 DIAGNOSIS — R5381 Other malaise: Secondary | ICD-10-CM | POA: Diagnosis not present

## 2018-04-16 DIAGNOSIS — F0390 Unspecified dementia without behavioral disturbance: Secondary | ICD-10-CM | POA: Diagnosis not present

## 2018-04-16 DIAGNOSIS — Z8781 Personal history of (healed) traumatic fracture: Secondary | ICD-10-CM | POA: Diagnosis not present

## 2018-04-16 DIAGNOSIS — G40909 Epilepsy, unspecified, not intractable, without status epilepticus: Secondary | ICD-10-CM | POA: Diagnosis not present

## 2018-04-16 DIAGNOSIS — Z4789 Encounter for other orthopedic aftercare: Secondary | ICD-10-CM | POA: Diagnosis not present

## 2018-04-16 DIAGNOSIS — R41841 Cognitive communication deficit: Secondary | ICD-10-CM | POA: Diagnosis present

## 2018-04-16 LAB — CBC
HCT: 27.4 % — ABNORMAL LOW (ref 36.0–46.0)
Hemoglobin: 8.6 g/dL — ABNORMAL LOW (ref 12.0–15.0)
MCH: 30.3 pg (ref 26.0–34.0)
MCHC: 31.4 g/dL (ref 30.0–36.0)
MCV: 96.5 fL (ref 80.0–100.0)
Platelets: 162 10*3/uL (ref 150–400)
RBC: 2.84 MIL/uL — ABNORMAL LOW (ref 3.87–5.11)
RDW: 14.5 % (ref 11.5–15.5)
WBC: 8.8 10*3/uL (ref 4.0–10.5)
nRBC: 0 % (ref 0.0–0.2)

## 2018-04-16 LAB — BASIC METABOLIC PANEL
Anion gap: 7 (ref 5–15)
BUN: 35 mg/dL — AB (ref 8–23)
CO2: 27 mmol/L (ref 22–32)
Calcium: 8.4 mg/dL — ABNORMAL LOW (ref 8.9–10.3)
Chloride: 105 mmol/L (ref 98–111)
Creatinine, Ser: 1.35 mg/dL — ABNORMAL HIGH (ref 0.44–1.00)
GFR calc Af Amer: 41 mL/min — ABNORMAL LOW (ref >60)
GFR calc non Af Amer: 35 mL/min — ABNORMAL LOW (ref >60)
Glucose, Bld: 111 mg/dL — ABNORMAL HIGH (ref 70–99)
Potassium: 3.9 mmol/L (ref 3.5–5.1)
Sodium: 139 mmol/L (ref 135–145)

## 2018-04-16 MED ORDER — TORSEMIDE 20 MG PO TABS: 20.0000 mg | ORAL_TABLET | Freq: Every day | ORAL | Status: DC

## 2018-04-16 MED ORDER — FERROUS SULFATE 325 (65 FE) MG PO TABS
325.0000 mg | ORAL_TABLET | Freq: Three times a day (TID) | ORAL | Status: DC
  Administered 2018-04-16 (×2): 325 mg via ORAL
  Filled 2018-04-16 (×2): qty 1

## 2018-04-16 MED ORDER — HYDROCODONE-ACETAMINOPHEN 5-325 MG PO TABS: 1.0000 | ORAL_TABLET | Freq: Three times a day (TID) | ORAL | 0 refills | Status: AC | PRN

## 2018-04-16 MED ORDER — DOCUSATE SODIUM 100 MG PO CAPS: 100.0000 mg | ORAL_CAPSULE | Freq: Two times a day (BID) | ORAL | Status: DC

## 2018-04-16 MED ORDER — ASPIRIN 325 MG PO TBEC: 325.0000 mg | DELAYED_RELEASE_TABLET | Freq: Every day | ORAL | Status: AC

## 2018-04-16 MED ORDER — PNEUMOCOCCAL VAC POLYVALENT 25 MCG/0.5ML IJ INJ
0.5000 mL | INJECTION | Freq: Once | INTRAMUSCULAR | Status: AC
  Administered 2018-04-16: 0.5 mL via INTRAMUSCULAR
  Filled 2018-04-16: qty 0.5

## 2018-04-16 NOTE — Progress Notes (Signed)
Physical Therapy Treatment Patient Details Name: Tina Kline MRN: [id number] DOB: October 16, 1929 Today's Date: 04/16/2018    History of Present Illness Tina Kline  is a 85 y.o. female,With history of dementia, seizure disorder on Keppra, who is currently residing at skilled nursing facility was brought to ED after patient had a fall.  Patient was brought to the ED and imaging studies showed right femoral neck fracture.  Patient denies passing out.  No chest pain or shortness of breath. Patient POD #1 Procedure open treatment internal fixation w/ gamma nail of the right hip. WBAT. HgB 7.4 with transfusion ordered.    PT Comments    Patient required active assistance to complete RLE exercises due to weakness/pain, required frequent repeated verbal cueing during bed mobility, had to used bed rail for rolling to side and sitting up at bedside.  Treatment limited due to patient having bowel movement and transport staff present to take patient to nursing home - nursing staff notified to clean patient.  Patient will benefit from continued physical therapy in hospital and recommended venue below to increase strength, balance, endurance for safe ADLs and gait.   Follow Up Recommendations  SNF;Supervision/Assistance - 24 hour;Supervision for mobility/OOB     Equipment Recommendations  None recommended by PT    Recommendations for Other Services       Precautions / Restrictions Precautions Precautions: Fall Restrictions Weight Bearing Restrictions: Yes RLE Weight Bearing: Weight bearing as tolerated    Mobility  Bed Mobility Overal bed mobility: Needs Assistance Bed Mobility: Supine to Sit;Sit to Supine     Supine to sit: Mod assist;Max assist Sit to supine: Mod assist;Max assist   General bed mobility comments: slow labored movement with increased right hip pain  Transfers                    Ambulation/Gait                 Stairs             Wheelchair  Mobility    Modified Rankin (Stroke Patients Only)       Balance Overall balance assessment: Needs assistance Sitting-balance support: Feet supported;No upper extremity supported Sitting balance-Leahy Scale: Fair                                      Cognition Arousal/Alertness: Awake/alert Behavior During Therapy: WFL for tasks assessed/performed Overall Cognitive Status: No family/caregiver present to determine baseline cognitive functioning                                 General Comments: history of dementia      Exercises Total Joint Exercises Ankle Circles/Pumps: Supine;AROM;Both;10 reps Short Arc Quad: Supine;AROM;Right;10 reps Heel Slides: Supine;AROM;Right;10 reps    General Comments        Pertinent Vitals/Pain Pain Assessment: Faces Faces Pain Scale: Hurts little more Pain Location: right hip with movement Pain Descriptors / Indicators: Grimacing;Guarding;Sore Pain Intervention(s): Limited activity within patient's tolerance;Monitored during session    Home Living                      Prior Function            PT Goals (current goals can now be found in the care plan section) Acute Rehab PT Goals Patient  Stated Goal: return home Time For Goal Achievement: 04/29/18 Potential to Achieve Goals: Fair Progress towards PT goals: Progressing toward goals    Frequency    Min 5X/week      PT Plan Current plan remains appropriate    Co-evaluation              AM-PAC PT "6 Clicks" Mobility   Outcome Measure  Help needed turning from your back to your side while in a flat bed without using bedrails?: A Lot Help needed moving from lying on your back to sitting on the side of a flat bed without using bedrails?: A Lot Help needed moving to and from a bed to a chair (including a wheelchair)?: A Lot Help needed standing up from a chair using your arms (e.g., wheelchair or bedside chair)?: A Lot Help needed to  walk in hospital room?: A Lot Help needed climbing 3-5 steps with a railing? : Total 6 Click Score: 11    End of Session   Activity Tolerance: Patient tolerated treatment well;Patient limited by fatigue;Patient limited by pain Patient left: in bed;with call bell/phone within reach Nurse Communication: Mobility status PT Visit Diagnosis: Unsteadiness on feet (R26.81);Other abnormalities of gait and mobility (R26.89);Muscle weakness (generalized) (M62.81);Difficulty in walking, not elsewhere classified (R26.2)     Time: 6720-9198 PT Time Calculation (min) (ACUTE ONLY): 20 min  Charges:  $Therapeutic Activity: 8-22 mins                     2:30 PM, 04/16/18 Lonell Grandchild, MPT Physical Therapist with [facility] [phone] office [phone] mobile phone

## 2018-04-16 NOTE — Care Management Important Message (Signed)
Important Message  Patient Details  Name: Tina Kline MRN: [id number] Date of Birth: [birth year]   Medicare Important Message Given:  Yes    Tommy Medal 04/16/2018, 1:45 PM

## 2018-04-16 NOTE — [place] FL2 (Signed)
[place] MEDICAID FL2 LEVEL OF CARE SCREENING TOOL     IDENTIFICATION  Patient Name: Tina Kline Birthdate: 1929/05/14 Sex: female Admission Date (Current Location): 04/13/2018  [facility] and [place] Number:  [employer] and Address:  [facility] [address], [place]      Provider Number: [id number]  Attending Physician Name and Address:  Barton Dubois, MD  Relative Name and Phone Number:       Current Level of Care: Hospital Recommended Level of Care: [place] Prior Approval Number:    Date Approved/Denied:   PASRR Number: [id number]  Discharge Plan: SNF    Current Diagnoses: Patient Active Problem List   Diagnosis Date Noted  . Closed fracture of right hip ([place])   . Femur fracture, right ([place]) 04/13/2018  . Protein-calorie malnutrition, severe 01/03/2018  . Essential hypertension   . Failure to thrive in adult 01/02/2018  . Acute renal failure superimposed on stage 3 chronic kidney disease ([place]) 01/02/2018  . Goals of care, counseling/discussion 01/02/2018  . Dehydration   . Hyponatremia   . Syncope and collapse   . Palliative care by specialist   . Dementia ([place]) 01/01/2018  . H. pylori infection 09/27/2017  . Dark stools 09/27/2017  . Syncope 06/20/2017  . Anemia   . Dysphagia 01/20/2016  . IDA (iron deficiency anemia) 06/08/2015  . Chronic renal disease 06/08/2015  . Arthritis of knee, degenerative 06/13/2013    Orientation RESPIRATION BLADDER Height & Weight     Self, Situation  Normal Incontinent Weight: 56.2 kg Height:     BEHAVIORAL SYMPTOMS/MOOD NEUROLOGICAL BOWEL NUTRITION STATUS      Incontinent Diet(dysphagia 1)  AMBULATORY STATUS COMMUNICATION OF NEEDS Skin   Limited Assist Verbally Surgical wounds(surgical incision to right hip)                       Personal Care Assistance Level of Assistance  Bathing, Feeding, Dressing, Total care Bathing Assistance: Limited  assistance Feeding assistance: Independent Dressing Assistance: Limited assistance Total Care Assistance: Limited assistance   Functional Limitations Info  Sight, Hearing, Speech Sight Info: (uses left eye to see) Hearing Info: Adequate Speech Info: Adequate    SPECIAL CARE FACTORS FREQUENCY  PT (By licensed PT), OT (By licensed OT)     PT Frequency: X5 OT Frequency: X5            Contractures Contractures Info: Not present    Additional Factors Info  Code Status Code Status Info: Full code             Current Medications (04/16/2018):  This is the current hospital active medication list Current Facility-Administered Medications  Medication Dose Route Frequency Provider Last Rate Last Dose  . aspirin EC tablet 325 mg  325 mg Oral Q breakfast Carole Civil, MD   325 mg at 04/16/18 1751  . Chlorhexidine Gluconate Cloth 2 % PADS 6 each  6 each Topical Q0600 Carole Civil, MD   6 each at 04/16/18 [phone]  . docusate sodium (COLACE) capsule 100 mg  100 mg Oral BID Carole Civil, MD   100 mg at 04/16/18 0901  . donepezil (ARICEPT) tablet 10 mg  10 mg Oral QHS Carole Civil, MD   10 mg at 04/15/18 2202  . ferrous sulfate tablet 325 mg  325 mg Oral TID WC Barton Dubois, MD   325 mg at 04/16/18 5277  . HYDROcodone-acetaminophen (NORCO/VICODIN) 5-325 MG  per tablet 1 tablet  1 tablet Oral Q6H PRN Barton Dubois, MD      . levETIRAcetam (KEPPRA) 100 MG/ML solution 250 mg  250 mg Oral BID Carole Civil, MD   250 mg at 04/16/18 0901  . menthol-cetylpyridinium (CEPACOL) lozenge 3 mg  1 lozenge Oral PRN Carole Civil, MD       Or  . phenol (CHLORASEPTIC) mouth spray 1 spray  1 spray Mouth/Throat PRN Carole Civil, MD      . methocarbamol (ROBAXIN) tablet 500 mg  500 mg Oral Q6H PRN Carole Civil, MD   500 mg at 04/15/18 0858   Or  . methocarbamol (ROBAXIN) 500 mg in dextrose 5 % 50 mL IVPB  500 mg Intravenous Q6H PRN Carole Civil, MD       . metoCLOPramide (REGLAN) tablet 5-10 mg  5-10 mg Oral Q8H PRN Carole Civil, MD       Or  . metoCLOPramide (REGLAN) injection 5-10 mg  5-10 mg Intravenous Q8H PRN Carole Civil, MD      . morphine 2 MG/ML injection 0.5 mg  0.5 mg Intravenous Q8H PRN Barton Dubois, MD      . mupirocin ointment (BACTROBAN) 2 % 1 application  1 application Nasal BID Carole Civil, MD   1 application at 43/32/95 0901  . ondansetron (ZOFRAN) tablet 4 mg  4 mg Oral Q6H PRN Carole Civil, MD       Or  . ondansetron [facility]) injection 4 mg  4 mg Intravenous Q6H PRN Carole Civil, MD      . pneumococcal 23 valent vaccine (PNU-IMMUNE) injection 0.5 mL  0.5 mL Intramuscular Tomorrow-1000 Carole Civil, MD      . polyethylene glycol (MIRALAX / GLYCOLAX) packet 17 g  17 g Oral Daily Carole Civil, MD   17 g at 04/16/18 1884  . traMADol (ULTRAM) tablet 50 mg  50 mg Oral Q12H Carole Civil, MD   50 mg at 04/16/18 0518  . traZODone (DESYREL) tablet 25 mg  25 mg Oral QHS Carole Civil, MD   25 mg at 04/15/18 2202     Discharge Medications: Please see discharge summary for a list of discharge medications.  Relevant Imaging Results:  Relevant Lab Results:   Additional Information SSN [place]  Sherald Barge, RN

## 2018-04-16 NOTE — Progress Notes (Signed)
IV removed, patient tolerated well.  Report called to Denton Ar, LPN at [facility] and all questions answered. Patient awaiting EMS arrival to transport to [facility].  Patient's daughter aware patient will be discharging back to [facility] today.

## 2018-04-16 NOTE — Discharge Summary (Signed)
Physician Discharge Summary  Tina Kline JME:[id number] DOB: 1929-07-28 DOA: 04/13/2018  PCP: [name], MD  Admit date: 04/13/2018 Discharge date: 04/16/2018  Time spent: 35 minutes  Recommendations for Outpatient Follow-up:  1. Repeat CBC in 5 days to follow Hgb trend 2. Repeat BMET in 5 days to follow electrolytes and renal function  3. Outpatient follow up with orthopedic surgery in 2 weeks   Discharge Diagnoses:  Benign essential HTN Closed fracture of right hip (HCC) CKD stage 3 Dementia w/o behavioral disturbances Seizure disorder Essential HTN Insomnia Acute on Chronic hx of anemia (iron deficiency with blood loss anemia component post surgery)  Discharge Condition: stable and improved. Discharge back to SNF for further care and rehab.  Diet recommendation: heart healthy diet   Filed Weights   04/13/18 1540 04/13/18 2300 04/15/18 0500  Weight: 49.9 kg 54.9 kg 56.2 kg    Brief History of present illness:  85 y.o.female,With history of dementia,seizure disorder on Keppra, who is currently residing at skilled nursing facility was brought to ED after patient had a fall. Patient was brought to the ED and imaging studies showed right femoral neck fracture.Patient denies passing out. No chest pain or shortness of breath. No nausea vomiting or diarrhea. Patient does not remember exact event which led to fall.  Orthopedic surgery on board and with plans for ORIF during this admission.  Hospital Course:  1-close fracture, right hip [facility]) -Status post ORIF on 04/14/2018 -Continue as needed analgesics -Follow-up postsurgical recommendations by orthopedic service. -Patient will require skilled nursing facility for further care and rehabilitation.  2-history of seizure disorder -Continue Keppra -Seizure precaution order in place -Diethylamine reports of seizure over 9 years per daughter's report at time of admission.  3-dementia without behavioral  disturbances -Continue supportive care -Continue Aricept.  4-hypertension -resume adjusted dose of Demadex on 04/17/18 -Blood pressure stable -heart healthy diet recommended   5-insomnia -continue PRN tazodone  6-prior hx of iron def anemia: chronic; now with acute component after surgery most likely suggesting blood loss anemia and hemodilution. -Transfused 1 unit of PRBCs -Continue ferrous sulfate twice a day -Follow hemoglobin trend; at discharge Hgb 8.6  7-CKD stage 3 -CKD base on GFR -overall stable -continue to follow trend intermittently -maintain adequate hydration   Procedures:  See below for x-ray reports  ORIF 04/14/18  Consultations:  Orthopedic service  Discharge Exam: Vitals:   04/16/18 0036 04/16/18 0403  BP: (!) 121/45 (!) 115/50  Pulse: 81 80  Resp:    Temp: 99.3 F (37.4 C) 99.6 F (37.6 C)  SpO2: 100% 100%   General exam: Alert, awake, oriented x 2; afebrile and in no major distress.  Patient is stable and ready to return to SNF. Respiratory system: Clear to auscultation. Respiratory effort normal. Cardiovascular system:RRR. No murmurs, rubs, gallops. Gastrointestinal system: Abdomen is nondistended, soft and nontender. No organomegaly or masses felt. Normal bowel sounds heard. Central nervous system: Alert and oriented. No focal neurological deficits. Extremities: No cyanosis or clubbing.  Decreased range of motion right lower extremity due to recent fracture/surgery. Skin: No rashes, no petechiae. RLE with lateral dressing in place; no signs of superimposed infection. Psychiatry: Mood & affect appropriate.    Discharge Instructions   Discharge Instructions    Diet - low sodium heart healthy   Complete by:  As directed    Discharge instructions   Complete by:  As directed    Physical therapy as per SNF protocol Maintain adequate hydration  Weightbearing as tolerated  Staples can be removed postop day 12-14 Full dose aspirin for  Anticoagulation for 28 days Follow-up visit with Dr. Aline Brochure at 2 weeks for x-rays and then x-rays at 6 weeks and 12 weeks.     Allergies as of 04/16/2018   No Known Allergies     Medication List    TAKE these medications   acetaminophen 325 MG tablet Commonly known as:  TYLENOL Take 650 mg by mouth every 6 (six) hours as needed for mild pain or headache.   aspirin 325 MG EC tablet Take 1 tablet (325 mg total) by mouth daily with breakfast for 28 days. Start taking on:  April 17, 2018   docusate sodium 100 MG capsule Commonly known as:  COLACE Take 1 capsule (100 mg total) by mouth 2 (two) times daily.   donepezil 10 MG tablet Commonly known as:  ARICEPT Take 10 mg by mouth at bedtime.   ferrous sulfate 325 (65 FE) MG EC tablet Take 1 tablet (325 mg total) by mouth 2 (two) times daily.   HYDROcodone-acetaminophen 5-325 MG tablet Commonly known as:  NORCO/VICODIN Take 1 tablet by mouth every 8 (eight) hours as needed for up to 7 days for severe pain.   levETIRAcetam 100 MG/ML solution Commonly known as:  KEPPRA Take 250 mg by mouth 2 (two) times daily.   torsemide 20 MG tablet Commonly known as:  DEMADEX Take 1 tablet (20 mg total) by mouth daily. Start taking on:  April 17, 2018 What changed:    how much to take  when to take this   traZODone 50 MG tablet Commonly known as:  DESYREL Take 0.5 tablets (25 mg total) by mouth at bedtime.   vitamin C 500 MG tablet Commonly known as:  ASCORBIC ACID Take 500 mg by mouth 2 (two) times daily.      No Known Allergies Follow-up Information    Carole Civil, MD Follow up.   Specialties:  Orthopedic Surgery, Radiology Contact information: [address] [place] [address] [phone]           The results of significant diagnostics from this hospitalization (including imaging, microbiology, ancillary and laboratory) are listed below for reference.    Significant Diagnostic Studies: Dg Chest  1 View  Result Date: 04/13/2018 CLINICAL DATA:  Fall with femur fracture EXAM: CHEST  1 VIEW COMPARISON:  01/01/2018 FINDINGS: No acute opacity or pleural effusion. Normal cardiomediastinal silhouette. Moderate hiatal hernia. IMPRESSION: No active disease.  Hiatal hernia Electronically Signed   By: Donavan Foil M.D.   On: 04/13/2018 17:29   Ct Head Wo Contrast  Result Date: 04/13/2018 CLINICAL DATA:  Per ED notes: Pt is from [facility]. Pt fell early this morning. Xray performed at SNF, and it showed a RT femur fx. Family stated that patient has not slept for two days. She has advanced dementia. Pt sleepy at this time. EXAM: CT HEAD WITHOUT CONTRAST TECHNIQUE: Contiguous axial images were obtained from the base of the skull through the vertex without intravenous contrast. COMPARISON:  01/01/2018, 06/21/2017, 06/20/2017 FINDINGS: Brain: Partially calcified extra-axial mass in the LEFT parietal region is stable in appearance compared to prior studies, measuring approximately 14 millimeters today. By a previous MRI, characterization by prior MRI it was consistent with meningioma. Small RIGHT frontal calcified meningioma is 5.6 centimeters. There is central and cortical atrophy. Periventricular white matter changes are consistent with small vessel disease. No acute infarct or intracranial hemorrhage. Vascular: There is atherosclerotic calcification of the  internal carotid arteries. No hyperdense vessels. Skull: Normal. Negative for fracture or focal lesion. Sinuses/Orbits: Mild mucosal swelling of the paranasal sinuses. Other: None IMPRESSION: 1. No evidence for acute intracranial abnormality. 2. Atrophy and small vessel disease. 3. Stable LEFT parietal and RIGHT frontal meningiomas. No mass effect or edema. Electronically Signed   By: Nolon Nations M.D.   On: 04/13/2018 16:43   Dg Knee Complete 4 Views Right  Result Date: 04/13/2018 CLINICAL DATA:  Fall this morning, right hip fracture EXAM: RIGHT KNEE -  COMPLETE 4+ VIEW COMPARISON:  05/13/2013 right knee radiograph FINDINGS: Diffuse osteopenia. Trace suprapatellar right knee joint effusion. No right knee fracture or dislocation. Moderate tricompartmental right knee osteoarthritis, unchanged. No suspicious focal osseous lesions. No radiopaque foreign body. IMPRESSION: Trace suprapatellar right knee joint effusion. No right knee fracture or dislocation. Diffuse osteopenia. Moderate tricompartmental right knee osteoarthritis. Electronically Signed   By: Ilona Sorrel M.D.   On: 04/13/2018 19:25   Dg Hip Operative Unilat W Or W/o Pelvis Right  Result Date: 04/14/2018 CLINICAL DATA:  Right hip fracture. EXAM: OPERATIVE right HIP (WITH PELVIS IF PERFORMED) 11 VIEWS TECHNIQUE: Fluoroscopic spot image(s) were submitted for interpretation post-operatively. COMPARISON:  04/13/2018 FINDINGS: multiple intraoperative views. These demonstrate placement of a pain and screw fixation device across the previously described proximal femur fracture. Alignment is anatomic and no hardware complication is identified. IMPRESSION: Intraoperative imaging of right proximal femur fixation. Electronically Signed   By: Abigail Miyamoto M.D.   On: 04/14/2018 14:16   Dg Hips Bilat With Pelvis Min 5 Views  Result Date: 04/13/2018 CLINICAL DATA:  Fall EXAM: DG HIP (WITH OR WITHOUT PELVIS) 5+V BILAT COMPARISON:  None. FINDINGS: Multiple pelvic calcifications, could be uterine in etiology. Bowel gas obscures the sacrum. Pubic symphysis appears intact. No acute displaced fracture or malalignment at the left hip. Acute displaced and likely impacted fracture at the base of the femoral neck with extension into the trochanteric region. No femoral head dislocation. IMPRESSION: 1. Acute displaced and slightly impacted appearing fracture involving the base of the femoral neck and trochanteric region of the right femur. 2. No acute osseous abnormality of the left hip. 3. Multiple coarse calcifications in the  pelvis are presumably uterine in etiology Electronically Signed   By: Donavan Foil M.D.   On: 04/13/2018 17:28    Microbiology: Recent Results (from the past 240 hour(s))  MRSA PCR Screening     Status: None   Collection Time: 04/13/18 10:24 PM  Result Value Ref Range Status   MRSA by PCR NEGATIVE NEGATIVE Final    Comment:        The GeneXpert MRSA Assay (FDA approved for NASAL specimens only), is one component of a comprehensive MRSA colonization surveillance program. It is not intended to diagnose MRSA infection nor to guide or monitor treatment for MRSA infections. Performed at [facility], [address] [place], [place] [address]      Labs: Basic Metabolic Panel: Recent Labs  Lab 04/13/18 1711 04/14/18 0635 04/15/18 0705 04/16/18 0605  NA 136 138 140 139  K 3.4* 3.5 4.1 3.9  CL 98 102 105 105  CO2 29 27 25 27   GLUCOSE 112* 103* 99 111*  BUN 15 16 28* 35*  CREATININE 1.19* 1.17* 1.37* 1.35*  CALCIUM 9.3 8.8* 8.5* 8.4*   Liver Function Tests: Recent Labs  Lab 04/13/18 1711  AST 29  ALT 47*  ALKPHOS 157*  BILITOT 0.5  PROT 7.6  ALBUMIN 3.9  CBC: Recent Labs  Lab 04/13/18 1711 04/14/18 0635 04/15/18 0705 04/16/18 0605  WBC 9.2 5.5 10.9* 8.8  NEUTROABS 7.6  --   --   --   HGB 10.2* 8.6* 7.4* 8.6*  HCT 33.0* 28.0* 24.1* 27.4*  MCV 98.2 97.6 97.6 96.5  PLT 251 196 172 162    Signed:  Barton Dubois MD.  [facility] 04/16/2018, 12:44 PM

## 2018-04-16 NOTE — Care Management Note (Signed)
From [facility] with femur fracture s/p repair. Pt long term resident at [facility]. Per daughter was able to ambulate with RW with supervision. Pt has dementia and fell while trying to get to bathroom by herself. Pt ready for DC today, will return as STR. Dc plan discussed with Debbie at [facility] and daughter at bedside. EMS transport arranged for 2pm per RN request.

## 2018-04-17 LAB — BPAM RBC
BLOOD PRODUCT EXPIRATION DATE: 202004042359
Blood Product Expiration Date: 202004032359
Blood Product Expiration Date: 202004082359
ISSUE DATE / TIME: 202003081323
UNIT TYPE AND RH: 1700
UNIT TYPE AND RH: 5100
Unit Type and Rh: 1700

## 2018-04-17 LAB — TYPE AND SCREEN
ABO/RH(D): B POS
Antibody Screen: NEGATIVE
Unit division: 0
Unit division: 0
Unit division: 0

## 2018-04-18 DIAGNOSIS — S72001A Fracture of unspecified part of neck of right femur, initial encounter for closed fracture: Secondary | ICD-10-CM | POA: Diagnosis not present

## 2018-04-18 DIAGNOSIS — F0390 Unspecified dementia without behavioral disturbance: Secondary | ICD-10-CM | POA: Diagnosis not present

## 2018-04-18 DIAGNOSIS — G40909 Epilepsy, unspecified, not intractable, without status epilepticus: Secondary | ICD-10-CM | POA: Diagnosis not present

## 2018-04-18 DIAGNOSIS — D649 Anemia, unspecified: Secondary | ICD-10-CM | POA: Diagnosis not present

## 2018-04-19 DIAGNOSIS — G40909 Epilepsy, unspecified, not intractable, without status epilepticus: Secondary | ICD-10-CM | POA: Diagnosis not present

## 2018-04-19 DIAGNOSIS — S72001A Fracture of unspecified part of neck of right femur, initial encounter for closed fracture: Secondary | ICD-10-CM | POA: Diagnosis not present

## 2018-04-19 DIAGNOSIS — D649 Anemia, unspecified: Secondary | ICD-10-CM | POA: Diagnosis not present

## 2018-04-19 DIAGNOSIS — F0390 Unspecified dementia without behavioral disturbance: Secondary | ICD-10-CM | POA: Diagnosis not present

## 2018-04-23 DIAGNOSIS — I129 Hypertensive chronic kidney disease with stage 1 through stage 4 chronic kidney disease, or unspecified chronic kidney disease: Secondary | ICD-10-CM | POA: Diagnosis not present

## 2018-04-23 DIAGNOSIS — S72001A Fracture of unspecified part of neck of right femur, initial encounter for closed fracture: Secondary | ICD-10-CM | POA: Diagnosis not present

## 2018-04-23 DIAGNOSIS — N183 Chronic kidney disease, stage 3 (moderate): Secondary | ICD-10-CM | POA: Diagnosis not present

## 2018-04-23 DIAGNOSIS — D649 Anemia, unspecified: Secondary | ICD-10-CM | POA: Diagnosis not present

## 2018-04-30 ENCOUNTER — Ambulatory Visit (INDEPENDENT_AMBULATORY_CARE_PROVIDER_SITE_OTHER): Payer: Medicare Other

## 2018-04-30 ENCOUNTER — Other Ambulatory Visit: Payer: Self-pay

## 2018-04-30 ENCOUNTER — Ambulatory Visit (INDEPENDENT_AMBULATORY_CARE_PROVIDER_SITE_OTHER): Payer: Self-pay | Admitting: Orthopedic Surgery

## 2018-04-30 VITALS — Ht 61.0 in | Wt 123.0 lb

## 2018-04-30 DIAGNOSIS — Z9889 Other specified postprocedural states: Secondary | ICD-10-CM

## 2018-04-30 DIAGNOSIS — Z8781 Personal history of (healed) traumatic fracture: Secondary | ICD-10-CM

## 2018-04-30 NOTE — Progress Notes (Signed)
Patient ID: Nile Riggs, female   DOB: 1929/05/06, 85 y.o.   MRN: [id number]  FRACTURE CARE   No chief complaint on file.   Encounter Diagnosis  Name Primary?  . S/P fixation of right hip fracture 04/14/18 IM nail  Yes   04/14/2018  12:25 PM  PATIENT:  Rivkah L Deshong  85 y.o. female  POD # 16   PROCEDURE:  Procedure(s) with comments: INTRAMEDULLARY (IM) NAIL INTERTROCHANTRIC (Right) - 1000 am - [id number]  SURGEON:  Surgeon(s) and Role:    * Carole Civil, MD - Primary  Open treatment internal fixation of the hip with gamma nail  Implant [employer] 125 degree gamma nail with 95 mm lag screw 35 mm distal locking screw proximal acorn and sliding mode  Date of surgery April 14, 2018  CURRENT TREATMENT : Gamma nail, physical therapy  POST INJURY DAY:   GLOBAL PERIOD DAY 16/90 ( UNTIL MAY 16)  Her leg lengths are good her hip flexion is good with passive and active range of motion  X-ray in 6 weeks

## 2018-05-10 DIAGNOSIS — S72001A Fracture of unspecified part of neck of right femur, initial encounter for closed fracture: Secondary | ICD-10-CM | POA: Diagnosis not present

## 2018-05-10 DIAGNOSIS — N183 Chronic kidney disease, stage 3 (moderate): Secondary | ICD-10-CM | POA: Diagnosis not present

## 2018-05-10 DIAGNOSIS — G40909 Epilepsy, unspecified, not intractable, without status epilepticus: Secondary | ICD-10-CM | POA: Diagnosis not present

## 2018-05-10 DIAGNOSIS — I129 Hypertensive chronic kidney disease with stage 1 through stage 4 chronic kidney disease, or unspecified chronic kidney disease: Secondary | ICD-10-CM | POA: Diagnosis not present

## 2018-05-16 DIAGNOSIS — F0390 Unspecified dementia without behavioral disturbance: Secondary | ICD-10-CM | POA: Diagnosis not present

## 2018-05-16 DIAGNOSIS — I10 Essential (primary) hypertension: Secondary | ICD-10-CM | POA: Diagnosis not present

## 2018-05-16 DIAGNOSIS — G40909 Epilepsy, unspecified, not intractable, without status epilepticus: Secondary | ICD-10-CM | POA: Diagnosis not present

## 2018-06-11 ENCOUNTER — Other Ambulatory Visit: Payer: Self-pay

## 2018-06-11 ENCOUNTER — Ambulatory Visit (INDEPENDENT_AMBULATORY_CARE_PROVIDER_SITE_OTHER): Payer: Medicare Other | Admitting: Orthopedic Surgery

## 2018-06-11 ENCOUNTER — Ambulatory Visit (INDEPENDENT_AMBULATORY_CARE_PROVIDER_SITE_OTHER): Payer: Medicare Other

## 2018-06-11 ENCOUNTER — Encounter: Payer: Self-pay | Admitting: Orthopedic Surgery

## 2018-06-11 VITALS — Temp 97.2°F

## 2018-06-11 DIAGNOSIS — Z8781 Personal history of (healed) traumatic fracture: Secondary | ICD-10-CM | POA: Diagnosis not present

## 2018-06-11 DIAGNOSIS — Z9889 Other specified postprocedural states: Secondary | ICD-10-CM

## 2018-06-11 NOTE — Patient Instructions (Signed)
Continue therapy   Weight bearing as tolerated

## 2018-06-11 NOTE — Progress Notes (Signed)
POSTOP VISIT  POD # 29  Chief Complaint  Patient presents with  . Post-op Follow-up    04/14/18 improving but has had another fall 06/06/18    85 year old female fell and fractured her right hip 8 weeks ago she had a gamma nail inserted.  She fell again.  She has no complaints of increased pain her therapy is going slowly  I moved her leg easily her limb alignment was good her lengths were normal her hip flexion was good without pain   Encounter Diagnosis  Name Primary?  . S/P fixation of right hip fracture 04/14/18 IM nail  Yes    X-ray was taken X-ray report AP pelvis AP lateral right hip  Gamma nail noted in right hip fracture appears to be healing well The lag screw is in the lower portion of the femoral neck but no complications are seen  Impression open treatment internal fixation right hip no complicating features are seen in fracture is healing appropriately    Postoperative plan (Work, WB, No orders of the defined types were placed in this encounter. ,FU)  4 weeks with x-rays

## 2018-07-05 DIAGNOSIS — I129 Hypertensive chronic kidney disease with stage 1 through stage 4 chronic kidney disease, or unspecified chronic kidney disease: Secondary | ICD-10-CM | POA: Diagnosis not present

## 2018-07-05 DIAGNOSIS — D649 Anemia, unspecified: Secondary | ICD-10-CM | POA: Diagnosis not present

## 2018-07-05 DIAGNOSIS — S72001D Fracture of unspecified part of neck of right femur, subsequent encounter for closed fracture with routine healing: Secondary | ICD-10-CM | POA: Diagnosis not present

## 2018-07-05 DIAGNOSIS — N183 Chronic kidney disease, stage 3 (moderate): Secondary | ICD-10-CM | POA: Diagnosis not present

## 2018-07-09 ENCOUNTER — Encounter: Payer: Self-pay | Admitting: Orthopedic Surgery

## 2018-07-09 ENCOUNTER — Ambulatory Visit (INDEPENDENT_AMBULATORY_CARE_PROVIDER_SITE_OTHER): Payer: Medicare Other

## 2018-07-09 ENCOUNTER — Ambulatory Visit (INDEPENDENT_AMBULATORY_CARE_PROVIDER_SITE_OTHER): Payer: Medicare Other | Admitting: Orthopedic Surgery

## 2018-07-09 ENCOUNTER — Other Ambulatory Visit: Payer: Self-pay

## 2018-07-09 VITALS — Temp 97.2°F

## 2018-07-09 DIAGNOSIS — Z9889 Other specified postprocedural states: Secondary | ICD-10-CM

## 2018-07-09 DIAGNOSIS — Z8781 Personal history of (healed) traumatic fracture: Secondary | ICD-10-CM

## 2018-07-09 NOTE — Progress Notes (Signed)
POSTOP VISIT  POD # 3 months   Chief Complaint  Patient presents with  . Routine Post Op    Right Hip DOS 04/14/18    85 year old female status post gamma nail fixation right hip doing well   Encounter Diagnosis  Name Primary?  . S/P fixation of right hip fracture 04/14/18 IM nail  Yes    See x-ray report fracture appears to have healed implant is in good position  Patient is ambulatory with therapy at the nursing home  Postoperative plan (Work, WB, No orders of the defined types were placed in this encounter. ,FU)  She was released

## 2018-07-11 DIAGNOSIS — D649 Anemia, unspecified: Secondary | ICD-10-CM | POA: Diagnosis not present

## 2018-07-11 DIAGNOSIS — F0390 Unspecified dementia without behavioral disturbance: Secondary | ICD-10-CM | POA: Diagnosis not present

## 2018-07-11 DIAGNOSIS — G40909 Epilepsy, unspecified, not intractable, without status epilepticus: Secondary | ICD-10-CM | POA: Diagnosis not present

## 2018-07-24 DIAGNOSIS — F0390 Unspecified dementia without behavioral disturbance: Secondary | ICD-10-CM | POA: Diagnosis not present

## 2018-07-24 DIAGNOSIS — R1312 Dysphagia, oropharyngeal phase: Secondary | ICD-10-CM | POA: Diagnosis not present

## 2018-07-24 DIAGNOSIS — E43 Unspecified severe protein-calorie malnutrition: Secondary | ICD-10-CM | POA: Diagnosis not present

## 2018-07-25 DIAGNOSIS — F0390 Unspecified dementia without behavioral disturbance: Secondary | ICD-10-CM | POA: Diagnosis not present

## 2018-07-25 DIAGNOSIS — R1312 Dysphagia, oropharyngeal phase: Secondary | ICD-10-CM | POA: Diagnosis not present

## 2018-07-25 DIAGNOSIS — E43 Unspecified severe protein-calorie malnutrition: Secondary | ICD-10-CM | POA: Diagnosis not present

## 2018-07-26 DIAGNOSIS — R1312 Dysphagia, oropharyngeal phase: Secondary | ICD-10-CM | POA: Diagnosis not present

## 2018-07-26 DIAGNOSIS — E43 Unspecified severe protein-calorie malnutrition: Secondary | ICD-10-CM | POA: Diagnosis not present

## 2018-07-26 DIAGNOSIS — F0390 Unspecified dementia without behavioral disturbance: Secondary | ICD-10-CM | POA: Diagnosis not present

## 2018-07-27 DIAGNOSIS — E43 Unspecified severe protein-calorie malnutrition: Secondary | ICD-10-CM | POA: Diagnosis not present

## 2018-07-27 DIAGNOSIS — F0390 Unspecified dementia without behavioral disturbance: Secondary | ICD-10-CM | POA: Diagnosis not present

## 2018-07-27 DIAGNOSIS — R1312 Dysphagia, oropharyngeal phase: Secondary | ICD-10-CM | POA: Diagnosis not present

## 2018-07-29 DIAGNOSIS — R1312 Dysphagia, oropharyngeal phase: Secondary | ICD-10-CM | POA: Diagnosis not present

## 2018-07-29 DIAGNOSIS — F0390 Unspecified dementia without behavioral disturbance: Secondary | ICD-10-CM | POA: Diagnosis not present

## 2018-07-29 DIAGNOSIS — E43 Unspecified severe protein-calorie malnutrition: Secondary | ICD-10-CM | POA: Diagnosis not present

## 2018-07-31 DIAGNOSIS — E43 Unspecified severe protein-calorie malnutrition: Secondary | ICD-10-CM | POA: Diagnosis not present

## 2018-07-31 DIAGNOSIS — F0390 Unspecified dementia without behavioral disturbance: Secondary | ICD-10-CM | POA: Diagnosis not present

## 2018-07-31 DIAGNOSIS — R1312 Dysphagia, oropharyngeal phase: Secondary | ICD-10-CM | POA: Diagnosis not present

## 2018-08-01 DIAGNOSIS — F0390 Unspecified dementia without behavioral disturbance: Secondary | ICD-10-CM | POA: Diagnosis not present

## 2018-08-01 DIAGNOSIS — R1312 Dysphagia, oropharyngeal phase: Secondary | ICD-10-CM | POA: Diagnosis not present

## 2018-08-01 DIAGNOSIS — E43 Unspecified severe protein-calorie malnutrition: Secondary | ICD-10-CM | POA: Diagnosis not present

## 2018-08-02 DIAGNOSIS — E43 Unspecified severe protein-calorie malnutrition: Secondary | ICD-10-CM | POA: Diagnosis not present

## 2018-08-02 DIAGNOSIS — F0390 Unspecified dementia without behavioral disturbance: Secondary | ICD-10-CM | POA: Diagnosis not present

## 2018-08-02 DIAGNOSIS — R1312 Dysphagia, oropharyngeal phase: Secondary | ICD-10-CM | POA: Diagnosis not present

## 2018-08-03 DIAGNOSIS — F0390 Unspecified dementia without behavioral disturbance: Secondary | ICD-10-CM | POA: Diagnosis not present

## 2018-08-03 DIAGNOSIS — E43 Unspecified severe protein-calorie malnutrition: Secondary | ICD-10-CM | POA: Diagnosis not present

## 2018-08-03 DIAGNOSIS — R1312 Dysphagia, oropharyngeal phase: Secondary | ICD-10-CM | POA: Diagnosis not present

## 2018-08-06 DIAGNOSIS — F0390 Unspecified dementia without behavioral disturbance: Secondary | ICD-10-CM | POA: Diagnosis not present

## 2018-08-06 DIAGNOSIS — E43 Unspecified severe protein-calorie malnutrition: Secondary | ICD-10-CM | POA: Diagnosis not present

## 2018-08-06 DIAGNOSIS — R1312 Dysphagia, oropharyngeal phase: Secondary | ICD-10-CM | POA: Diagnosis not present

## 2018-08-07 DIAGNOSIS — E43 Unspecified severe protein-calorie malnutrition: Secondary | ICD-10-CM | POA: Diagnosis not present

## 2018-08-07 DIAGNOSIS — F0390 Unspecified dementia without behavioral disturbance: Secondary | ICD-10-CM | POA: Diagnosis not present

## 2018-08-07 DIAGNOSIS — R1312 Dysphagia, oropharyngeal phase: Secondary | ICD-10-CM | POA: Diagnosis not present

## 2018-08-08 DIAGNOSIS — R1312 Dysphagia, oropharyngeal phase: Secondary | ICD-10-CM | POA: Diagnosis not present

## 2018-08-08 DIAGNOSIS — E43 Unspecified severe protein-calorie malnutrition: Secondary | ICD-10-CM | POA: Diagnosis not present

## 2018-08-08 DIAGNOSIS — F0390 Unspecified dementia without behavioral disturbance: Secondary | ICD-10-CM | POA: Diagnosis not present

## 2018-08-09 DIAGNOSIS — R1312 Dysphagia, oropharyngeal phase: Secondary | ICD-10-CM | POA: Diagnosis not present

## 2018-08-09 DIAGNOSIS — F0390 Unspecified dementia without behavioral disturbance: Secondary | ICD-10-CM | POA: Diagnosis not present

## 2018-08-09 DIAGNOSIS — E43 Unspecified severe protein-calorie malnutrition: Secondary | ICD-10-CM | POA: Diagnosis not present

## 2018-08-10 DIAGNOSIS — R1312 Dysphagia, oropharyngeal phase: Secondary | ICD-10-CM | POA: Diagnosis not present

## 2018-08-10 DIAGNOSIS — E43 Unspecified severe protein-calorie malnutrition: Secondary | ICD-10-CM | POA: Diagnosis not present

## 2018-08-10 DIAGNOSIS — F0390 Unspecified dementia without behavioral disturbance: Secondary | ICD-10-CM | POA: Diagnosis not present

## 2018-08-13 DIAGNOSIS — F0390 Unspecified dementia without behavioral disturbance: Secondary | ICD-10-CM | POA: Diagnosis not present

## 2018-08-13 DIAGNOSIS — R1312 Dysphagia, oropharyngeal phase: Secondary | ICD-10-CM | POA: Diagnosis not present

## 2018-08-13 DIAGNOSIS — E43 Unspecified severe protein-calorie malnutrition: Secondary | ICD-10-CM | POA: Diagnosis not present

## 2018-08-14 DIAGNOSIS — R1312 Dysphagia, oropharyngeal phase: Secondary | ICD-10-CM | POA: Diagnosis not present

## 2018-08-14 DIAGNOSIS — F0390 Unspecified dementia without behavioral disturbance: Secondary | ICD-10-CM | POA: Diagnosis not present

## 2018-08-14 DIAGNOSIS — E43 Unspecified severe protein-calorie malnutrition: Secondary | ICD-10-CM | POA: Diagnosis not present

## 2018-08-15 DIAGNOSIS — R1312 Dysphagia, oropharyngeal phase: Secondary | ICD-10-CM | POA: Diagnosis not present

## 2018-08-15 DIAGNOSIS — E43 Unspecified severe protein-calorie malnutrition: Secondary | ICD-10-CM | POA: Diagnosis not present

## 2018-08-15 DIAGNOSIS — F0390 Unspecified dementia without behavioral disturbance: Secondary | ICD-10-CM | POA: Diagnosis not present

## 2018-08-16 DIAGNOSIS — R1312 Dysphagia, oropharyngeal phase: Secondary | ICD-10-CM | POA: Diagnosis not present

## 2018-08-16 DIAGNOSIS — E43 Unspecified severe protein-calorie malnutrition: Secondary | ICD-10-CM | POA: Diagnosis not present

## 2018-08-16 DIAGNOSIS — F0390 Unspecified dementia without behavioral disturbance: Secondary | ICD-10-CM | POA: Diagnosis not present

## 2018-08-17 DIAGNOSIS — R1312 Dysphagia, oropharyngeal phase: Secondary | ICD-10-CM | POA: Diagnosis not present

## 2018-08-17 DIAGNOSIS — E43 Unspecified severe protein-calorie malnutrition: Secondary | ICD-10-CM | POA: Diagnosis not present

## 2018-08-17 DIAGNOSIS — F0390 Unspecified dementia without behavioral disturbance: Secondary | ICD-10-CM | POA: Diagnosis not present

## 2018-08-20 DIAGNOSIS — E43 Unspecified severe protein-calorie malnutrition: Secondary | ICD-10-CM | POA: Diagnosis not present

## 2018-08-20 DIAGNOSIS — R1312 Dysphagia, oropharyngeal phase: Secondary | ICD-10-CM | POA: Diagnosis not present

## 2018-08-20 DIAGNOSIS — F0390 Unspecified dementia without behavioral disturbance: Secondary | ICD-10-CM | POA: Diagnosis not present

## 2018-09-09 DIAGNOSIS — F0390 Unspecified dementia without behavioral disturbance: Secondary | ICD-10-CM | POA: Diagnosis not present

## 2018-09-09 DIAGNOSIS — G40909 Epilepsy, unspecified, not intractable, without status epilepticus: Secondary | ICD-10-CM | POA: Diagnosis not present

## 2018-09-09 DIAGNOSIS — I129 Hypertensive chronic kidney disease with stage 1 through stage 4 chronic kidney disease, or unspecified chronic kidney disease: Secondary | ICD-10-CM | POA: Diagnosis not present

## 2018-09-09 DIAGNOSIS — N183 Chronic kidney disease, stage 3 (moderate): Secondary | ICD-10-CM | POA: Diagnosis not present

## 2018-09-10 ENCOUNTER — Other Ambulatory Visit: Payer: Self-pay

## 2018-09-13 DIAGNOSIS — S72001D Fracture of unspecified part of neck of right femur, subsequent encounter for closed fracture with routine healing: Secondary | ICD-10-CM | POA: Diagnosis not present

## 2018-09-13 DIAGNOSIS — Z4789 Encounter for other orthopedic aftercare: Secondary | ICD-10-CM | POA: Diagnosis not present

## 2018-09-13 DIAGNOSIS — M6281 Muscle weakness (generalized): Secondary | ICD-10-CM | POA: Diagnosis not present

## 2018-09-13 DIAGNOSIS — R2689 Other abnormalities of gait and mobility: Secondary | ICD-10-CM | POA: Diagnosis not present

## 2018-09-14 DIAGNOSIS — M6281 Muscle weakness (generalized): Secondary | ICD-10-CM | POA: Diagnosis not present

## 2018-09-14 DIAGNOSIS — S72001D Fracture of unspecified part of neck of right femur, subsequent encounter for closed fracture with routine healing: Secondary | ICD-10-CM | POA: Diagnosis not present

## 2018-09-14 DIAGNOSIS — R2689 Other abnormalities of gait and mobility: Secondary | ICD-10-CM | POA: Diagnosis not present

## 2018-09-14 DIAGNOSIS — Z4789 Encounter for other orthopedic aftercare: Secondary | ICD-10-CM | POA: Diagnosis not present

## 2018-09-17 DIAGNOSIS — R2689 Other abnormalities of gait and mobility: Secondary | ICD-10-CM | POA: Diagnosis not present

## 2018-09-17 DIAGNOSIS — M6281 Muscle weakness (generalized): Secondary | ICD-10-CM | POA: Diagnosis not present

## 2018-09-17 DIAGNOSIS — Z4789 Encounter for other orthopedic aftercare: Secondary | ICD-10-CM | POA: Diagnosis not present

## 2018-09-17 DIAGNOSIS — S72001D Fracture of unspecified part of neck of right femur, subsequent encounter for closed fracture with routine healing: Secondary | ICD-10-CM | POA: Diagnosis not present

## 2018-09-18 DIAGNOSIS — Z4789 Encounter for other orthopedic aftercare: Secondary | ICD-10-CM | POA: Diagnosis not present

## 2018-09-18 DIAGNOSIS — M6281 Muscle weakness (generalized): Secondary | ICD-10-CM | POA: Diagnosis not present

## 2018-09-18 DIAGNOSIS — R2689 Other abnormalities of gait and mobility: Secondary | ICD-10-CM | POA: Diagnosis not present

## 2018-09-18 DIAGNOSIS — S72001D Fracture of unspecified part of neck of right femur, subsequent encounter for closed fracture with routine healing: Secondary | ICD-10-CM | POA: Diagnosis not present

## 2018-09-19 DIAGNOSIS — M6281 Muscle weakness (generalized): Secondary | ICD-10-CM | POA: Diagnosis not present

## 2018-09-19 DIAGNOSIS — Z4789 Encounter for other orthopedic aftercare: Secondary | ICD-10-CM | POA: Diagnosis not present

## 2018-09-19 DIAGNOSIS — R2689 Other abnormalities of gait and mobility: Secondary | ICD-10-CM | POA: Diagnosis not present

## 2018-09-19 DIAGNOSIS — S72001D Fracture of unspecified part of neck of right femur, subsequent encounter for closed fracture with routine healing: Secondary | ICD-10-CM | POA: Diagnosis not present

## 2018-09-20 DIAGNOSIS — Z4789 Encounter for other orthopedic aftercare: Secondary | ICD-10-CM | POA: Diagnosis not present

## 2018-09-20 DIAGNOSIS — M6281 Muscle weakness (generalized): Secondary | ICD-10-CM | POA: Diagnosis not present

## 2018-09-20 DIAGNOSIS — R2689 Other abnormalities of gait and mobility: Secondary | ICD-10-CM | POA: Diagnosis not present

## 2018-09-20 DIAGNOSIS — S72001D Fracture of unspecified part of neck of right femur, subsequent encounter for closed fracture with routine healing: Secondary | ICD-10-CM | POA: Diagnosis not present

## 2018-09-21 DIAGNOSIS — S72001D Fracture of unspecified part of neck of right femur, subsequent encounter for closed fracture with routine healing: Secondary | ICD-10-CM | POA: Diagnosis not present

## 2018-09-21 DIAGNOSIS — M6281 Muscle weakness (generalized): Secondary | ICD-10-CM | POA: Diagnosis not present

## 2018-09-21 DIAGNOSIS — R2689 Other abnormalities of gait and mobility: Secondary | ICD-10-CM | POA: Diagnosis not present

## 2018-09-21 DIAGNOSIS — Z4789 Encounter for other orthopedic aftercare: Secondary | ICD-10-CM | POA: Diagnosis not present

## 2018-09-24 DIAGNOSIS — R2689 Other abnormalities of gait and mobility: Secondary | ICD-10-CM | POA: Diagnosis not present

## 2018-09-24 DIAGNOSIS — Z4789 Encounter for other orthopedic aftercare: Secondary | ICD-10-CM | POA: Diagnosis not present

## 2018-09-24 DIAGNOSIS — S72001D Fracture of unspecified part of neck of right femur, subsequent encounter for closed fracture with routine healing: Secondary | ICD-10-CM | POA: Diagnosis not present

## 2018-09-24 DIAGNOSIS — M6281 Muscle weakness (generalized): Secondary | ICD-10-CM | POA: Diagnosis not present

## 2018-09-25 DIAGNOSIS — S72001D Fracture of unspecified part of neck of right femur, subsequent encounter for closed fracture with routine healing: Secondary | ICD-10-CM | POA: Diagnosis not present

## 2018-09-25 DIAGNOSIS — M6281 Muscle weakness (generalized): Secondary | ICD-10-CM | POA: Diagnosis not present

## 2018-09-25 DIAGNOSIS — Z4789 Encounter for other orthopedic aftercare: Secondary | ICD-10-CM | POA: Diagnosis not present

## 2018-09-25 DIAGNOSIS — R2689 Other abnormalities of gait and mobility: Secondary | ICD-10-CM | POA: Diagnosis not present

## 2018-09-26 DIAGNOSIS — S72001D Fracture of unspecified part of neck of right femur, subsequent encounter for closed fracture with routine healing: Secondary | ICD-10-CM | POA: Diagnosis not present

## 2018-09-26 DIAGNOSIS — M6281 Muscle weakness (generalized): Secondary | ICD-10-CM | POA: Diagnosis not present

## 2018-09-26 DIAGNOSIS — Z4789 Encounter for other orthopedic aftercare: Secondary | ICD-10-CM | POA: Diagnosis not present

## 2018-09-26 DIAGNOSIS — R2689 Other abnormalities of gait and mobility: Secondary | ICD-10-CM | POA: Diagnosis not present

## 2018-09-27 DIAGNOSIS — S72001D Fracture of unspecified part of neck of right femur, subsequent encounter for closed fracture with routine healing: Secondary | ICD-10-CM | POA: Diagnosis not present

## 2018-09-27 DIAGNOSIS — Z4789 Encounter for other orthopedic aftercare: Secondary | ICD-10-CM | POA: Diagnosis not present

## 2018-09-27 DIAGNOSIS — R2689 Other abnormalities of gait and mobility: Secondary | ICD-10-CM | POA: Diagnosis not present

## 2018-09-27 DIAGNOSIS — M6281 Muscle weakness (generalized): Secondary | ICD-10-CM | POA: Diagnosis not present

## 2018-09-28 DIAGNOSIS — R2689 Other abnormalities of gait and mobility: Secondary | ICD-10-CM | POA: Diagnosis not present

## 2018-09-28 DIAGNOSIS — S72001D Fracture of unspecified part of neck of right femur, subsequent encounter for closed fracture with routine healing: Secondary | ICD-10-CM | POA: Diagnosis not present

## 2018-09-28 DIAGNOSIS — M6281 Muscle weakness (generalized): Secondary | ICD-10-CM | POA: Diagnosis not present

## 2018-09-28 DIAGNOSIS — Z4789 Encounter for other orthopedic aftercare: Secondary | ICD-10-CM | POA: Diagnosis not present

## 2018-10-01 DIAGNOSIS — Z4789 Encounter for other orthopedic aftercare: Secondary | ICD-10-CM | POA: Diagnosis not present

## 2018-10-01 DIAGNOSIS — M6281 Muscle weakness (generalized): Secondary | ICD-10-CM | POA: Diagnosis not present

## 2018-10-01 DIAGNOSIS — S72001D Fracture of unspecified part of neck of right femur, subsequent encounter for closed fracture with routine healing: Secondary | ICD-10-CM | POA: Diagnosis not present

## 2018-10-01 DIAGNOSIS — R2689 Other abnormalities of gait and mobility: Secondary | ICD-10-CM | POA: Diagnosis not present

## 2018-10-02 DIAGNOSIS — R2689 Other abnormalities of gait and mobility: Secondary | ICD-10-CM | POA: Diagnosis not present

## 2018-10-02 DIAGNOSIS — S72001D Fracture of unspecified part of neck of right femur, subsequent encounter for closed fracture with routine healing: Secondary | ICD-10-CM | POA: Diagnosis not present

## 2018-10-02 DIAGNOSIS — M6281 Muscle weakness (generalized): Secondary | ICD-10-CM | POA: Diagnosis not present

## 2018-10-02 DIAGNOSIS — Z4789 Encounter for other orthopedic aftercare: Secondary | ICD-10-CM | POA: Diagnosis not present

## 2018-10-03 DIAGNOSIS — M6281 Muscle weakness (generalized): Secondary | ICD-10-CM | POA: Diagnosis not present

## 2018-10-03 DIAGNOSIS — Z4789 Encounter for other orthopedic aftercare: Secondary | ICD-10-CM | POA: Diagnosis not present

## 2018-10-03 DIAGNOSIS — S72001D Fracture of unspecified part of neck of right femur, subsequent encounter for closed fracture with routine healing: Secondary | ICD-10-CM | POA: Diagnosis not present

## 2018-10-03 DIAGNOSIS — R2689 Other abnormalities of gait and mobility: Secondary | ICD-10-CM | POA: Diagnosis not present

## 2018-10-04 DIAGNOSIS — Z4789 Encounter for other orthopedic aftercare: Secondary | ICD-10-CM | POA: Diagnosis not present

## 2018-10-04 DIAGNOSIS — R2689 Other abnormalities of gait and mobility: Secondary | ICD-10-CM | POA: Diagnosis not present

## 2018-10-04 DIAGNOSIS — S72001D Fracture of unspecified part of neck of right femur, subsequent encounter for closed fracture with routine healing: Secondary | ICD-10-CM | POA: Diagnosis not present

## 2018-10-04 DIAGNOSIS — M6281 Muscle weakness (generalized): Secondary | ICD-10-CM | POA: Diagnosis not present

## 2018-10-05 DIAGNOSIS — R2689 Other abnormalities of gait and mobility: Secondary | ICD-10-CM | POA: Diagnosis not present

## 2018-10-05 DIAGNOSIS — M6281 Muscle weakness (generalized): Secondary | ICD-10-CM | POA: Diagnosis not present

## 2018-10-05 DIAGNOSIS — Z4789 Encounter for other orthopedic aftercare: Secondary | ICD-10-CM | POA: Diagnosis not present

## 2018-10-05 DIAGNOSIS — S72001D Fracture of unspecified part of neck of right femur, subsequent encounter for closed fracture with routine healing: Secondary | ICD-10-CM | POA: Diagnosis not present

## 2018-10-08 DIAGNOSIS — R2689 Other abnormalities of gait and mobility: Secondary | ICD-10-CM | POA: Diagnosis not present

## 2018-10-08 DIAGNOSIS — S72001D Fracture of unspecified part of neck of right femur, subsequent encounter for closed fracture with routine healing: Secondary | ICD-10-CM | POA: Diagnosis not present

## 2018-10-08 DIAGNOSIS — M6281 Muscle weakness (generalized): Secondary | ICD-10-CM | POA: Diagnosis not present

## 2018-10-08 DIAGNOSIS — Z4789 Encounter for other orthopedic aftercare: Secondary | ICD-10-CM | POA: Diagnosis not present

## 2018-10-09 DIAGNOSIS — S72001D Fracture of unspecified part of neck of right femur, subsequent encounter for closed fracture with routine healing: Secondary | ICD-10-CM | POA: Diagnosis not present

## 2018-10-09 DIAGNOSIS — M6281 Muscle weakness (generalized): Secondary | ICD-10-CM | POA: Diagnosis not present

## 2018-10-09 DIAGNOSIS — R2689 Other abnormalities of gait and mobility: Secondary | ICD-10-CM | POA: Diagnosis not present

## 2018-10-09 DIAGNOSIS — Z4789 Encounter for other orthopedic aftercare: Secondary | ICD-10-CM | POA: Diagnosis not present

## 2018-10-10 DIAGNOSIS — S72001D Fracture of unspecified part of neck of right femur, subsequent encounter for closed fracture with routine healing: Secondary | ICD-10-CM | POA: Diagnosis not present

## 2018-10-10 DIAGNOSIS — Z4789 Encounter for other orthopedic aftercare: Secondary | ICD-10-CM | POA: Diagnosis not present

## 2018-10-10 DIAGNOSIS — R2689 Other abnormalities of gait and mobility: Secondary | ICD-10-CM | POA: Diagnosis not present

## 2018-10-10 DIAGNOSIS — M6281 Muscle weakness (generalized): Secondary | ICD-10-CM | POA: Diagnosis not present

## 2018-10-11 DIAGNOSIS — M6281 Muscle weakness (generalized): Secondary | ICD-10-CM | POA: Diagnosis not present

## 2018-10-11 DIAGNOSIS — S72001D Fracture of unspecified part of neck of right femur, subsequent encounter for closed fracture with routine healing: Secondary | ICD-10-CM | POA: Diagnosis not present

## 2018-10-11 DIAGNOSIS — R2689 Other abnormalities of gait and mobility: Secondary | ICD-10-CM | POA: Diagnosis not present

## 2018-10-11 DIAGNOSIS — Z4789 Encounter for other orthopedic aftercare: Secondary | ICD-10-CM | POA: Diagnosis not present

## 2018-10-12 DIAGNOSIS — R2689 Other abnormalities of gait and mobility: Secondary | ICD-10-CM | POA: Diagnosis not present

## 2018-10-12 DIAGNOSIS — M6281 Muscle weakness (generalized): Secondary | ICD-10-CM | POA: Diagnosis not present

## 2018-10-12 DIAGNOSIS — Z4789 Encounter for other orthopedic aftercare: Secondary | ICD-10-CM | POA: Diagnosis not present

## 2018-10-12 DIAGNOSIS — S72001D Fracture of unspecified part of neck of right femur, subsequent encounter for closed fracture with routine healing: Secondary | ICD-10-CM | POA: Diagnosis not present

## 2018-10-15 DIAGNOSIS — Z4789 Encounter for other orthopedic aftercare: Secondary | ICD-10-CM | POA: Diagnosis not present

## 2018-10-15 DIAGNOSIS — S72001D Fracture of unspecified part of neck of right femur, subsequent encounter for closed fracture with routine healing: Secondary | ICD-10-CM | POA: Diagnosis not present

## 2018-10-15 DIAGNOSIS — R2689 Other abnormalities of gait and mobility: Secondary | ICD-10-CM | POA: Diagnosis not present

## 2018-10-15 DIAGNOSIS — M6281 Muscle weakness (generalized): Secondary | ICD-10-CM | POA: Diagnosis not present

## 2018-10-16 DIAGNOSIS — M6281 Muscle weakness (generalized): Secondary | ICD-10-CM | POA: Diagnosis not present

## 2018-10-16 DIAGNOSIS — S72001D Fracture of unspecified part of neck of right femur, subsequent encounter for closed fracture with routine healing: Secondary | ICD-10-CM | POA: Diagnosis not present

## 2018-10-16 DIAGNOSIS — Z4789 Encounter for other orthopedic aftercare: Secondary | ICD-10-CM | POA: Diagnosis not present

## 2018-10-16 DIAGNOSIS — R2689 Other abnormalities of gait and mobility: Secondary | ICD-10-CM | POA: Diagnosis not present

## 2018-10-17 DIAGNOSIS — Z4789 Encounter for other orthopedic aftercare: Secondary | ICD-10-CM | POA: Diagnosis not present

## 2018-10-17 DIAGNOSIS — D649 Anemia, unspecified: Secondary | ICD-10-CM | POA: Diagnosis not present

## 2018-10-17 DIAGNOSIS — E039 Hypothyroidism, unspecified: Secondary | ICD-10-CM | POA: Diagnosis not present

## 2018-10-17 DIAGNOSIS — R2689 Other abnormalities of gait and mobility: Secondary | ICD-10-CM | POA: Diagnosis not present

## 2018-10-17 DIAGNOSIS — M6281 Muscle weakness (generalized): Secondary | ICD-10-CM | POA: Diagnosis not present

## 2018-10-17 DIAGNOSIS — S72001D Fracture of unspecified part of neck of right femur, subsequent encounter for closed fracture with routine healing: Secondary | ICD-10-CM | POA: Diagnosis not present

## 2018-10-18 DIAGNOSIS — S72001D Fracture of unspecified part of neck of right femur, subsequent encounter for closed fracture with routine healing: Secondary | ICD-10-CM | POA: Diagnosis not present

## 2018-10-18 DIAGNOSIS — N183 Chronic kidney disease, stage 3 (moderate): Secondary | ICD-10-CM | POA: Diagnosis not present

## 2018-10-18 DIAGNOSIS — G40909 Epilepsy, unspecified, not intractable, without status epilepticus: Secondary | ICD-10-CM | POA: Diagnosis not present

## 2018-10-18 DIAGNOSIS — D649 Anemia, unspecified: Secondary | ICD-10-CM | POA: Diagnosis not present

## 2018-10-18 DIAGNOSIS — Z4789 Encounter for other orthopedic aftercare: Secondary | ICD-10-CM | POA: Diagnosis not present

## 2018-10-18 DIAGNOSIS — M6281 Muscle weakness (generalized): Secondary | ICD-10-CM | POA: Diagnosis not present

## 2018-10-18 DIAGNOSIS — R2689 Other abnormalities of gait and mobility: Secondary | ICD-10-CM | POA: Diagnosis not present

## 2018-10-19 DIAGNOSIS — Z4789 Encounter for other orthopedic aftercare: Secondary | ICD-10-CM | POA: Diagnosis not present

## 2018-10-19 DIAGNOSIS — R2689 Other abnormalities of gait and mobility: Secondary | ICD-10-CM | POA: Diagnosis not present

## 2018-10-19 DIAGNOSIS — S72001D Fracture of unspecified part of neck of right femur, subsequent encounter for closed fracture with routine healing: Secondary | ICD-10-CM | POA: Diagnosis not present

## 2018-10-19 DIAGNOSIS — M6281 Muscle weakness (generalized): Secondary | ICD-10-CM | POA: Diagnosis not present

## 2018-10-22 DIAGNOSIS — S72001D Fracture of unspecified part of neck of right femur, subsequent encounter for closed fracture with routine healing: Secondary | ICD-10-CM | POA: Diagnosis not present

## 2018-10-22 DIAGNOSIS — R2689 Other abnormalities of gait and mobility: Secondary | ICD-10-CM | POA: Diagnosis not present

## 2018-10-22 DIAGNOSIS — M6281 Muscle weakness (generalized): Secondary | ICD-10-CM | POA: Diagnosis not present

## 2018-10-22 DIAGNOSIS — Z4789 Encounter for other orthopedic aftercare: Secondary | ICD-10-CM | POA: Diagnosis not present

## 2018-10-23 DIAGNOSIS — Z4789 Encounter for other orthopedic aftercare: Secondary | ICD-10-CM | POA: Diagnosis not present

## 2018-10-23 DIAGNOSIS — M6281 Muscle weakness (generalized): Secondary | ICD-10-CM | POA: Diagnosis not present

## 2018-10-23 DIAGNOSIS — R2689 Other abnormalities of gait and mobility: Secondary | ICD-10-CM | POA: Diagnosis not present

## 2018-10-23 DIAGNOSIS — Z20828 Contact with and (suspected) exposure to other viral communicable diseases: Secondary | ICD-10-CM | POA: Diagnosis not present

## 2018-10-23 DIAGNOSIS — S72001D Fracture of unspecified part of neck of right femur, subsequent encounter for closed fracture with routine healing: Secondary | ICD-10-CM | POA: Diagnosis not present

## 2018-10-24 DIAGNOSIS — S72001D Fracture of unspecified part of neck of right femur, subsequent encounter for closed fracture with routine healing: Secondary | ICD-10-CM | POA: Diagnosis not present

## 2018-10-24 DIAGNOSIS — R2689 Other abnormalities of gait and mobility: Secondary | ICD-10-CM | POA: Diagnosis not present

## 2018-10-24 DIAGNOSIS — M6281 Muscle weakness (generalized): Secondary | ICD-10-CM | POA: Diagnosis not present

## 2018-10-24 DIAGNOSIS — Z4789 Encounter for other orthopedic aftercare: Secondary | ICD-10-CM | POA: Diagnosis not present

## 2018-10-25 DIAGNOSIS — F0390 Unspecified dementia without behavioral disturbance: Secondary | ICD-10-CM | POA: Diagnosis not present

## 2018-10-25 DIAGNOSIS — G40909 Epilepsy, unspecified, not intractable, without status epilepticus: Secondary | ICD-10-CM | POA: Diagnosis not present

## 2018-10-25 DIAGNOSIS — Z4789 Encounter for other orthopedic aftercare: Secondary | ICD-10-CM | POA: Diagnosis not present

## 2018-10-25 DIAGNOSIS — N183 Chronic kidney disease, stage 3 (moderate): Secondary | ICD-10-CM | POA: Diagnosis not present

## 2018-10-25 DIAGNOSIS — S72001D Fracture of unspecified part of neck of right femur, subsequent encounter for closed fracture with routine healing: Secondary | ICD-10-CM | POA: Diagnosis not present

## 2018-10-25 DIAGNOSIS — R2689 Other abnormalities of gait and mobility: Secondary | ICD-10-CM | POA: Diagnosis not present

## 2018-10-25 DIAGNOSIS — M6281 Muscle weakness (generalized): Secondary | ICD-10-CM | POA: Diagnosis not present

## 2018-10-26 DIAGNOSIS — M6281 Muscle weakness (generalized): Secondary | ICD-10-CM | POA: Diagnosis not present

## 2018-10-26 DIAGNOSIS — R2689 Other abnormalities of gait and mobility: Secondary | ICD-10-CM | POA: Diagnosis not present

## 2018-10-26 DIAGNOSIS — S72001D Fracture of unspecified part of neck of right femur, subsequent encounter for closed fracture with routine healing: Secondary | ICD-10-CM | POA: Diagnosis not present

## 2018-10-26 DIAGNOSIS — Z4789 Encounter for other orthopedic aftercare: Secondary | ICD-10-CM | POA: Diagnosis not present

## 2018-10-28 DIAGNOSIS — M6281 Muscle weakness (generalized): Secondary | ICD-10-CM | POA: Diagnosis not present

## 2018-10-28 DIAGNOSIS — Z4789 Encounter for other orthopedic aftercare: Secondary | ICD-10-CM | POA: Diagnosis not present

## 2018-10-28 DIAGNOSIS — R2689 Other abnormalities of gait and mobility: Secondary | ICD-10-CM | POA: Diagnosis not present

## 2018-10-28 DIAGNOSIS — S72001D Fracture of unspecified part of neck of right femur, subsequent encounter for closed fracture with routine healing: Secondary | ICD-10-CM | POA: Diagnosis not present

## 2018-10-29 DIAGNOSIS — Z20828 Contact with and (suspected) exposure to other viral communicable diseases: Secondary | ICD-10-CM | POA: Diagnosis not present

## 2018-10-29 DIAGNOSIS — M6281 Muscle weakness (generalized): Secondary | ICD-10-CM | POA: Diagnosis not present

## 2018-10-29 DIAGNOSIS — R2689 Other abnormalities of gait and mobility: Secondary | ICD-10-CM | POA: Diagnosis not present

## 2018-10-29 DIAGNOSIS — Z4789 Encounter for other orthopedic aftercare: Secondary | ICD-10-CM | POA: Diagnosis not present

## 2018-10-29 DIAGNOSIS — S72001D Fracture of unspecified part of neck of right femur, subsequent encounter for closed fracture with routine healing: Secondary | ICD-10-CM | POA: Diagnosis not present

## 2018-10-31 DIAGNOSIS — I10 Essential (primary) hypertension: Secondary | ICD-10-CM | POA: Diagnosis not present

## 2018-10-31 DIAGNOSIS — D649 Anemia, unspecified: Secondary | ICD-10-CM | POA: Diagnosis not present

## 2018-10-31 DIAGNOSIS — R2689 Other abnormalities of gait and mobility: Secondary | ICD-10-CM | POA: Diagnosis not present

## 2018-10-31 DIAGNOSIS — Z4789 Encounter for other orthopedic aftercare: Secondary | ICD-10-CM | POA: Diagnosis not present

## 2018-10-31 DIAGNOSIS — M6281 Muscle weakness (generalized): Secondary | ICD-10-CM | POA: Diagnosis not present

## 2018-10-31 DIAGNOSIS — F329 Major depressive disorder, single episode, unspecified: Secondary | ICD-10-CM | POA: Diagnosis not present

## 2018-10-31 DIAGNOSIS — S72001D Fracture of unspecified part of neck of right femur, subsequent encounter for closed fracture with routine healing: Secondary | ICD-10-CM | POA: Diagnosis not present

## 2018-11-01 DIAGNOSIS — Z4789 Encounter for other orthopedic aftercare: Secondary | ICD-10-CM | POA: Diagnosis not present

## 2018-11-01 DIAGNOSIS — R2689 Other abnormalities of gait and mobility: Secondary | ICD-10-CM | POA: Diagnosis not present

## 2018-11-01 DIAGNOSIS — S72001D Fracture of unspecified part of neck of right femur, subsequent encounter for closed fracture with routine healing: Secondary | ICD-10-CM | POA: Diagnosis not present

## 2018-11-01 DIAGNOSIS — M6281 Muscle weakness (generalized): Secondary | ICD-10-CM | POA: Diagnosis not present

## 2018-11-02 ENCOUNTER — Other Ambulatory Visit: Payer: Self-pay

## 2018-11-02 ENCOUNTER — Ambulatory Visit: Payer: Medicare Other

## 2018-11-02 ENCOUNTER — Ambulatory Visit (INDEPENDENT_AMBULATORY_CARE_PROVIDER_SITE_OTHER): Payer: Medicare Other | Admitting: Orthopedic Surgery

## 2018-11-02 ENCOUNTER — Encounter: Payer: Self-pay | Admitting: Orthopedic Surgery

## 2018-11-02 VITALS — BP 110/56 | HR 80 | Temp 96.6°F

## 2018-11-02 DIAGNOSIS — Z9889 Other specified postprocedural states: Secondary | ICD-10-CM

## 2018-11-02 DIAGNOSIS — Z8781 Personal history of (healed) traumatic fracture: Secondary | ICD-10-CM | POA: Diagnosis not present

## 2018-11-02 DIAGNOSIS — S72001D Fracture of unspecified part of neck of right femur, subsequent encounter for closed fracture with routine healing: Secondary | ICD-10-CM | POA: Diagnosis not present

## 2018-11-02 DIAGNOSIS — R2689 Other abnormalities of gait and mobility: Secondary | ICD-10-CM | POA: Diagnosis not present

## 2018-11-02 DIAGNOSIS — Z4789 Encounter for other orthopedic aftercare: Secondary | ICD-10-CM | POA: Diagnosis not present

## 2018-11-02 DIAGNOSIS — M6281 Muscle weakness (generalized): Secondary | ICD-10-CM | POA: Diagnosis not present

## 2018-11-02 NOTE — Progress Notes (Signed)
Chief Complaint  Patient presents with  . Post-op Follow-up    Pt denies any current right hip pain s/p IM nail 04/14/18    Ms. Earles had an inotrope fracture fixed on April 14, 2018 went through normal postoperative course presents at the request of the nursing home for pain right hip and right knee and failure to ambulate but tells [place] that she feels fine other than some mild discomfort in her right thigh  Review of systems difficult to assess but apparently not having any numbness or tingling in the leg or lower back pain  Physical Exam Vitals signs and nursing note reviewed.  Constitutional:      Appearance: Normal appearance.  Musculoskeletal:     Comments: Right knee nontender but severe flexion contracture which is noted bilaterally  Right hip and thigh tenderness to palpation is mild motion is unremarkable for any discomfort  Neurological:     Mental Status: She is alert and oriented to person, place, and time.  Psychiatric:        Mood and Affect: Mood normal.    X-ray stable fixation no complications from the hardware of the fracture healed.  Diagnosis Encounter Diagnoses  Name Primary?  . S/P fixation of right hip fracture 04/14/18 IM nail  Yes  . Closed fracture of right hip with routine healing, subsequent encounter      Recommendation Recommend physical therapy but nothing else to add from orthopedic standpoint patient was not a significant ambulator prior to surgery and it is not uncommon to lose a level of ambulatory ability after hip fracture in this age group  FU prn

## 2018-11-02 NOTE — Patient Instructions (Signed)
Recommend physical therapy but nothing else to add from orthopedic standpoint patient was not a significant ambulator prior to surgery and it is not uncommon to lose a level of ambulatory ability after hip fracture in this age group  FU prn

## 2018-11-04 DIAGNOSIS — R2689 Other abnormalities of gait and mobility: Secondary | ICD-10-CM | POA: Diagnosis not present

## 2018-11-04 DIAGNOSIS — Z4789 Encounter for other orthopedic aftercare: Secondary | ICD-10-CM | POA: Diagnosis not present

## 2018-11-04 DIAGNOSIS — M6281 Muscle weakness (generalized): Secondary | ICD-10-CM | POA: Diagnosis not present

## 2018-11-04 DIAGNOSIS — S72001D Fracture of unspecified part of neck of right femur, subsequent encounter for closed fracture with routine healing: Secondary | ICD-10-CM | POA: Diagnosis not present

## 2018-11-06 DIAGNOSIS — R2689 Other abnormalities of gait and mobility: Secondary | ICD-10-CM | POA: Diagnosis not present

## 2018-11-06 DIAGNOSIS — Z4789 Encounter for other orthopedic aftercare: Secondary | ICD-10-CM | POA: Diagnosis not present

## 2018-11-06 DIAGNOSIS — S72001D Fracture of unspecified part of neck of right femur, subsequent encounter for closed fracture with routine healing: Secondary | ICD-10-CM | POA: Diagnosis not present

## 2018-11-06 DIAGNOSIS — M6281 Muscle weakness (generalized): Secondary | ICD-10-CM | POA: Diagnosis not present

## 2018-11-07 DIAGNOSIS — S72001D Fracture of unspecified part of neck of right femur, subsequent encounter for closed fracture with routine healing: Secondary | ICD-10-CM | POA: Diagnosis not present

## 2018-11-07 DIAGNOSIS — Z4789 Encounter for other orthopedic aftercare: Secondary | ICD-10-CM | POA: Diagnosis not present

## 2018-11-07 DIAGNOSIS — M6281 Muscle weakness (generalized): Secondary | ICD-10-CM | POA: Diagnosis not present

## 2018-11-07 DIAGNOSIS — R2689 Other abnormalities of gait and mobility: Secondary | ICD-10-CM | POA: Diagnosis not present

## 2018-11-13 DIAGNOSIS — Z20828 Contact with and (suspected) exposure to other viral communicable diseases: Secondary | ICD-10-CM | POA: Diagnosis not present

## 2018-11-16 DIAGNOSIS — Z20828 Contact with and (suspected) exposure to other viral communicable diseases: Secondary | ICD-10-CM | POA: Diagnosis not present

## 2018-11-21 DIAGNOSIS — Z20828 Contact with and (suspected) exposure to other viral communicable diseases: Secondary | ICD-10-CM | POA: Diagnosis not present

## 2018-11-25 DIAGNOSIS — Z20828 Contact with and (suspected) exposure to other viral communicable diseases: Secondary | ICD-10-CM | POA: Diagnosis not present

## 2018-12-01 DIAGNOSIS — Z20828 Contact with and (suspected) exposure to other viral communicable diseases: Secondary | ICD-10-CM | POA: Diagnosis not present

## 2018-12-08 DIAGNOSIS — Z20828 Contact with and (suspected) exposure to other viral communicable diseases: Secondary | ICD-10-CM | POA: Diagnosis not present

## 2018-12-10 ENCOUNTER — Encounter (HOSPITAL_COMMUNITY): Payer: Self-pay

## 2018-12-10 ENCOUNTER — Inpatient Hospital Stay (HOSPITAL_COMMUNITY)
Admission: EM | Admit: 2018-12-10 | Discharge: 2018-12-13 | DRG: 871 | Disposition: A | Discharge status: Hospice | Payer: Medicare Other | Attending: Internal Medicine | Admitting: Internal Medicine

## 2018-12-10 ENCOUNTER — Other Ambulatory Visit: Payer: Self-pay

## 2018-12-10 ENCOUNTER — Emergency Department (HOSPITAL_COMMUNITY): Payer: Medicare Other

## 2018-12-10 DIAGNOSIS — Z66 Do not resuscitate: Secondary | ICD-10-CM | POA: Diagnosis not present

## 2018-12-10 DIAGNOSIS — R5383 Other fatigue: Secondary | ICD-10-CM | POA: Diagnosis not present

## 2018-12-10 DIAGNOSIS — R6520 Severe sepsis without septic shock: Secondary | ICD-10-CM | POA: Diagnosis not present

## 2018-12-10 DIAGNOSIS — Z20828 Contact with and (suspected) exposure to other viral communicable diseases: Secondary | ICD-10-CM | POA: Diagnosis present

## 2018-12-10 DIAGNOSIS — N179 Acute kidney failure, unspecified: Secondary | ICD-10-CM | POA: Diagnosis present

## 2018-12-10 DIAGNOSIS — R1310 Dysphagia, unspecified: Secondary | ICD-10-CM | POA: Diagnosis present

## 2018-12-10 DIAGNOSIS — E860 Dehydration: Secondary | ICD-10-CM | POA: Diagnosis not present

## 2018-12-10 DIAGNOSIS — F329 Major depressive disorder, single episode, unspecified: Secondary | ICD-10-CM | POA: Diagnosis present

## 2018-12-10 DIAGNOSIS — I129 Hypertensive chronic kidney disease with stage 1 through stage 4 chronic kidney disease, or unspecified chronic kidney disease: Secondary | ICD-10-CM | POA: Diagnosis present

## 2018-12-10 DIAGNOSIS — Z7189 Other specified counseling: Secondary | ICD-10-CM | POA: Diagnosis not present

## 2018-12-10 DIAGNOSIS — D72829 Elevated white blood cell count, unspecified: Secondary | ICD-10-CM | POA: Diagnosis not present

## 2018-12-10 DIAGNOSIS — R630 Anorexia: Secondary | ICD-10-CM | POA: Diagnosis not present

## 2018-12-10 DIAGNOSIS — R531 Weakness: Secondary | ICD-10-CM | POA: Diagnosis not present

## 2018-12-10 DIAGNOSIS — N183 Chronic kidney disease, stage 3 unspecified: Secondary | ICD-10-CM | POA: Diagnosis not present

## 2018-12-10 DIAGNOSIS — Z515 Encounter for palliative care: Secondary | ICD-10-CM | POA: Diagnosis not present

## 2018-12-10 DIAGNOSIS — E870 Hyperosmolality and hypernatremia: Secondary | ICD-10-CM | POA: Diagnosis not present

## 2018-12-10 DIAGNOSIS — A419 Sepsis, unspecified organism: Principal | ICD-10-CM | POA: Diagnosis present

## 2018-12-10 DIAGNOSIS — G4700 Insomnia, unspecified: Secondary | ICD-10-CM | POA: Diagnosis present

## 2018-12-10 DIAGNOSIS — E43 Unspecified severe protein-calorie malnutrition: Secondary | ICD-10-CM | POA: Diagnosis not present

## 2018-12-10 DIAGNOSIS — Z79899 Other long term (current) drug therapy: Secondary | ICD-10-CM

## 2018-12-10 DIAGNOSIS — R410 Disorientation, unspecified: Secondary | ICD-10-CM | POA: Diagnosis not present

## 2018-12-10 DIAGNOSIS — G92 Toxic encephalopathy: Secondary | ICD-10-CM | POA: Diagnosis present

## 2018-12-10 DIAGNOSIS — N171 Acute kidney failure with acute cortical necrosis: Secondary | ICD-10-CM | POA: Diagnosis not present

## 2018-12-10 DIAGNOSIS — D631 Anemia in chronic kidney disease: Secondary | ICD-10-CM | POA: Diagnosis present

## 2018-12-10 DIAGNOSIS — N1832 Chronic kidney disease, stage 3b: Secondary | ICD-10-CM | POA: Diagnosis not present

## 2018-12-10 DIAGNOSIS — N390 Urinary tract infection, site not specified: Secondary | ICD-10-CM | POA: Diagnosis not present

## 2018-12-10 DIAGNOSIS — F0390 Unspecified dementia without behavioral disturbance: Secondary | ICD-10-CM | POA: Diagnosis present

## 2018-12-10 DIAGNOSIS — R627 Adult failure to thrive: Secondary | ICD-10-CM | POA: Diagnosis present

## 2018-12-10 DIAGNOSIS — K921 Melena: Secondary | ICD-10-CM | POA: Diagnosis not present

## 2018-12-10 DIAGNOSIS — G40909 Epilepsy, unspecified, not intractable, without status epilepticus: Secondary | ICD-10-CM | POA: Diagnosis present

## 2018-12-10 HISTORY — DX: Unspecified fracture of unspecified femur, initial encounter for closed fracture: S72.90XA

## 2018-12-10 HISTORY — DX: Failure to thrive (child): R62.51

## 2018-12-10 HISTORY — DX: Disorder of kidney and ureter, unspecified: N28.9

## 2018-12-10 HISTORY — DX: Difficulty in walking, not elsewhere classified: R26.2

## 2018-12-10 HISTORY — DX: Dysphagia, unspecified: R13.10

## 2018-12-10 HISTORY — DX: Unspecified protein-calorie malnutrition: E46

## 2018-12-10 LAB — MAGNESIUM: Magnesium: 2.6 mg/dL — ABNORMAL HIGH (ref 1.7–2.4)

## 2018-12-10 LAB — CBC WITH DIFFERENTIAL/PLATELET
Abs Immature Granulocytes: 0.26 10*3/uL — ABNORMAL HIGH (ref 0.00–0.07)
Basophils Absolute: 0.1 10*3/uL (ref 0.0–0.1)
Basophils Relative: 0 %
Eosinophils Absolute: 0 10*3/uL (ref 0.0–0.5)
Eosinophils Relative: 0 %
HCT: 38.9 % (ref 36.0–46.0)
Hemoglobin: 12.2 g/dL (ref 12.0–15.0)
Immature Granulocytes: 1 %
Lymphocytes Relative: 5 %
Lymphs Abs: 1.3 10*3/uL (ref 0.7–4.0)
MCH: 30.8 pg (ref 26.0–34.0)
MCHC: 31.4 g/dL (ref 30.0–36.0)
MCV: 98.2 fL (ref 80.0–100.0)
Monocytes Absolute: 1.4 10*3/uL — ABNORMAL HIGH (ref 0.1–1.0)
Monocytes Relative: 5 %
Neutro Abs: 26.2 10*3/uL — ABNORMAL HIGH (ref 1.7–7.7)
Neutrophils Relative %: 89 %
Platelets: 437 10*3/uL — ABNORMAL HIGH (ref 150–400)
RBC: 3.96 MIL/uL (ref 3.87–5.11)
RDW: 13.2 % (ref 11.5–15.5)
WBC: 29.2 10*3/uL — ABNORMAL HIGH (ref 4.0–10.5)
nRBC: 0 % (ref 0.0–0.2)

## 2018-12-10 LAB — BASIC METABOLIC PANEL
Anion gap: 15 (ref 5–15)
BUN: 88 mg/dL — ABNORMAL HIGH (ref 8–23)
CO2: 26 mmol/L (ref 22–32)
Calcium: 9 mg/dL (ref 8.9–10.3)
Chloride: 106 mmol/L (ref 98–111)
Creatinine, Ser: 2.1 mg/dL — ABNORMAL HIGH (ref 0.44–1.00)
GFR calc Af Amer: 24 mL/min — ABNORMAL LOW (ref >60)
GFR calc non Af Amer: 21 mL/min — ABNORMAL LOW (ref >60)
Glucose, Bld: 150 mg/dL — ABNORMAL HIGH (ref 70–99)
Potassium: 3.5 mmol/L (ref 3.5–5.1)
Sodium: 147 mmol/L — ABNORMAL HIGH (ref 135–145)

## 2018-12-10 LAB — PHOSPHORUS: Phosphorus: 4.9 mg/dL — ABNORMAL HIGH (ref 2.5–4.6)

## 2018-12-10 LAB — URINALYSIS, ROUTINE W REFLEX MICROSCOPIC
Bilirubin Urine: NEGATIVE
Glucose, UA: NEGATIVE mg/dL
Hgb urine dipstick: NEGATIVE
Ketones, ur: NEGATIVE mg/dL
Nitrite: NEGATIVE
Protein, ur: NEGATIVE mg/dL
Specific Gravity, Urine: 1.015 (ref 1.005–1.030)
pH: 5 (ref 5.0–8.0)

## 2018-12-10 LAB — HEMOGLOBIN A1C
Hgb A1c MFr Bld: 5.7 % — ABNORMAL HIGH (ref 4.8–5.6)
Mean Plasma Glucose: 116.89 mg/dL

## 2018-12-10 MED ORDER — SODIUM CHLORIDE 0.45 % IV SOLN: INTRAVENOUS | Status: DC

## 2018-12-10 MED ORDER — POTASSIUM CHLORIDE IN NACL 20-0.45 MEQ/L-% IV SOLN
INTRAVENOUS | Status: DC
  Administered 2018-12-10: 23:00:00 via INTRAVENOUS
  Filled 2018-12-10 (×2): qty 1000

## 2018-12-10 MED ORDER — LEVETIRACETAM 100 MG/ML PO SOLN
250.0000 mg | Freq: Two times a day (BID) | ORAL | Status: DC
  Administered 2018-12-10 – 2018-12-12 (×3): 250 mg via ORAL
  Filled 2018-12-10 (×8): qty 2.5

## 2018-12-10 MED ORDER — DONEPEZIL HCL 5 MG PO TABS
10.0000 mg | ORAL_TABLET | Freq: Every day | ORAL | Status: DC
  Filled 2018-12-10 (×2): qty 2
  Filled 2018-12-10 (×2): qty 1
  Filled 2018-12-10: qty 2

## 2018-12-10 MED ORDER — ACETAMINOPHEN 325 MG PO TABS: 650.0000 mg | ORAL_TABLET | Freq: Four times a day (QID) | ORAL | Status: DC | PRN

## 2018-12-10 MED ORDER — HEPARIN SODIUM (PORCINE) 5000 UNIT/ML IJ SOLN
5000.0000 [IU] | Freq: Three times a day (TID) | INTRAMUSCULAR | Status: DC
  Administered 2018-12-10 – 2018-12-13 (×8): 5000 [IU] via SUBCUTANEOUS
  Filled 2018-12-10 (×7): qty 1

## 2018-12-10 MED ORDER — ONDANSETRON HCL 4 MG PO TABS: 4.0000 mg | ORAL_TABLET | Freq: Four times a day (QID) | ORAL | Status: DC | PRN

## 2018-12-10 MED ORDER — ONDANSETRON HCL 4 MG/2ML IJ SOLN: 4.0000 mg | Freq: Four times a day (QID) | INTRAMUSCULAR | Status: DC | PRN

## 2018-12-10 MED ORDER — TRAZODONE HCL 50 MG PO TABS
50.0000 mg | ORAL_TABLET | Freq: Every day | ORAL | Status: DC
  Filled 2018-12-10 (×2): qty 1

## 2018-12-10 MED ORDER — SODIUM CHLORIDE 0.9 % IV SOLN
1.0000 g | INTRAVENOUS | Status: DC
  Administered 2018-12-11 – 2018-12-12 (×2): 1 g via INTRAVENOUS
  Filled 2018-12-10 (×2): qty 10

## 2018-12-10 MED ORDER — SODIUM CHLORIDE 0.9 % IV BOLUS
1000.0000 mL | Freq: Once | INTRAVENOUS | Status: AC
  Administered 2018-12-10: 1000 mL via INTRAVENOUS

## 2018-12-10 MED ORDER — SODIUM CHLORIDE 0.9 % IV SOLN
1.0000 g | Freq: Once | INTRAVENOUS | Status: AC
  Administered 2018-12-10: 1 g via INTRAVENOUS
  Filled 2018-12-10: qty 10

## 2018-12-10 NOTE — H&P (Signed)
History and Physical    Tina Kline OTL:[id number] DOB: 10/17/1929 DOA: 12/10/2018  Referring MD/NP/PA: Dr. Lacinda Axon PCP: [name], MD  Patient coming from: [facility] skilled nursing facility.  Chief Complaint: Worsening mentation, poor oral intake/dehydration, weakness and lethargy.  HPI: Tina Kline is a 85 y.o. female with a past medical history significant for dementia, seizure disorder, hypertension, dysphagia, chronic kidney disease stage III, anemia of chronic kidney disease and severe protein calorie malnutrition; who was sent from the skilled nursing facility secondary to increase somnolence/lethargy and poor oral intake.  Patient symptoms have been present for the last 2 to 3 days prior to admission.  There has not been any reports of fever, sick contacts, diarrhea, hematuria, hematochezia, abdominal pain, chest pain, shortness of breath, melena, hematochezia, vomiting, focal weakness or any other complaints. In the ED patient was found tachycardic, with significant elevation of her WBCs in the 30,000 range, at their urine suggesting UTI, acute on chronic renal failure with a creatinine up to 2.10 from 1.3 at baseline; and also hypernatremia.  Patient met sepsis criteria, received 1 bolus of normal saline, cultures taken and patient started on antibiotics.  TR[facility] has been consulted to admit patient for further evaluation and management of sepsis due to UTI, dehydration and acute on chronic renal failure.  Past Medical/Surgical History: Past Medical History:  Diagnosis Date  . Anemia   . Dementia ([place])   . Difficulty in walking   . Dysphagia   . Failure to thrive (0-17)   . Femur fracture ([place])   . Hypertension   . Protein calorie malnutrition ([place])   . Renal disorder   . Seizures ([place])     Past Surgical History:  Procedure Laterality Date  . APPENDECTOMY    . BIOPSY  06/22/2017   Procedure: BIOPSY;  Surgeon: Daneil Dolin, MD;  [facility];  Service:  Endoscopy;;  gastric   . COLONOSCOPY N/A 06/26/2015   Dr. Oneida Alar: Single tubular adenoma removed.  No future colonoscopies for surveillance purposes given age.  . ESOPHAGEAL DILATION N/A 06/22/2017   Procedure: ESOPHAGEAL DILATION;  Surgeon: Daneil Dolin, MD;  [facility];  Service: Endoscopy;  Laterality: N/A;  . ESOPHAGOGASTRODUODENOSCOPY N/A 01/29/2016   Dr. Oneida Alar: proximal esophageal web s/p dilation, large hiatal hernia, gastritis  . ESOPHAGOGASTRODUODENOSCOPY N/A 03/08/2016   Dr. Oneida Alar: proximal esophagel web s/p dilation, gastritis, moderate hiatal hernia  . ESOPHAGOGASTRODUODENOSCOPY  06/2015   Dr. Oneida Alar: Large hiatal hernia, gastritis, proximal esophageal web status post dilation  . ESOPHAGOGASTRODUODENOSCOPY (EGD) WITH PROPOFOL N/A 06/22/2017   Procedure: ESOPHAGOGASTRODUODENOSCOPY (EGD) WITH PROPOFOL;  Surgeon: Daneil Dolin, MD;  [facility];  Service: Endoscopy;  Laterality: N/A;  . INTRAMEDULLARY (IM) NAIL INTERTROCHANTERIC Right 04/14/2018   Procedure: INTRAMEDULLARY (IM) NAIL INTERTROCHANTRIC;  Surgeon: Carole Civil, MD;  Location: [facility];  Service: Orthopedics;  Laterality: Right;  1000 am  . MOUTH SURGERY    . SAVORY DILATION N/A 01/29/2016   Procedure: SAVORY DILATION;  Surgeon: Danie Binder, MD;  [facility];  Service: Endoscopy;  Laterality: N/A;  . SAVORY DILATION N/A 03/08/2016   Procedure: SAVORY DILATION;  Surgeon: Danie Binder, MD;  [facility];  Service: Endoscopy;  Laterality: N/A;    Social History:  reports that she has never smoked. She has never used smokeless tobacco. She reports that she does not drink alcohol or use drugs.  Allergies: No Known Allergies  Family History:  Family History  Problem Relation Age of Onset  . Breast cancer Daughter   . Pancreatic cancer Son   . Colon cancer Son     Prior to Admission medications   Medication Sig Start Date End Date Taking? Authorizing  Provider  acetaminophen (TYLENOL) 325 MG tablet Take 650 mg by mouth every 8 (eight) hours.    Yes [provider]  donepezil (ARICEPT) 10 MG tablet Take 10 mg by mouth at bedtime.   Yes [provider]  ferrous sulfate 325 (65 FE) MG EC tablet Take 1 tablet (325 mg total) by mouth 2 (two) times daily. 06/23/17 12/10/18 Yes Erline Hau, MD  levETIRAcetam (KEPPRA) 100 MG/ML solution Take 250 mg by mouth 2 (two) times daily.  12/10/18  Yes [provider]  senna (SENOKOT) 8.6 MG TABS tablet Take 1 tablet by mouth 2 (two) times daily.   Yes [provider]  torsemide (DEMADEX) 20 MG tablet Take 1 tablet (20 mg total) by mouth daily. 04/17/18  Yes Barton Dubois, MD  traZODone (DESYREL) 50 MG tablet Take 0.5 tablets (25 mg total) by mouth at bedtime. Patient taking differently: Take 50 mg by mouth at bedtime.  01/03/18  Yes Barton Dubois, MD  vitamin C (ASCORBIC ACID) 500 MG tablet Take 500 mg by mouth 2 (two) times daily.   Yes [provider]    Review of Systems:  Difficult to assess given patient dementia and worsening AMS; but per chart review and SNF reports Neg except as otherwise mentioned on HPI.Marland Kitchen   Physical Exam: Vitals:   12/10/18 1511 12/10/18 1601 12/10/18 1630 12/10/18 1730  BP: 109/74 124/90 120/81 (!) 100/50  Pulse: (!) 106     Resp:  _0 Temp:      TempSrc:      SpO2: 99%       Constitutional: afebrile currently, disoriented/confused, frail and chronically ill in appearance.  Eyes: PERRL, lids and conjunctivae normal, no icterus. ENMT: Mucous membranes are moist. Posterior pharynx clear of any exudate or lesions.Normal dentition.  Neck: normal, supple, no masses, no thyromegaly, no JVD. Respiratory: clear to auscultation bilaterally, no wheezing, no crackles. Normal respiratory effort. No accessory muscle use.  Cardiovascular: sinus tachycardia, no murmurs / rubs / gallops. No extremity edema. 2+ pedal pulses.  No carotid bruits.  Abdomen: no tenderness, no masses palpated. No hepatosplenomegaly. Bowel sounds positive.  Musculoskeletal: no clubbing / cyanosis. No joint deformity upper and lower extremities. Good ROM, no contractures. Normal muscle tone.  Skin: no rashes, lesions, ulcers. No induration Neurologic: CN 2-12 grossly intact. Poor effort and MS 4/5 bilaterally.  Psychiatric:  Normal mood. Disoriented and demonstrating poor insight.    Labs on Admission: I have personally reviewed the following labs and imaging studies  CBC: Recent Labs  Lab 12/10/18 1248  WBC 29.2*  NEUTROABS 26.2*  HGB 12.2  HCT 38.9  MCV 98.2  PLT 676*   Basic Metabolic Panel: Recent Labs  Lab 12/10/18 1248  NA 147*  K 3.5  CL 106  CO2 26  GLUCOSE 150*  BUN 88*  CREATININE 2.10*  CALCIUM 9.0   GFR: CrCl cannot be calculated (Unknown ideal weight.).  Urine analysis:    Component Value Date/Time   COLORURINE YELLOW 12/10/2018 1248   APPEARANCEUR HAZY (A) 12/10/2018 1248   LABSPEC 1.015 12/10/2018 1248   PHURINE 5.0 12/10/2018 1248   GLUCOSEU NEGATIVE 12/10/2018 1248   HGBUR NEGATIVE 12/10/2018 [place] 12/10/2018  [place] 12/10/2018 [place] 12/10/2018 1248   UROBILINOGEN 0.2 01/15/2013 0425   NITRITE NEGATIVE 12/10/2018 1248   LEUKOCYTESUR MODERATE (A) 12/10/2018 1248    Radiological Exams on Admission: Dg Chest Port 1 View  Result Date: 12/10/2018 CLINICAL DATA:  WEAKNESS, PER ER NOTE, [facility] staff reports pt has been lethargic and not eating or drinking much for the past couple of days. REports they attempted to get blood work to check labs but was unable. ALso reports attempted i.*comment was truncated*weakness EXAM: PORTABLE CHEST 1 VIEW COMPARISON:  Radiograph 04/13/2018 FINDINGS: Normal cardiac silhouette with ectatic aorta. Lungs are hyperinflated. No effusion, or infiltrate pneumothorax. IMPRESSION: No acute cardiopulmonary  process. Electronically Signed   By: Suzy Bouchard M.D.   On: 12/10/2018 14:08    EKG: none   Assessment/Plan 1-Sepsis ([place]): in the setting of UTI; patient tachycardic, with elevated WBC's on presentation, with affected renal function, dirty UA suggesting source and with a component of encephalopathy. -will admit to med-surg -started on IVF's -follow culture results -started on IV antibiotics  -follow renal function and WBC's trend  -provide supportive care and follow clinical response.  2-acute encephalopathy -toxic-metabolic in nature -due to hypernatremia and UTI -will continue IVF's and IV antibiotics -follow clinical response -if failed to improve will check CT head; currently w/o focal deficit -patient with dementia at baseline.  3-Dysphagia -patient followed puree diet at baseline -will use full liquid diet initially -follow clinical response.  4-Dementia ([place]) -will continue aricept -continue supportive care  5-Acute renal failure superimposed on stage 3 chronic kidney disease ([place]) -stage b at baseline -pre-renal in nature from dehydration and continue use of diuretics most likely -UTI also playing a role -continue IVF's -minimize/avoid nephrotoxic agents -continue tx for infection -follow renal function trend   6-dehydration hypernatremia -will provide IVF's and follow electrolytes trend   7-seizure disorder -will continue tx with keppra.  8-insomnia -continue QHS trazodone  9-severe protein calorie malnutrition -will have nutritional service consulted to assist with feeding supplements     Dehydration   Hypernatremia     DVT prophylaxis: heparin  Code Status: Full Family Communication: no family at bedside. Disposition Plan: to be determine; hopefully discharge back to SNF once acute infection clear and patient able to maintain adequate nutrition and hydration. Consults called: none  Admission status: inpatient, LOS more than 2 midnights;  med-surg bed.   Time Spent: 70 minutes.  Barton Dubois MD [facility] Pager [phone]   12/10/2018, 6:57 PM

## 2018-12-10 NOTE — ED Triage Notes (Signed)
[facility] staff reports pt has been lethargic and not eating or drinking much for the past couple of days.  REports they attempted to get blood work to check labs but was unable.  ALso reports attempted in and out cath and per staff, as soon as the cath touched pt, bright red blood returned so unsure of where the blood came from.  Reports pt is usually pleasantly confused but has been more lethargic over the weekend.

## 2018-12-10 NOTE — ED Provider Notes (Addendum)
[facility] EMERGENCY DEPARTMENT Provider Note   CSN: [id number] Arrival date & time: 12/10/18  1152     History   Chief Complaint Chief Complaint  Patient presents with  . Fatigue    HPI Tina Kline is a 85 y.o. female.     Level 5 caveat for dementia.  Chief complaint fatigue, weakness, decreased oral intake for the past several days.  Family is concerned about a urinary tract infection.  Patient resides at [facility].     Past Medical History:  Diagnosis Date  . Anemia   . Dementia ([place])   . Difficulty in walking   . Dysphagia   . Failure to thrive (0-17)   . Femur fracture ([place])   . Hypertension   . Protein calorie malnutrition ([place])   . Renal disorder   . Seizures [facility])     Patient Active Problem List   Diagnosis Date Noted  . S/P fixation of right hip fracture 04/14/18 IM nail  04/30/2018  . Closed fracture of right hip ([place])   . Femur fracture, right ([place]) 04/13/2018  . Protein-calorie malnutrition, severe 01/03/2018  . Benign essential HTN   . Failure to thrive in adult 01/02/2018  . Acute renal failure superimposed on stage 3 chronic kidney disease ([place]) 01/02/2018  . Goals of care, counseling/discussion 01/02/2018  . Dehydration   . Hyponatremia   . Syncope and collapse   . Palliative care by specialist   . Dementia ([place]) 01/01/2018  . H. pylori infection 09/27/2017  . Dark stools 09/27/2017  . Syncope 06/20/2017  . Anemia   . Dysphagia 01/20/2016  . IDA (iron deficiency anemia) 06/08/2015  . Chronic renal disease 06/08/2015  . Arthritis of knee, degenerative 06/13/2013    Past Surgical History:  Procedure Laterality Date  . APPENDECTOMY    . BIOPSY  06/22/2017   Procedure: BIOPSY;  Surgeon: Daneil Dolin, MD;  [facility];  Service: Endoscopy;;  gastric   . COLONOSCOPY N/A 06/26/2015   Dr. Oneida Alar: Single tubular adenoma removed.  No future colonoscopies for surveillance purposes given age.  . ESOPHAGEAL  DILATION N/A 06/22/2017   Procedure: ESOPHAGEAL DILATION;  Surgeon: Daneil Dolin, MD;  [facility];  Service: Endoscopy;  Laterality: N/A;  . ESOPHAGOGASTRODUODENOSCOPY N/A 01/29/2016   Dr. Oneida Alar: proximal esophageal web s/p dilation, large hiatal hernia, gastritis  . ESOPHAGOGASTRODUODENOSCOPY N/A 03/08/2016   Dr. Oneida Alar: proximal esophagel web s/p dilation, gastritis, moderate hiatal hernia  . ESOPHAGOGASTRODUODENOSCOPY  06/2015   Dr. Oneida Alar: Large hiatal hernia, gastritis, proximal esophageal web status post dilation  . ESOPHAGOGASTRODUODENOSCOPY (EGD) WITH PROPOFOL N/A 06/22/2017   Procedure: ESOPHAGOGASTRODUODENOSCOPY (EGD) WITH PROPOFOL;  Surgeon: Daneil Dolin, MD;  [facility];  Service: Endoscopy;  Laterality: N/A;  . INTRAMEDULLARY (IM) NAIL INTERTROCHANTERIC Right 04/14/2018   Procedure: INTRAMEDULLARY (IM) NAIL INTERTROCHANTRIC;  Surgeon: Carole Civil, MD;  Location: [facility];  Service: Orthopedics;  Laterality: Right;  1000 am  . MOUTH SURGERY    . SAVORY DILATION N/A 01/29/2016   Procedure: SAVORY DILATION;  Surgeon: Danie Binder, MD;  Location[facility];  Service: Endoscopy;  Laterality: N/A;  . SAVORY DILATION N/A 03/08/2016   Procedure: SAVORY DILATION;  Surgeon: Danie Binder, MD;  [facility];  Service: Endoscopy;  Laterality: N/A;     OB History    Gravida  6   Para  6   Term  6   Preterm  AB      Living  4     SAB      TAB      Ectopic      Multiple      Live Births               Home Medications    Prior to Admission medications   Medication Sig Start Date End Date Taking? Authorizing Provider  acetaminophen (TYLENOL) 325 MG tablet Take 650 mg by mouth every 6 (six) hours as needed for mild pain or headache.     [provider]  docusate sodium (COLACE) 100 MG capsule Take 1 capsule (100 mg total) by mouth 2 (two) times daily. 04/16/18   Barton Dubois, MD  donepezil (ARICEPT) 10  MG tablet Take 10 mg by mouth at bedtime.    [provider]  ferrous sulfate 325 (65 FE) MG EC tablet Take 1 tablet (325 mg total) by mouth 2 (two) times daily. 06/23/17 06/23/18  Isaac Bliss, Rayford Halsted, MD  torsemide (DEMADEX) 20 MG tablet Take 1 tablet (20 mg total) by mouth daily. 04/17/18   Barton Dubois, MD  traZODone (DESYREL) 50 MG tablet Take 0.5 tablets (25 mg total) by mouth at bedtime. 01/03/18   Barton Dubois, MD  vitamin C (ASCORBIC ACID) 500 MG tablet Take 500 mg by mouth 2 (two) times daily.    [provider]    Family History Family History  Problem Relation Age of Onset  . Breast cancer Daughter   . Pancreatic cancer Son   . Colon cancer Son     Social History Social History   Tobacco Use  . Smoking status: Never Smoker  . Smokeless tobacco: Never Used  Substance Use Topics  . Alcohol use: No  . Drug use: No     Allergies   Patient has no known allergies.   Review of Systems Review of Systems  Unable to perform ROS: Dementia     Physical Exam Updated Vital Signs BP 126/64   Pulse (!) 104   Temp 98.1 F (36.7 C) (Oral)   Resp 18   SpO2 100%   Physical Exam Vitals signs and nursing note reviewed.  Constitutional:      Appearance: She is well-developed.     Comments: Frail, dehydrated  HENT:     Head: Normocephalic and atraumatic.  Eyes:     Conjunctiva/sclera: Conjunctivae normal.  Neck:     Musculoskeletal: Neck supple.  Cardiovascular:     Rate and Rhythm: Normal rate and regular rhythm.  Pulmonary:     Effort: Pulmonary effort is normal.     Breath sounds: Normal breath sounds.  Abdominal:     General: Bowel sounds are normal.     Palpations: Abdomen is soft.  Musculoskeletal: Normal range of motion.  Skin:    General: Skin is warm and dry.  Neurological:     General: No focal deficit present.  Psychiatric:     Comments: Pleasant, demented      ED Treatments / Results  Labs (all labs ordered are  listed, but only abnormal results are displayed) Labs Reviewed  CBC WITH DIFFERENTIAL/PLATELET - Abnormal; Notable for the following components:      Result Value   WBC 29.2 (*)    Platelets 437 (*)    Neutro Abs 26.2 (*)    Monocytes Absolute 1.4 (*)    Abs Immature Granulocytes 0.26 (*)    All other components within normal limits  BASIC METABOLIC PANEL - Abnormal; Notable for the following components:   Sodium 147 (*)    Glucose, Bld 150 (*)    BUN 88 (*)    Creatinine, Ser 2.10 (*)    GFR calc non Af Amer 21 (*)    GFR calc Af Amer 24 (*)    All other components within normal limits  URINALYSIS, ROUTINE W REFLEX MICROSCOPIC - Abnormal; Notable for the following components:   APPearance HAZY (*)    Leukocytes,Ua MODERATE (*)    Bacteria, UA RARE (*)    All other components within normal limits  NOVEL CORONAVIRUS, NAA (HOSP ORDER, SEND-OUT TO REF LAB; TAT 18-24 HRS)  URINE CULTURE    EKG None  Radiology Dg Chest Port 1 View  Result Date: 12/10/2018 CLINICAL DATA:  WEAKNESS, PER ER NOTE, [facility] staff reports pt has been lethargic and not eating or drinking much for the past couple of days. REports they attempted to get blood work to check labs but was unable. ALso reports attempted i.*comment was truncated*weakness EXAM: PORTABLE CHEST 1 VIEW COMPARISON:  Radiograph 04/13/2018 FINDINGS: Normal cardiac silhouette with ectatic aorta. Lungs are hyperinflated. No effusion, or infiltrate pneumothorax. IMPRESSION: No acute cardiopulmonary process. Electronically Signed   By: Suzy Bouchard M.D.   On: 12/10/2018 14:08    Procedures Procedures (including critical care time)  Medications Ordered in ED Medications  cefTRIAXone (ROCEPHIN) 1 g in sodium chloride 0.9 % 100 mL IVPB (has no administration in time range)  sodium chloride 0.9 % bolus 1,000 mL (1,000 mLs Intravenous New Bag/Given 12/10/18 1306)     Initial Impression / Assessment and Plan / ED Course  I have reviewed  the triage vital signs and the nursing notes.  Pertinent labs & imaging results that were available during my care of the patient were reviewed by me and considered in my medical decision making (see chart for details).        Family reports increased lethargy and weakness.  White count 29 K.  Creatinine and sodium slightly bumped.  Urinalysis shows evidence of infection.  Will hydrate, IV Rocephin, urine culture, admit.   CRITICAL CARE Performed by: Nat Christen Total critical care time: 30 minutes Critical care time was exclusive of separately billable procedures and treating other patients. Critical care was necessary to treat or prevent imminent or life-threatening deterioration. Critical care was time spent personally by me on the following activities: development of treatment plan with patient and/or surrogate as well as nursing, discussions with consultants, evaluation of patient's response to treatment, examination of patient, obtaining history from patient or surrogate, ordering and performing treatments and interventions, ordering and review of laboratory studies, ordering and review of radiographic studies, pulse oximetry and re-evaluation of patient's condition.  Final Clinical Impressions(s) / ED Diagnoses   Final diagnoses:  Urinary tract infection without hematuria, site unspecified  Leukocytosis, unspecified type  Weakness  AKI (acute kidney injury) ([place])  Hypernatremia    ED Discharge Orders    None       Nat Christen, MD 12/10/18 1423    Nat Christen, MD 12/10/18 1443

## 2018-12-11 ENCOUNTER — Encounter (HOSPITAL_COMMUNITY): Payer: Self-pay | Admitting: Primary Care

## 2018-12-11 DIAGNOSIS — Z7189 Other specified counseling: Secondary | ICD-10-CM

## 2018-12-11 DIAGNOSIS — Z515 Encounter for palliative care: Secondary | ICD-10-CM

## 2018-12-11 DIAGNOSIS — D72829 Elevated white blood cell count, unspecified: Secondary | ICD-10-CM

## 2018-12-11 DIAGNOSIS — N390 Urinary tract infection, site not specified: Secondary | ICD-10-CM

## 2018-12-11 LAB — URINE CULTURE: Culture: NO GROWTH

## 2018-12-11 LAB — BASIC METABOLIC PANEL
Anion gap: 18 — ABNORMAL HIGH (ref 5–15)
BUN: 92 mg/dL — ABNORMAL HIGH (ref 8–23)
CO2: 21 mmol/L — ABNORMAL LOW (ref 22–32)
Calcium: 8.7 mg/dL — ABNORMAL LOW (ref 8.9–10.3)
Chloride: 111 mmol/L (ref 98–111)
Creatinine, Ser: 1.82 mg/dL — ABNORMAL HIGH (ref 0.44–1.00)
GFR calc Af Amer: 28 mL/min — ABNORMAL LOW (ref >60)
GFR calc non Af Amer: 25 mL/min — ABNORMAL LOW (ref >60)
Glucose, Bld: 129 mg/dL — ABNORMAL HIGH (ref 70–99)
Potassium: 3.2 mmol/L — ABNORMAL LOW (ref 3.5–5.1)
Sodium: 150 mmol/L — ABNORMAL HIGH (ref 135–145)

## 2018-12-11 LAB — NOVEL CORONAVIRUS, NAA (HOSP ORDER, SEND-OUT TO REF LAB; TAT 18-24 HRS): SARS-CoV-2, NAA: NOT DETECTED

## 2018-12-11 MED ORDER — ENSURE ENLIVE PO LIQD
237.0000 mL | Freq: Two times a day (BID) | ORAL | Status: DC
  Administered 2018-12-11 – 2018-12-12 (×2): 237 mL via ORAL

## 2018-12-11 MED ORDER — INFLUENZA VAC A&B SA ADJ QUAD 0.5 ML IM PRSY: 0.5000 mL | PREFILLED_SYRINGE | INTRAMUSCULAR | Status: DC | PRN

## 2018-12-11 MED ORDER — POTASSIUM CL IN DEXTROSE 5% 20 MEQ/L IV SOLN
20.0000 meq | INTRAVENOUS | Status: DC
  Administered 2018-12-11: 20 meq via INTRAVENOUS
  Filled 2018-12-11 (×5): qty 1000

## 2018-12-11 NOTE — Progress Notes (Signed)
Initial Nutrition Assessment  DOCUMENTATION CODES:   Not applicable  INTERVENTION:  -Ensure Enlive po BID, each supplement provides 350 kcal and 20 grams of protein (chocolate) -Magic cup TID with meals, each supplement provides 290 kcal and 9 grams of protein  -Recommend obtaining current weight to fully assess needs  NUTRITION DIAGNOSIS:   Inadequate oral intake related to acute illness(sepsis related to UTI) as evidenced by meal completion < 25%, energy intake < 75% for > 7 days, per patient/family report.  GOAL:   Patient will meet greater than or equal to 90% of their needs   MONITOR:   PO intake, Supplement acceptance, I & O's, Labs, Weight trends  REASON FOR ASSESSMENT:   Consult Assessment of nutrition requirement/status  ASSESSMENT:   85 year old female with past medical history significant for dementia, seizure disorder, HTN, dysphagia, CKD3, anemia of CKD who presented from SNF secondary to increased somnolence/lethargy and poor oral intake over the past 2-3 days.  Patient admitted with sepsis secondary to UTI  Patient on puree diet at facility, currently on full liquids. Patient sleeping at time of visit. Information provided by patient's daughter who was present in room. Daughter reports that she visits her mother daily outside the window of [facility] facility. Daughter stated that she has noticed her mother to be less engaged during visits over the past week and contacted facility with concerns. Daughter stated that patient did not want to eat lunch today and has had decreased intake and appetite per report of the facility. Daughter endorses daily chocolate Ensure and Magic Cup at facility. RD will provide nutrition supplements to aid with calorie/protein needs.   Current weight for patient 55.8 kg (122.8 lb) taken 04/30/18, recommend obtaining new wt to fully assess needs.   I/Os: +1494 ml since admit Medications reviewed and include: Keppra Rocephin, D5  with KCl 20 mEq Labs: Na 150 K 3.2 Mean Plasma Glucose 116.89  NUTRITION - FOCUSED PHYSICAL EXAM: Deferred; patient sleeping during visit   Diet Order:   Diet Order            Diet full liquid Room service appropriate? Yes; Fluid consistency: Thin  Diet effective now              EDUCATION NEEDS:   No education needs have been identified at this time  Skin:  Skin Assessment: Reviewed RN Assessment(petechiae; abdomen; leg)  Last BM:  11/2- type 7; black;medium  Height:   Ht Readings from Last 1 Encounters:  04/30/18 5\' 1"  (1.549 m)    Weight:   Wt Readings from Last 1 Encounters:  04/30/18 55.8 kg    Ideal Body Weight:  47.7 kg  BMI:  There is no height or weight on file to calculate BMI.  Estimated Nutritional Needs:   Kcal:  1400-1600  Protein:  70-80  Fluid:  >/= 1.4 L/day   Lajuan Lines, RD, LDN Clinical Nutrition Office [phone] After Hours/Weekend Pager: [phone]

## 2018-12-11 NOTE — Consult Note (Signed)
Consultation Note Date: 12/11/2018   Patient Name: Tina Kline  DOB: [birth year]  MRN: [id number]  Age / Sex: 85 y.o., female  PCP: [name], MD Referring Physician: Barton Dubois, MD  Reason for Consultation: Establishing goals of care and Psychosocial/spiritual support  HPI/Patient Profile: 85 y.o. female  with  with past medical history of dementia, seizure disorder, HTN, dysphagia, CKD III, anemia of chronic kidney disease, severe protein calorie malnutrition; [facility] resident, somnolence/lethargy, poor oral intake. Seen by Tina Kline 11/'19  admitted on 12/10/2018 with UTI sepsis.   Clinical Assessment and Goals of Care: Tina Kline is resting quietly in bed.  She appears weak and frail,  She will look in my direction, but not make eye contact.  Daughter Tina Kline is at bedside.  We talk about Tina Kline acute and chronic illness burden. Tina Kline calls her sister Tina Kline to participate in conversations.    As we are discussing the chronic illness pathway, attending hospitalist arrives.  Discussion related to code status, PEG tube, and GOC are discussed by attending.  All questions asked and answered.    PMT continues to discuss how to care for Tina Kline in the future.  We plan for a family meeting tomorrow morning at bedside. At this point, family agree no PEG tube.    HCPOA  NEXT OF KIN - NO Legal guardian.  Family makes choices as a team.  Daughter Tina Kline is main contact.  Also daughters Tina Kline, Tina Kline, son Tina Kline.    SUMMARY OF RECOMMENDATIONS   Continue to treat the treatable Considering CODE STATUS NO PEG tube.    Code Status/Advance Care Planning:  Full code -  Family discussion related to code status, PMT to follow tomorrow.   Symptom Management:   Per hospitalist, no additional needs at this time.   Palliative Prophylaxis:   Aspiration, Frequent Pain  Assessment and Turn Reposition  Additional Recommendations (Limitations, Scope, Preferences):  treat the treatable, considering code status.   Psycho-social/Spiritual:   Desire for further Chaplaincy support:no  Additional Recommendations: Caregiving  Support/Resources and Education on Hospice  Prognosis:   Unable to determine, based on outcomes.  6 months or less would not be surprising based on poor functional status, aspiration risk.   Discharge Planning: anticipate return to residential SNF.       Primary Diagnoses: Present on Admission:  Sepsis ([place])  Dementia ([place])  Acute renal failure superimposed on stage 3 chronic kidney disease ([place])  Dehydration  Hypernatremia   I have reviewed the medical record, interviewed the patient and family, and examined the patient. The following aspects are pertinent.  Past Medical History:  Diagnosis Date   Anemia    Dementia ([place])    Difficulty in walking    Dysphagia    Failure to thrive (0-17)    Femur fracture ([facility])    Hypertension    Protein calorie malnutrition ([facility])    Renal disorder    Seizures ([facility])    Social History   Socioeconomic History   Marital status: Widowed  Spouse name: Not on file   Number of children: 6   Years of education: Not on file   Highest education level: Not on file  Occupational History   Not on file  Social Needs   Financial resource strain: Not hard at all   Food insecurity    Worry: Never true    Inability: Never true   Transportation needs    Medical: No    Non-medical: No  Tobacco Use   Smoking status: Never Smoker   Smokeless tobacco: Never Used  Substance and Sexual Activity   Alcohol use: No   Drug use: No   Sexual activity: Not on file  Lifestyle   Physical activity    Days per week: 0 days    Minutes per session: 0 min   Stress: Not at all  Relationships   Social connections    Talks on phone: More than three times a week    Gets  together: Never    Attends religious service: Never    Active member of club or organization: No    Attends meetings of clubs or organizations: Never    Relationship status: Widowed  Other Topics Concern   Not on file  Social History Narrative   Pt lives at [facility], has of dementia ,  Uses a wheelchair,  had hip surgery this year.    Family History  Problem Relation Age of Onset   Breast cancer Daughter    Pancreatic cancer Son    Colon cancer Son    Scheduled Meds:  donepezil  10 mg Oral QHS   heparin injection (subcutaneous)  5,000 Units Subcutaneous Q8H   levETIRAcetam  250 mg Oral BID   traZODone  50 mg Oral QHS   Continuous Infusions:  cefTRIAXone (ROCEPHIN)  IV     dextrose 5 % with KCl 20 mEq / L 20 mEq (12/11/18 1001)   PRN Meds:.acetaminophen, influenza vaccine adjuvanted, ondansetron **OR** ondansetron (ZOFRAN) IV Medications Prior to Admission:  Prior to Admission medications   Medication Sig Start Date End Date Taking? Authorizing Provider  acetaminophen (TYLENOL) 325 MG tablet Take 650 mg by mouth every 8 (eight) hours.    Yes [provider]  donepezil (ARICEPT) 10 MG tablet Take 10 mg by mouth at bedtime.   Yes [provider]  ferrous sulfate 325 (65 FE) MG EC tablet Take 1 tablet (325 mg total) by mouth 2 (two) times daily. 06/23/17 12/10/18 Yes Erline Hau, MD  levETIRAcetam (KEPPRA) 100 MG/ML solution Take 250 mg by mouth 2 (two) times daily.  12/10/18  Yes [provider]  senna (SENOKOT) 8.6 MG TABS tablet Take 1 tablet by mouth 2 (two) times daily.   Yes [provider]  torsemide (DEMADEX) 20 MG tablet Take 1 tablet (20 mg total) by mouth daily. 04/17/18  Yes Barton Dubois, MD  traZODone (DESYREL) 50 MG tablet Take 0.5 tablets (25 mg total) by mouth at bedtime. Patient taking differently: Take 50 mg by mouth at bedtime.  01/03/18  Yes Barton Dubois, MD  vitamin C (ASCORBIC ACID) 500 MG tablet Take  500 mg by mouth 2 (two) times daily.   Yes [provider]   No Known Allergies Review of Systems  Unable to perform ROS: Dementia    Physical Exam Vitals signs and nursing note reviewed.     Vital Signs: BP 112/66 (BP Location: Left Arm)    Pulse (!) 106    Temp 98.1 F (36.7 C) (  Oral)    Resp 16    SpO2 100%  Pain Scale: 0-10   Pain Score: 0-No pain   SpO2: SpO2: 100 % O2 Device:SpO2: 100 % O2 Flow Rate: .   IO: Intake/output summary:   Intake/Output Summary (Last 24 hours) at 12/11/2018 1206 Last data filed at 12/11/2018 1000 Gross per 24 hour  Intake 1614.62 ml  Output --  Net 1614.62 ml    LBM: Last BM Date: 12/11/18 Baseline Weight:   Most recent weight:       Palliative Assessment/Data:   Flowsheet Rows     Most Recent Value  Intake Tab  Referral Department  Hospitalist  Unit at Time of Referral  Med/Surg Unit  Palliative Care Primary Diagnosis  Pulmonary  Date Notified  12/10/18  Palliative Care Type  Return patient Palliative Care  Reason for referral  Clarify Goals of Care  Date of Admission  12/10/18  Date first seen by Palliative Care  12/11/18  # of days Palliative referral response time  1 Day(s)  # of days IP prior to Palliative referral  0  Clinical Assessment  Palliative Performance Scale Score  30%  Pain Max last 24 hours  Not able to report  Pain Min Last 24 hours  Not able to report  Dyspnea Max Last 24 Hours  Not able to report  Dyspnea Min Last 24 hours  Not able to report  Psychosocial & Spiritual Assessment  Palliative Care Outcomes      Time In: 0910 Time Out: 1020 Time Total: 70 minutes  Greater than 50%  of this time was spent counseling and coordinating care related to the above assessment and plan.  Signed by: Drue Novel, NP   Please contact Palliative Medicine Team phone at [phone] for questions and concerns.  For individual provider: See Shea Evans

## 2018-12-11 NOTE — Progress Notes (Signed)
PROGRESS NOTE    Tina Kline  IOE:[id number] DOB: [birth year] DOA: 12/10/2018 PCP: [name], MD     Brief Narrative:  85 y.o. female with a past medical history significant for dementia, seizure disorder, hypertension, dysphagia, chronic kidney disease stage III, anemia of chronic kidney disease and severe protein calorie malnutrition; who was sent from the skilled nursing facility secondary to increase somnolence/lethargy and poor oral intake.  Patient symptoms have been present for the last 2 to 3 days prior to admission.  There has not been any reports of fever, sick contacts, diarrhea, hematuria, hematochezia, abdominal pain, chest pain, shortness of breath, melena, hematochezia, vomiting, focal weakness or any other complaints. In the ED patient was found tachycardic, with significant elevation of her WBCs in the 30,000 range, at their urine suggesting UTI, acute on chronic renal failure with a creatinine up to 2.10 from 1.3 at baseline; and also hypernatremia.  Patient met sepsis criteria, received 1 bolus of normal saline, cultures taken and patient started on antibiotics.  TRH has been consulted to admit patient for further evaluation and management of sepsis due to UTI, dehydration and acute on chronic renal failure  Assessment & Plan: 1-sepsis ([place]) -In the setting of UTI -Continue IV fluids and IV antibiotics -Follow clinical response. -Follow WBCs trend  2-Dysphagia -Speech therapy has been consulted to reevaluate patient's ability to safely swallow fluids. -Daughter unsure if she was not following any thickening, but she expressed that the patient is using dysphagia 1 diet.  3-Dementia ([place]) -Continue Aricept and supportive care -No behavioral disturbances appreciated at this time.  4-Acute renal failure superimposed on stage 3 chronic kidney disease ([place]) -In the setting of dehydration and UTI -Continue holding/minimizing nephrotoxic agent-continue IV antibiotics  -Continue IV fluids.  5-Dehydration and Hypernatremia -continue IVF's -follow electrolytes trend -Na 150  6-acute toxic/metabolic encephalopathy -Continue electrolytes repletion and stabilization -continue IV antibiotics   7-Hx of seizure disorder -continue Keppra  8-depression/insomnia Continue trazodone   DVT prophylaxis: Heparin Code Status: Full code Family Communication: Daughter at bedside. Disposition Plan: Continue IV antibiotics, follow culture results; follow speech therapy recommendation.  Consultants:   Palliative care  Speech therapy  Procedures:   See below for x-ray report.  Antimicrobials:  Anti-infectives (From admission, onward)   Start     Dose/Rate Route Frequency Ordered Stop   12/11/18 1500  cefTRIAXone (ROCEPHIN) 1 g in sodium chloride 0.9 % 100 mL IVPB     1 g 200 mL/hr over 30 Minutes Intravenous Every 24 hours 12/10/18 1452     12/10/18 1415  cefTRIAXone (ROCEPHIN) 1 g in sodium chloride 0.9 % 100 mL IVPB     1 g 200 mL/hr over 30 Minutes Intravenous  Once 12/10/18 1413 12/10/18 1520       Subjective: Continues to be disoriented, now eating monitor; currently afebrile.  Still mildly tachycardic.  Objective: Vitals:   12/10/18 1950 12/11/18 0528 12/11/18 0543 12/11/18 1416  BP:  112/66  99/68  Pulse: 98 (!) 110 (!) 106 96  Resp:  16  18  Temp:    97.6 F (36.4 C)  TempSrc:    Oral  SpO2:  100%  100%    Intake/Output Summary (Last 24 hours) at 12/11/2018 1615 Last data filed at 12/11/2018 1300 Gross per 24 hour  Intake 634.62 ml  Output -  Net 634.62 ml   There were no vitals filed for this visit.  Examination: General exam: Alert and awake; disoriented.  Afebrile and appears in  no acute distress.  Still with poor appetite. Respiratory system: Clear to auscultation. Respiratory effort normal.  Good O2 sat on room air. Cardiovascular system: Mild sinus tachycardia.. No rubs or gallops. Gastrointestinal system: Abdomen is  nondistended, soft and nontender. No organomegaly or masses felt. Normal bowel sounds heard. Central nervous system: Alert and oriented. No focal neurological deficits. Extremities: No cyanosis or clubbing. Skin: No rashes, no petechiae. Psychiatry: Mood & affect appropriate.    Data Reviewed: I have personally reviewed following labs and imaging studies  CBC: Recent Labs  Lab 12/10/18 1248  WBC 29.2*  NEUTROABS 26.2*  HGB 12.2  HCT 38.9  MCV 98.2  PLT 670*   Basic Metabolic Panel: Recent Labs  Lab 12/10/18 1248 12/11/18 0525  NA 147* 150*  K 3.5 3.2*  CL 106 111  CO2 26 21*  GLUCOSE 150* 129*  BUN 88* 92*  CREATININE 2.10* 1.82*  CALCIUM 9.0 8.7*  MG 2.6*  --   PHOS 4.9*  --    GFR: CrCl cannot be calculated (Unknown ideal weight.).  HbA1C: Recent Labs    12/10/18 1248  HGBA1C 5.7*   Urine analysis:    Component Value Date/Time   COLORURINE YELLOW 12/10/2018 1248   APPEARANCEUR HAZY (A) 12/10/2018 1248   LABSPEC 1.015 12/10/2018 1248   PHURINE 5.0 12/10/2018 1248   GLUCOSEU NEGATIVE 12/10/2018 1248   HGBUR NEGATIVE 12/10/2018 1248   BILIRUBINUR NEGATIVE 12/10/2018 1248   KETONESUR NEGATIVE 12/10/2018 1248   PROTEINUR NEGATIVE 12/10/2018 1248   UROBILINOGEN 0.2 01/15/2013 0425   NITRITE NEGATIVE 12/10/2018 1248   LEUKOCYTESUR MODERATE (A) 12/10/2018 1248    Radiology Studies: Dg Chest Port 1 View  Result Date: 12/10/2018 CLINICAL DATA:  WEAKNESS, PER ER NOTE, [facility] staff reports pt has been lethargic and not eating or drinking much for the past couple of days. REports they attempted to get blood work to check labs but was unable. ALso reports attempted i.*comment was truncated*weakness EXAM: PORTABLE CHEST 1 VIEW COMPARISON:  Radiograph 04/13/2018 FINDINGS: Normal cardiac silhouette with ectatic aorta. Lungs are hyperinflated. No effusion, or infiltrate pneumothorax. IMPRESSION: No acute cardiopulmonary process. Electronically Signed   By: Suzy Bouchard M.D.   On: 12/10/2018 14:08        Scheduled Meds: . donepezil  10 mg Oral QHS  . feeding supplement (ENSURE ENLIVE)  237 mL Oral BID BM  . heparin injection (subcutaneous)  5,000 Units Subcutaneous Q8H  . levETIRAcetam  250 mg Oral BID  . traZODone  50 mg Oral QHS   Continuous Infusions: . cefTRIAXone (ROCEPHIN)  IV 1 g (12/11/18 1459)  . dextrose 5 % with KCl 20 mEq / L 20 mEq (12/11/18 1001)     LOS: 1 day    Time spent:30 minutes.   Barton Dubois, MD [facility] Pager [phone]   12/11/2018, 4:15 PM

## 2018-12-11 NOTE — Plan of Care (Signed)
Pt is disoriented x4 upon admission. No family currently at bedside. Unable to educate at this time.

## 2018-12-12 DIAGNOSIS — R627 Adult failure to thrive: Secondary | ICD-10-CM

## 2018-12-12 DIAGNOSIS — N171 Acute kidney failure with acute cortical necrosis: Secondary | ICD-10-CM

## 2018-12-12 DIAGNOSIS — N183 Chronic kidney disease, stage 3 unspecified: Secondary | ICD-10-CM

## 2018-12-12 DIAGNOSIS — A419 Sepsis, unspecified organism: Secondary | ICD-10-CM

## 2018-12-12 DIAGNOSIS — Z7189 Other specified counseling: Secondary | ICD-10-CM

## 2018-12-12 LAB — BASIC METABOLIC PANEL
Anion gap: 15 (ref 5–15)
BUN: 89 mg/dL — ABNORMAL HIGH (ref 8–23)
CO2: 21 mmol/L — ABNORMAL LOW (ref 22–32)
Calcium: 8.6 mg/dL — ABNORMAL LOW (ref 8.9–10.3)
Chloride: 112 mmol/L — ABNORMAL HIGH (ref 98–111)
Creatinine, Ser: 1.42 mg/dL — ABNORMAL HIGH (ref 0.44–1.00)
GFR calc Af Amer: 38 mL/min — ABNORMAL LOW (ref >60)
GFR calc non Af Amer: 33 mL/min — ABNORMAL LOW (ref >60)
Glucose, Bld: 145 mg/dL — ABNORMAL HIGH (ref 70–99)
Potassium: 3.3 mmol/L — ABNORMAL LOW (ref 3.5–5.1)
Sodium: 148 mmol/L — ABNORMAL HIGH (ref 135–145)

## 2018-12-12 LAB — CBC
HCT: 38.6 % (ref 36.0–46.0)
Hemoglobin: 11.7 g/dL — ABNORMAL LOW (ref 12.0–15.0)
MCH: 30.7 pg (ref 26.0–34.0)
MCHC: 30.3 g/dL (ref 30.0–36.0)
MCV: 101.3 fL — ABNORMAL HIGH (ref 80.0–100.0)
Platelets: 413 10*3/uL — ABNORMAL HIGH (ref 150–400)
RBC: 3.81 MIL/uL — ABNORMAL LOW (ref 3.87–5.11)
RDW: 13.2 % (ref 11.5–15.5)
WBC: 25.1 10*3/uL — ABNORMAL HIGH (ref 4.0–10.5)
nRBC: 0.1 % (ref 0.0–0.2)

## 2018-12-12 MED ORDER — SODIUM CHLORIDE 0.9 % IV BOLUS
1000.0000 mL | Freq: Once | INTRAVENOUS | Status: AC
  Administered 2018-12-12: 1000 mL via INTRAVENOUS

## 2018-12-12 MED ORDER — POTASSIUM CL IN DEXTROSE 5% 20 MEQ/L IV SOLN
20.0000 meq | INTRAVENOUS | Status: DC
  Administered 2018-12-12 – 2018-12-13 (×2): 20 meq via INTRAVENOUS
  Filled 2018-12-12 (×5): qty 1000

## 2018-12-12 MED ORDER — SODIUM CHLORIDE 0.9 % IV BOLUS: 500.0000 mL | Freq: Once | INTRAVENOUS | Status: DC

## 2018-12-12 MED ORDER — SODIUM CHLORIDE 0.9 % IV SOLN
250.0000 mg | Freq: Two times a day (BID) | INTRAVENOUS | Status: DC
  Administered 2018-12-12 – 2018-12-13 (×2): 250 mg via INTRAVENOUS
  Filled 2018-12-12 (×4): qty 2.5

## 2018-12-12 NOTE — Care Management Important Message (Signed)
Important Message  Patient Details  Name: Tina Kline MRN: [id number] Date of Birth: 01-23-1930   Medicare Important Message Given:  Yes     Tommy Medal 12/12/2018, 11:57 AM

## 2018-12-12 NOTE — Evaluation (Signed)
Clinical/Bedside Swallow Evaluation Patient Details  Name: Tina Kline MRN: [id number] Date of Birth: Mar 08, 1929  Today's Date: 12/12/2018 Time: SLP Start Time (ACUTE ONLY): 0913 SLP Stop Time (ACUTE ONLY): 0932 SLP Time Calculation (min) (ACUTE ONLY): 19 min  Past Medical History:  Past Medical History:  Diagnosis Date  . Anemia   . Dementia ([place])   . Difficulty in walking   . Dysphagia   . Failure to thrive (0-17)   . Femur fracture ([place])   . Hypertension   . Protein calorie malnutrition ([place])   . Renal disorder   . Seizures ([place])    Past Surgical History:  Past Surgical History:  Procedure Laterality Date  . APPENDECTOMY    . BIOPSY  06/22/2017   Procedure: BIOPSY;  Surgeon: Daneil Dolin, MD;  [facility];  Service: Endoscopy;;  gastric   . COLONOSCOPY N/A 06/26/2015   Dr. Oneida Alar: Single tubular adenoma removed.  No future colonoscopies for surveillance purposes given age.  . ESOPHAGEAL DILATION N/A 06/22/2017   Procedure: ESOPHAGEAL DILATION;  Surgeon: Daneil Dolin, MD;  [facility];  Service: Endoscopy;  Laterality: N/A;  . ESOPHAGOGASTRODUODENOSCOPY N/A 01/29/2016   Dr. Oneida Alar: proximal esophageal web s/p dilation, large hiatal hernia, gastritis  . ESOPHAGOGASTRODUODENOSCOPY N/A 03/08/2016   Dr. Oneida Alar: proximal esophagel web s/p dilation, gastritis, moderate hiatal hernia  . ESOPHAGOGASTRODUODENOSCOPY  06/2015   Dr. Oneida Alar: Large hiatal hernia, gastritis, proximal esophageal web status post dilation  . ESOPHAGOGASTRODUODENOSCOPY (EGD) WITH PROPOFOL N/A 06/22/2017   Procedure: ESOPHAGOGASTRODUODENOSCOPY (EGD) WITH PROPOFOL;  Surgeon: Daneil Dolin, MD;  [facility];  Service: Endoscopy;  Laterality: N/A;  . INTRAMEDULLARY (IM) NAIL INTERTROCHANTERIC Right 04/14/2018   Procedure: INTRAMEDULLARY (IM) NAIL INTERTROCHANTRIC;  Surgeon: Carole Civil, MD;  Location: [facility];  Service: Orthopedics;  Laterality: Right;  1000 am  .  MOUTH SURGERY    . SAVORY DILATION N/A 01/29/2016   Procedure: SAVORY DILATION;  Surgeon: Danie Binder, MD;  [facility];  Service: Endoscopy;  Laterality: N/A;  . SAVORY DILATION N/A 03/08/2016   Procedure: SAVORY DILATION;  Surgeon: Danie Binder, MD;  [facility];  Service: Endoscopy;  Laterality: N/A;   HPI:  85 y.o.femalewith a past medical history significant for dementia, seizure disorder, hypertension, dysphagia, chronic kidney disease stage III, anemia of chronic kidney disease and severe protein calorie malnutrition; who was sent from the skilled nursing facility secondary to increase somnolence/lethargy and poor oral intake. Patient symptoms have been present for the last 2 to 3 days prior to admission.There has not been any reports of fever, sick contacts, diarrhea, hematuria, hematochezia, abdominal pain, chest pain, shortness of breath, melena, hematochezia, vomiting, focal weakness or any other complaints. In the ED patient was found tachycardic, with significant elevation of her WBCs in the 30,000 range, at their urine suggesting UTI, acute on chronic renal failure with a creatinine up to 2.10 from 1.3 at baseline; and also hypernatremia. Patient met sepsis criteria, received 1 bolus of normal saline, cultures taken and patient started on antibiotics. TRH has been consulted to admit patient for further evaluation and management of sepsis due to UTI, dehydration and acute on chronic renal failure. BSE requested. Pt has been seen by SLP for BSE a year ago with recommendation for full liquids. Pt was admitted from [facility].   Assessment / Plan / Recommendation Clinical Impression  Pt presents with cognitive based dysphagia characterized by reduced attention to task,  oral expectoration at times, oral holding, and variable reduced labial seal with ice chips, straw sips thin, and puree. Pt only opened mouth a small amount for puree trials and benefited from liquid  wash. Pt with one strong cough after the first presentation of straw sip thin liquid, however no additional. Pt preferred grape juice over water. Recommend continued full liquids as ordered (this has been baseline diet for some time) with 100% feeder assist. Pt will likely consume liquids more readily than pureed solids due to her cognitive deficits and seeming oral aversion to solids. SLP will follow during acute stay for caregiver education (family is not present at this time). Discussed with RN.  SLP Visit Diagnosis: Dysphagia, unspecified (R13.10)    Aspiration Risk  Mild aspiration risk;Risk for inadequate nutrition/hydration    Diet Recommendation Dysphagia 1 (Puree);Thin liquid(continue full liquids)   Liquid Administration via: Straw;Cup Medication Administration: Crushed with puree Supervision: Full supervision/cueing for compensatory strategies Compensations: Slow rate;Small sips/bites Postural Changes: Seated upright at 90 degrees;Remain upright for at least 30 minutes after po intake    Other  Recommendations Oral Care Recommendations: Oral care BID;Staff/trained caregiver to provide oral care Other Recommendations: Clarify dietary restrictions   Follow up Recommendations 24 hour supervision/assistance      Frequency and Duration min 2x/week  1 week       Prognosis Prognosis for Safe Diet Advancement: Fair Barriers to Reach Goals: Severity of deficits;Cognitive deficits      Swallow Study   General Date of Onset: 12/10/18 HPI: 84 y.o.femalewith a past medical history significant for dementia, seizure disorder, hypertension, dysphagia, chronic kidney disease stage III, anemia of chronic kidney disease and severe protein calorie malnutrition; who was sent from the skilled nursing facility secondary to increase somnolence/lethargy and poor oral intake. Patient symptoms have been present for the last 2 to 3 days prior to admission.There has not been any reports of fever,  sick contacts, diarrhea, hematuria, hematochezia, abdominal pain, chest pain, shortness of breath, melena, hematochezia, vomiting, focal weakness or any other complaints. In the ED patient was found tachycardic, with significant elevation of her WBCs in the 30,000 range, at their urine suggesting UTI, acute on chronic renal failure with a creatinine up to 2.10 from 1.3 at baseline; and also hypernatremia. Patient met sepsis criteria, received 1 bolus of normal saline, cultures taken and patient started on antibiotics. TRH has been consulted to admit patient for further evaluation and management of sepsis due to UTI, dehydration and acute on chronic renal failure. BSE requested. Pt has been seen by SLP for BSE a year ago with recommendation for full liquids. Pt was admitted from [facility]. Type of Study: Bedside Swallow Evaluation Previous Swallow Assessment: 01/02/18 BSE full liquids Diet Prior to this Study: Thin liquids;Dysphagia 1 (puree)(full liquids) Temperature Spikes Noted: No Respiratory Status: Room air History of Recent Intubation: No Behavior/Cognition: Alert;Cooperative;Requires cueing;Doesn't follow directions Oral Cavity Assessment: Within Functional Limits Oral Care Completed by SLP: Recent completion by staff Oral Cavity - Dentition: Edentulous Vision: Impaired for self-feeding Self-Feeding Abilities: Total assist Patient Positioning: Upright in bed Baseline Vocal Quality: Normal Volitional Cough: Cognitively unable to elicit Volitional Swallow: Unable to elicit    Oral/Motor/Sensory Function Overall Oral Motor/Sensory Function: Generalized oral weakness   Ice Chips Ice chips: Within functional limits Presentation: Spoon   Thin Liquid Thin Liquid: Impaired Presentation: Straw Oral Phase Impairments: Reduced labial seal(Pt expectorating at times) Oral Phase Functional Implications: Prolonged oral transit;Oral holding    Nectar Thick Nectar Thick Liquid:  Not tested   Honey  Thick Honey Thick Liquid: Not tested   Puree Puree: Impaired Presentation: Spoon Oral Phase Impairments: Reduced lingual movement/coordination Oral Phase Functional Implications: Oral holding   Solid     Solid: Not tested     Thank you,  Genene Churn, [place]  , 12/12/2018,10:23 AM

## 2018-12-12 NOTE — Progress Notes (Signed)
Palliative: Mrs. Barbour is lying quietly in bed.  She has known dementia and does not respond to me in any meaningful way.  She appears acutely/chronically ill, frail and cachectic.  Present today at bedside his daughter [name].  [name] called her sister and brother, Otilio Saber and Mallie Mussel, for a family conference.  We review selected labs and the treatment plan.  I share that although Mrs. Cortazar white blood cells have reduced some, they are still very high (25 from 30).  I share that it remains to be seen her ability to recover.  We talk in detail about the chronic illness pathway related to dementia.  We talked about nutritional challenges, family continues to decline artificial feeding.  Laverne asks about Mrs. Latchford ability to return to residential SNF and support herself nutritionally being fed by others.  We talked about concerns related to loved ones eating better when they are fed by family, the natural progression of dementia which includes weight loss.  [name] endorses marked weight loss over the last year.  We talked about how to make choices for loved ones including 1) keeping them at the center of decision-making, not what we want for them 2) are we doing something FOR her or TO her (can we change what is happening) 3) the person Mrs. Goslin was 10 years ago, how would that woman tell them to care for her now.    During our conversation son Mallie Mussel asks about residential hospice.  We talked about DNR status, treat the treatable but allowing natural passing.  We also talked about the concept of "let nature take its course".  I give an example of when this is appropriate.  We talked in detail about what is and is not provided in hospice care.  I share that just like I will get sick again, Mrs. Pool will get sick again.  I share that modern medicine can prolong life, but also prolong the dying process and thereby prolong suffering.  We talked about options including: 1) continue to treat the  treatable, returning to residential SNF when able 2) continue to treat the treatable, returning to residential SNF with hospice (deciding at the time of next infection whether to treat or not) 3) residential hospice   Plan:   At this point, FULL code/scope.  Considering code status, residential hsopice.  PMT to follow up 11/5.   75 minutes, extended time Quinn Axe, NP Palliative Medicine Team Team Phone # [phone] Greater than 50% of this time was spent counseling and coordinating care related to the above assessment and plan.

## 2018-12-12 NOTE — Progress Notes (Signed)
Took much coaxing for patient to drink keppra mixed in small amount of juice.  Offered chocolate ensure and drank about 30 mls through straw after lenghty coaxing.  Daughter at bedside trying to help and patient did no better for her.

## 2018-12-12 NOTE — Progress Notes (Signed)
PROGRESS NOTE  Tina Kline DIY:[id number] DOB: 1929/05/19 DOA: 12/10/2018 PCP: [name], MD  Brief History:  85 y.o.femalewith a past medical history significant for dementia, seizure disorder, hypertension, dysphagia, chronic kidney disease stage III, anemia of chronic kidney disease and severe protein calorie malnutrition; who was sent from the skilled nursing facility secondary to increase somnolence/lethargy and poor oral intake x 2-3 days. In the ED patient was found tachycardic, with significant elevation of her WBCs in the 30,000 range, at their urine suggesting UTI, acute on chronic renal failure with a creatinine up to 2.10 from 1.3 at baseline; and also hypernatremia. Patient met sepsis criteria, received 1 bolus of normal saline, cultures taken and patient started on antibiotics. TRH has been consulted to admit patient for further evaluation and management of sepsis due to UTI, dehydration and acute on chronic renal failure.  Despite treatment with antibiotics and IV fluids, the patient remained somnolent with continued poor oral intake.  Palliative medicine was consulted to assist with goals of care discussion.  Assessment/Plan: sepsis  -present on admission -In the setting of UTI -Continue IV fluids and IV antibiotics -Follow clinical response.  Dysphagia -Speech therapy has been consulted to reevaluate patient's ability to safely swallow fluids. -appreciate speech therapy-->dysphagia 1 with thin liquids  Acute metabolic encephalopathy -not much improvement despite IV abx and IVF -CT brain--no acute intracranial findings -UA 21-50 WBC -check ammonia -check B12 -Check TSH -Check folate  Dementia without behavioral disturbance -Continue Aricept and supportive care -No behavioral disturbances appreciated at this time.  Acute renal failure superimposed on stage 3 chronic kidney disease  -In the setting of dehydration and UTI -baseline creatinine  1.1-1.4 -presented with serum creatinine 2.10 -Continue IV fluids.  Dehydration and Hypernatremia -continue hypotonic fluids -am CBC -increase D5W rate -12/12/18--noted 0.9NS was running, not D5W  Failure to Thrive --Patient's family describes continued functional and cognitive decline consistent with the patient's natural history of dementia progression  Hx of seizure disorder -continue Keppra--change to IV  Goals of Care -palliative medicine consult -Advance care planning, including the explanation and discussion of advance directives was carried out with the patient and family.  Code status including explanations of "Full Code" and "DNR" and alternatives were discussed in detail.  Discussion of end-of-life issues including but not limited palliative care, hospice care and the concept of hospice, other end-of-life care options, power of attorney for health care decisions, living wills, and physician orders for life-sustaining treatment were also discussed with the patient and family.  Total face to face time 16 minutes.   Total time spent 35 minutes.  Greater than 50% spent face to face counseling and coordinating care.      Disposition Plan:   SNF vs residential hospice 1-2 days Family Communication:   Daughter updated at bedside 11/4  Consultants:  Palliative medicine  Code Status:  FULL  DVT Prophylaxis:  [place] Heparin    Procedures: As Listed in Progress Note Above  Antibiotics: Ceftriaxone 11/2>>>     Subjective: Patient opens her eyes but does not follow commands.  She denies any pain.  Otherwise review of systems is unobtainable secondary to patient's mental status.  Nursing reports the patient remains somnolent and difficult to arouse to take medications.  No reports of respiratory distress, uncontrolled pain, vomiting, diarrhea.  Objective: Vitals:   12/11/18 1416 12/11/18 2059 12/12/18 0607 12/12/18 1304  BP: 99/68 120/71 118/62 109/69  Pulse: 96 (!)  103  97 97  Resp: _0 Temp: 97.6 F (36.4 C) 98.5 F (36.9 C) 97.6 F (36.4 C) 97.9 F (36.6 C)  TempSrc: Oral Oral Oral   SpO2: 100% 94% 100% 100%    Intake/Output Summary (Last 24 hours) at 12/12/2018 1722 Last data filed at 12/12/2018 1300 Gross per 24 hour  Intake 600 ml  Output -  Net 600 ml   Weight change:  Exam:   General:  Pt is alert, does not follows command appropriately, not in acute distress  HEENT: No icterus, No thrush, No neck mass, [place]/AT  Cardiovascular: RRR, S1/S2, no rubs, no gallops  Respiratory: Poor inspiratory effort.  Bibasilar rales.  No wheezing.  Abdomen: Soft/+BS, non tender, non distended, no guarding  Extremities: No edema, No lymphangitis, No petechiae, No rashes, no synovitis   Data Reviewed: I have personally reviewed following labs and imaging studies Basic Metabolic Panel: Recent Labs  Lab 12/10/18 1248 12/11/18 0525 12/12/18 0436  NA 147* 150* 148*  K 3.5 3.2* 3.3*  CL 106 111 112*  CO2 26 21* 21*  GLUCOSE 150* 129* 145*  BUN 88* 92* 89*  CREATININE 2.10* 1.82* 1.42*  CALCIUM 9.0 8.7* 8.6*  MG 2.6*  --   --   PHOS 4.9*  --   --    Liver Function Tests: No results for input(s): AST, ALT, ALKPHOS, BILITOT, PROT, ALBUMIN in the last 168 hours. No results for input(s): LIPASE, AMYLASE in the last 168 hours. No results for input(s): AMMONIA in the last 168 hours. Coagulation Profile: No results for input(s): INR, PROTIME in the last 168 hours. CBC: Recent Labs  Lab 12/10/18 1248 12/12/18 0559  WBC 29.2* 25.1*  NEUTROABS 26.2*  --   HGB 12.2 11.7*  HCT 38.9 38.6  MCV 98.2 101.3*  PLT 437* 413*   Cardiac Enzymes: No results for input(s): CKTOTAL, CKMB, CKMBINDEX, TROPONINI in the last 168 hours. BNP: Invalid input(s): POCBNP CBG: No results for input(s): GLUCAP in the last 168 hours. HbA1C: Recent Labs    12/10/18 1248  HGBA1C 5.7*   Urine analysis:    Component Value Date/Time   COLORURINE  YELLOW 12/10/2018 1248   APPEARANCEUR HAZY (A) 12/10/2018 1248   LABSPEC 1.015 12/10/2018 1248   PHURINE 5.0 12/10/2018 1248   GLUCOSEU NEGATIVE 12/10/2018 1248   HGBUR NEGATIVE 12/10/2018 1248   BILIRUBINUR NEGATIVE 12/10/2018 1248   KETONESUR NEGATIVE 12/10/2018 1248   PROTEINUR NEGATIVE 12/10/2018 1248   UROBILINOGEN 0.2 01/15/2013 0425   NITRITE NEGATIVE 12/10/2018 1248   LEUKOCYTESUR MODERATE (A) 12/10/2018 1248   Sepsis Labs: _1 (procalcitonin:4,lacticidven:4) ) Recent Results (from the past 240 hour(s))  Urine culture     Status: None   Collection Time: 12/10/18 12:48 PM   Specimen: Urine, Catheterized  Result Value Ref Range Status   Specimen Description   Final    URINE, CATHETERIZED Performed at [facility], [address], [place], [place] [address]    Special Requests   Final    NONE Performed at [facility], [address] [place], [place] [address]    Culture   Final    NO GROWTH Performed at [facility], [place] [address] [place], [place] [address]    Report Status 12/11/2018 FINAL  Final  Novel Coronavirus, NAA (Hosp order, Send-out to Ref Lab;  18-24 hrs     Status: None   Collection Time: 12/10/18 12:50 PM   Specimen: Nasopharyngeal Swab; Respiratory  Result Value Ref Range  Status   SARS-CoV-2, NAA NOT DETECTED NOT DETECTED Final    Comment: (NOTE) This nucleic acid amplification test was developed and its performance characteristics determined by [employer]. Nucleic acid amplification tests include PCR and TMA. This test has not been FDA cleared or approved. This test has been authorized by FDA under an Emergency Use Authorization (EUA). This test is only authorized for the duration of time the declaration that circumstances exist justifying the authorization of the emergency use of in vitro diagnostic tests for detection of SARS-CoV-2 virus and/or diagnosis of COVID-19 infection under section 564(b)(1) of the Act, 21  U.S.C. 491PHX-5(A) (1), unless the authorization is terminated or revoked sooner. When diagnostic testing is negative, the possibility of a false negative result should be considered in the context of a patient's recent exposures and the presence of clinical signs and symptoms consistent with COVID-19. An individual without symptoms of COVID- 19 and who is not shedding SARS-CoV-2 vi rus would expect to have a negative (not detected) result in this assay. Performed At: [facility] [place], [place] [id number] Rush Farmer MD KP:[id number]    [place]  Final    Comment: Performed at [facility], [address] [place], [place] [address]     Scheduled Meds: . donepezil  10 mg Oral QHS  . feeding supplement (ENSURE ENLIVE)  237 mL Oral BID BM  . heparin injection (subcutaneous)  5,000 Units Subcutaneous Q8H   Continuous Infusions: . cefTRIAXone (ROCEPHIN)  IV 1 g (12/12/18 1619)  . dextrose 5 % with KCl 20 mEq / L    . levETIRAcetam    . sodium chloride      Procedures/Studies: Dg Chest Port 1 View  Result Date: 12/10/2018 CLINICAL DATA:  WEAKNESS, PER ER NOTE, [facility] staff reports pt has been lethargic and not eating or drinking much for the past couple of days. REports they attempted to get blood work to check labs but was unable. ALso reports attempted i.*comment was truncated*weakness EXAM: PORTABLE CHEST 1 VIEW COMPARISON:  Radiograph 04/13/2018 FINDINGS: Normal cardiac silhouette with ectatic aorta. Lungs are hyperinflated. No effusion, or infiltrate pneumothorax. IMPRESSION: No acute cardiopulmonary process. Electronically Signed   By: Suzy Bouchard M.D.   On: 12/10/2018 14:08    Orson Eva, DO  [facility] Pager [phone]  If 7PM-7AM, please contact night-coverage [email or web address] Password [email or web address] 12/12/2018, 5:22 PM   LOS: 2 days

## 2018-12-13 LAB — CBC
HCT: 41.3 % (ref 36.0–46.0)
Hemoglobin: 12.4 g/dL (ref 12.0–15.0)
MCH: 30.8 pg (ref 26.0–34.0)
MCHC: 30 g/dL (ref 30.0–36.0)
MCV: 102.7 fL — ABNORMAL HIGH (ref 80.0–100.0)
Platelets: 423 10*3/uL — ABNORMAL HIGH (ref 150–400)
RBC: 4.02 MIL/uL (ref 3.87–5.11)
RDW: 13.6 % (ref 11.5–15.5)
WBC: 31.3 10*3/uL — ABNORMAL HIGH (ref 4.0–10.5)
nRBC: 0.1 % (ref 0.0–0.2)

## 2018-12-13 LAB — BASIC METABOLIC PANEL
Anion gap: 14 (ref 5–15)
BUN: 86 mg/dL — ABNORMAL HIGH (ref 8–23)
CO2: 19 mmol/L — ABNORMAL LOW (ref 22–32)
Calcium: 8.4 mg/dL — ABNORMAL LOW (ref 8.9–10.3)
Chloride: 117 mmol/L — ABNORMAL HIGH (ref 98–111)
Creatinine, Ser: 1.55 mg/dL — ABNORMAL HIGH (ref 0.44–1.00)
GFR calc Af Amer: 35 mL/min — ABNORMAL LOW (ref >60)
GFR calc non Af Amer: 30 mL/min — ABNORMAL LOW (ref >60)
Glucose, Bld: 159 mg/dL — ABNORMAL HIGH (ref 70–99)
Potassium: 3.9 mmol/L (ref 3.5–5.1)
Sodium: 150 mmol/L — ABNORMAL HIGH (ref 135–145)

## 2018-12-13 LAB — MAGNESIUM: Magnesium: 2.8 mg/dL — ABNORMAL HIGH (ref 1.7–2.4)

## 2018-12-13 MED ORDER — LORAZEPAM 2 MG/ML IJ SOLN: 0.5000 mg | INTRAMUSCULAR | Status: DC | PRN

## 2018-12-13 MED ORDER — ARTIFICIAL TEARS OPHTHALMIC OINT
TOPICAL_OINTMENT | OPHTHALMIC | Status: DC | PRN
  Filled 2018-12-13: qty 3.5

## 2018-12-13 MED ORDER — MORPHINE SULFATE (CONCENTRATE) 10 MG/0.5ML PO SOLN: 2.6000 mg | ORAL | Status: DC | PRN

## 2018-12-13 MED ORDER — POLYVINYL ALCOHOL 1.4 % OP SOLN: 2.0000 [drp] | OPHTHALMIC | Status: DC | PRN

## 2018-12-13 MED ORDER — POLYVINYL ALCOHOL 1.4 % OP SOLN: 2.0000 [drp] | OPHTHALMIC | 0 refills | Status: AC | PRN

## 2018-12-13 NOTE — Discharge Summary (Signed)
Physician Discharge Summary  Tina Kline VFI:[id number] DOB: September 16, 1929 DOA: 12/10/2018  PCP: [name], MD  Admit date: 12/10/2018 Discharge date: 12/13/2018  Admitted From: SNF Disposition:  Residential Hospice     Discharge Condition: Stable CODE STATUS: FULL COMFORT Diet recommendation: comfort feeds   Brief/Interim Summary: 85 y.o.femalewith a past medical history significant for dementia, seizure disorder, hypertension, dysphagia, chronic kidney disease stage III, anemia of chronic kidney disease and severe protein calorie malnutrition; who was sent from the skilled nursing facility secondary to increase somnolence/lethargy and poor oral intake x 2-3 days. In the ED patient was found tachycardic, with significant elevation of her WBCs in the 30,000 range, at their urine suggesting UTI, acute on chronic renal failure with a creatinine up to 2.10 from 1.3 at baseline; and also hypernatremia. Patient met sepsis criteria, received 1 bolus of normal saline, cultures taken and patient started on antibiotics. TRH has been consulted to admit patient for further evaluation and management of sepsis due to UTI, dehydration and acute on chronic renal failure.  Despite treatment with antibiotics and IV fluids, the patient remained somnolent with continued poor oral intake.  Palliative medicine was consulted to assist with goals of care discussion. Her IV fluids were increased and work up for reversible causes of mental status change were investigated.   Her antibiotics were continued.  Unfortunately, the patient remained somnolent with very little to no oral intake.  Palliative medicine continued to have [place] discussions. Ultimately, the patient's family felt that it was in the patient's best interest to transition the patient's care to focus on her comfort only.  Social work/TOC team helped with patient's transition to residential hospice.  Discharge Diagnoses:  sepsis  -present on  admission -secondary to UTI -Continue IV fluids and IV antibiotics -WBC count continues to rise -further work up was entertained; however, after [place] discussion, the patient's focus of care was transitioned to focus on her comfort only  Dysphagia -Speech therapy has been consulted to reevaluate patient's ability to safely swallow fluids. -appreciate speech therapy-->dysphagia 1 with thin liquids  Acute metabolic encephalopathy -not much improvement despite IV abx and IVF -CT brain--no acute intracranial findings -UA 21-50 WBC -check ammonia -check B12 -Check TSH -Check folate -patient changed to comfort care-->labs cancelled  Dementia without behavioral disturbance -Continue Aricept and supportive care -No behavioral disturbances appreciated at this time.  Acute renal failure superimposed on stage 3 chronic kidney disease  -In the setting of dehydration and UTI -baseline creatinine 1.1-1.4 -presented with serum creatinine 2.10 -Continue IV fluid-->slow improvement  DehydrationandHypernatremia -continue hypotonic fluids -am CBC -increased D5W rate -12/12/18--noted 0.9NS was running, not D5W -Na did not improve much despite hypotonic fluids  Failure to Thrive --Patient's family describes continued functional and cognitive decline consistent with the patient's natural history of dementia progression  Hx ofseizure disorder -continue Keppra--change to IV  Goals of Care -palliative medicine consult -Advance care planning, including the explanation and discussion of advance directives was carried out with the patient and family.  Code status including explanations of "Full Code" and "DNR" and alternatives were discussed in detail.  Discussion of end-of-life issues including but not limited palliative care, hospice care and the concept of hospice, other end-of-life care options, power of attorney for health care decisions, living wills, and physician orders for  life-sustaining treatment were also discussed with the patient and family.  Total face to face time 16 minutes. -12/13/18--family agreed to transition focus to full comfort after palliative medicine meeting  Discharge Instructions   Allergies as of 12/13/2018   No Known Allergies     Medication List    STOP taking these medications   donepezil 10 MG tablet Commonly known as: ARICEPT   ferrous sulfate 325 (65 FE) MG EC tablet   levETIRAcetam 100 MG/ML solution Commonly known as: KEPPRA   senna 8.6 MG Tabs tablet Commonly known as: SENOKOT   torsemide 20 MG tablet Commonly known as: DEMADEX   traZODone 50 MG tablet Commonly known as: DESYREL   vitamin C 500 MG tablet Commonly known as: ASCORBIC ACID     TAKE these medications   acetaminophen 325 MG tablet Commonly known as: TYLENOL Take 650 mg by mouth every 8 (eight) hours.   polyvinyl alcohol 1.4 % ophthalmic solution Commonly known as: LIQUIFILM TEARS Place 2 drops into both eyes as needed for dry eyes.      [place] to.   Why: [facility] information: [address] [place] [address] [phone]          No Known Allergies  Consultations:  Palliative medicine   Procedures/Studies: Dg Chest Port 1 View  Result Date: 12/10/2018 CLINICAL DATA:  WEAKNESS, PER ER NOTE, [facility] staff reports pt has been lethargic and not eating or drinking much for the past couple of days. REports they attempted to get blood work to check labs but was unable. ALso reports attempted i.*comment was truncated*weakness EXAM: PORTABLE CHEST 1 VIEW COMPARISON:  Radiograph 04/13/2018 FINDINGS: Normal cardiac silhouette with ectatic aorta. Lungs are hyperinflated. No effusion, or infiltrate pneumothorax. IMPRESSION: No acute cardiopulmonary process. Electronically Signed   By: Suzy Bouchard M.D.   On: 12/10/2018 14:08        Discharge Exam: Vitals:   12/12/18  2229 12/13/18 0523  BP: 114/63 122/68  Pulse: (!) 108 (!) 103  Resp:  16  Temp:  97.7 F (36.5 C)  SpO2: 100% 100%   Vitals:   12/12/18 2016 12/12/18 2039 12/12/18 2229 12/13/18 0523  BP: (!) 94/59  114/63 122/68  Pulse: (!) 111  (!) 108 (!) 103  Resp: 17   16  Temp: 98.1 F (36.7 C)   97.7 F (36.5 C)  TempSrc: Oral   Oral  SpO2: 100% 97% 100% 100%    General: Pt is alert, awake, not in acute distress, does not follow commands Cardiovascular: RRR, S1/S2 +, no rubs, no gallops Respiratory: bibasilar rales. Poor inspiratory effort Abdominal: Soft, NT, ND, bowel sounds + Extremities: no edema, no cyanosis   The results of significant diagnostics from this hospitalization (including imaging, microbiology, ancillary and laboratory) are listed below for reference.    Significant Diagnostic Studies: Dg Chest Port 1 View  Result Date: 12/10/2018 CLINICAL DATA:  WEAKNESS, PER ER NOTE, [facility] staff reports pt has been lethargic and not eating or drinking much for the past couple of days. REports they attempted to get blood work to check labs but was unable. ALso reports attempted i.*comment was truncated*weakness EXAM: PORTABLE CHEST 1 VIEW COMPARISON:  Radiograph 04/13/2018 FINDINGS: Normal cardiac silhouette with ectatic aorta. Lungs are hyperinflated. No effusion, or infiltrate pneumothorax. IMPRESSION: No acute cardiopulmonary process. Electronically Signed   By: Suzy Bouchard M.D.   On: 12/10/2018 14:08     Microbiology: Recent Results (from the past 240 hour(s))  Urine culture     Status: None   Collection Time: 12/10/18 12:48 PM   Specimen: Urine, Catheterized  Result Value Ref  Range Status   Specimen Description   Final    URINE, CATHETERIZED Performed at [facility], [address] [place], [place] [address]    Special Requests   Final    NONE Performed at [facility] [place], [address] [place], [place] [address]    Culture   Final    NO GROWTH Performed at [facility], [place] [address], [place], [place] [address]    Report Status 12/11/2018 FINAL  Final  Novel Coronavirus, NAA (Hosp order, Send-out to Ref Lab; [place] 18-24 hrs     Status: None   Collection Time: 12/10/18 12:50 PM   Specimen: Nasopharyngeal Swab; Respiratory  Result Value Ref Range Status   SARS-CoV-2, NAA NOT DETECTED NOT DETECTED Final    Comment: (NOTE) This nucleic acid amplification test was developed and its performance characteristics determined by [employer]. Nucleic acid amplification tests include PCR and TMA. This test has not been FDA cleared or approved. This test has been authorized by FDA under an Emergency Use Authorization (EUA). This test is only authorized for the duration of time the declaration that circumstances exist justifying the authorization of the emergency use of in vitro diagnostic tests for detection of SARS-CoV-2 virus and/or diagnosis of COVID-19 infection under section 564(b)(1) of the Act, 21 U.S.C. 546TKP-5(W) (1), unless the authorization is terminated or revoked sooner. When diagnostic testing is negative, the possibility of a false negative result should be considered in the context of a patient's recent exposures and the presence of clinical signs and symptoms consistent with COVID-19. An individual without symptoms of COVID- 19 and who is not shedding SARS-CoV-2 vi rus would expect to have a negative (not detected) result in this assay. Performed At: [facility] [place], [place] [id number] Rush Farmer MD [id number]    [place]  Final    Comment: Performed at [facility], [address], [place],  [address]     Labs: Basic Metabolic Panel: Recent Labs  Lab 12/10/18 1248 12/11/18 0525 12/12/18 0436 12/13/18 0432  NA 147* 150* 148* 150*  K 3.5 3.2* 3.3* 3.9  CL 106 111 112* 117*  CO2 26 21* 21* 19*  GLUCOSE 150* 129* 145* 159*  BUN 88* 92* 89* 86*    CREATININE 2.10* 1.82* 1.42* 1.55*  CALCIUM 9.0 8.7* 8.6* 8.4*  MG 2.6*  --   --  2.8*  PHOS 4.9*  --   --   --    Liver Function Tests: No results for input(s): AST, ALT, ALKPHOS, BILITOT, PROT, ALBUMIN in the last 168 hours. No results for input(s): LIPASE, AMYLASE in the last 168 hours. No results for input(s): AMMONIA in the last 168 hours. CBC: Recent Labs  Lab 12/10/18 1248 12/12/18 0559 12/13/18 0432  WBC 29.2* 25.1* 31.3*  NEUTROABS 26.2*  --   --   HGB 12.2 11.7* 12.4  HCT 38.9 38.6 41.3  MCV 98.2 101.3* 102.7*  PLT 437* 413* 423*   Cardiac Enzymes: No results for input(s): CKTOTAL, CKMB, CKMBINDEX, TROPONINI in the last 168 hours. BNP: Invalid input(s): POCBNP CBG: No results for input(s): GLUCAP in the last 168 hours.  Time coordinating discharge:  36 minutes  Signed:  Orson Eva, DO [facility] Pager: [phone] 12/13/2018, 3:00 PM

## 2018-12-13 NOTE — Progress Notes (Signed)
Palliative: [name] is lying quietly in bed.  She appears quite weak and frail, does not respond to voice or touch.  There is no family at bedside at this time.  Call to daughter, Tina Kline at [phone], no message left.  Call to home number at [phone].  When Tina Kline and I initially speak, she shares that family had decided to wait until Friday for outcomes.  Tina Kline and I talked about Tina Kline worsening, not responding to voice or touch today, and white blood cells increasing even with IV antibiotics.  Tina Kline shares that another sibling is ill and Tina Kline and Tina Kline are driving to [facility] to visit.  Tina Kline shares that she will call her siblings.  I share the Tina Kline body is letting us know that she is not recovering.  I shared that the medical team recommends comfort and dignity, residential hospice.  Tina Kline tells me that she will reach out to her siblings and be at the hospital around 11 AM.  We also talked about CODE STATUS.  Tina Kline states that she will let God's will be done, talking about her and her family's faith.  She is unable to endorse DNR at this point, again stating she needs to call her siblings.  Family meeting at bedside.  Tina Kline remains lethargic, unable to make her basic needs known.  Present today at bedside is daughter Tina Kline.  She calls her siblings Tina Kline and Tina Kline who are traveling to see their Sister Tina Kline.  We talked about Tina Kline continued decline, lethargy, inability to sustain her nutrition and hydration.  Family is accepting of residential hospice for comfort and dignity at end-of-life, let nature take its course.  We talked about what is and is not provided in residential hospice.  I share that IV fluids and antibiotics are not given, giving it an example of how hydration can cause pain and suffering at end-of-life.  Family agrees to unburden Tina Kline for me any painful procedures or treatments, agreeable to medications for comfort.    Tina Kline  asks about prognosis, and prognosis is discussed with permission of all present.  I share that 1 week would not be surprising based on [name] continued decline, lethargy, inability to eat or drink.  Tina Kline seems a little surprised, but but I reassure her that I believe her mother's time is very short regardless of continuing with current treatment plan.  I reassured family that hospice staff are experts at providing care and will recognize changes, calling family to come if needed.  We talked about CODE STATUS, all family elects to allow a natural death/DNR.  [place] form completed.  Conference with attending and nursing staff related to patient condition, needs, goals of care comfort and dignity/residential hospice at [facility]. Orders adjusted.  Plan: Residential hospice for comfort and dignity at end-of-life, choice is [facility].  Let nature take its course.  Prognosis: 2 weeks or less expected based on severity of infection, white blood cells at 31, lethargy with poor by mouth intake, poor functional status, family's desire to focus on comfort and dignity, "let nature take its course" in residential hospice.  75 minutes, extended time Tina Axe, NP Palliative Medicine Team Team Phone # [phone] Greater than 50% of this time was spent counseling and coordinating care related to the above assessment and plan.

## 2018-12-13 NOTE — Progress Notes (Signed)
Report called to [facility].

## 2018-12-13 NOTE — TOC Progression Note (Signed)
    Trinity, Killelea [id number] (CSN: [id number]) (85 y.o. 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:  [employer]  [id number]  [place] [address]   [phone] -- --  SSN: Insurance: Marital Status: Religion:  [id number] MEDICARE [employer]  Patient Ethnicity & Race  Ethnic Group Patient Race  Not Hispanic or Latino Black or African American  Documents Filed to Patient  Power of Attorney Living Will Clinical Unknown Study Attachment Consent Form ABN Waiver After Visit Summary Lab Result Scan Code Status MyChart Status Advance Care Planning  Not on File Not on File Not on File Not on File Filed Not on [place] DNR [Updated on 12/13/18 1148] Pending Jump to the Activity  Admission Information  Current Information  Attending Provider Admitting Provider Admission Type Admission Status  Tat, Shanon Brow, MD Barton Dubois, MD Emergency Admission (Confirmed)       Admission Date/Time Discharge Date Hospital Service Auth/Cert Status  99991111 12:01 PM  Internal Medicine [facility] Room/Bed   [facility] AP-DEPT [facility] Account  Name Acct ID Class Status Primary Coverage  [place], [place] [id number] Inpatient Open MEDICARE - MEDICARE PART A AND B      Guarantor Account (for Hospital Account [id number])  Name Relation to Pt Service Area Active? Acct Type  Nile Riggs Self [facility]SA Yes Personal/Family  Address Phone    [place]  [id number]  [place], [place] [address] [phone])        Coverage Information (for Hospital Account [id number])  1. [place] PART A AND B  F/O Payor/Plan Precert #  MEDICARE/MEDICARE PART A AND B   Subscriber Subscriber #  Torilynn, [place] [id number]  Address Phone  [address] [place], [place] [address]   2. MEDICAID [place]/MEDICAID OF  [place]  F/O Payor/Plan Precert #  MEDICAID [employer]/MEDICAID OF [place]   Subscriber Subscriber #  Carlon, [place] [id number] O  Address Phone  [address]  [place], [place] [address] [phone]       Care Everywhere ID:  [id number]

## 2019-01-08 DEATH — deceased

## 2019-04-06 IMAGING — CT CT HEAD W/O CM
3 series · 15 of 47 positions shown, 18 images · non-contrast
Comparison: Brain MRI 06/21/2017

CLINICAL DATA: Recurrent syncope

EXAM:
CT HEAD WITHOUT CONTRAST
TECHNIQUE: Contiguous axial images were obtained from the base of the skull
through the vertex without intravenous contrast.

[Series 2: head trauma wo · axial · 0.47mm/px · z∈[+1490,+1615]mm · 9 of 31 slices shown, 12 images]
[im 3/31  brain]
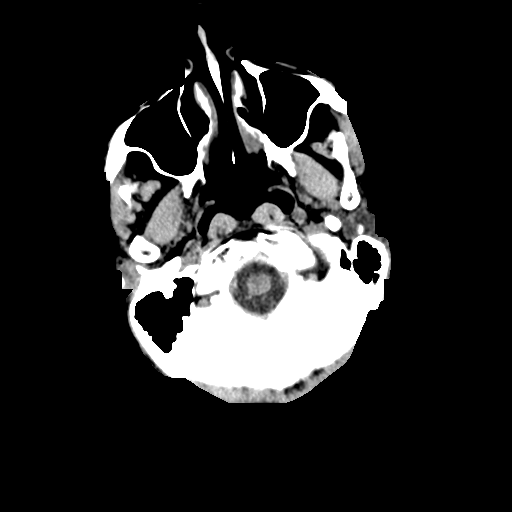
[im 3/31  bone]
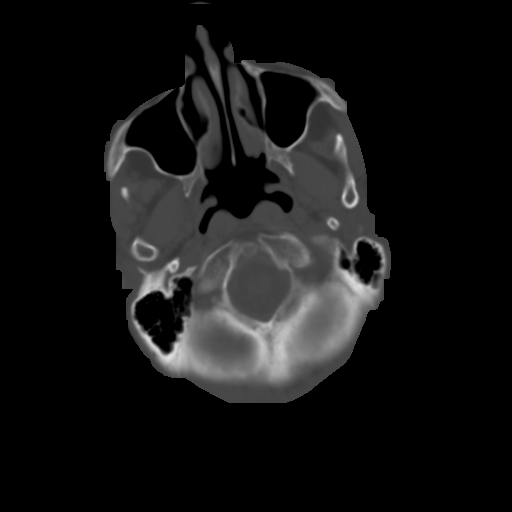
[im 6/31  brain]
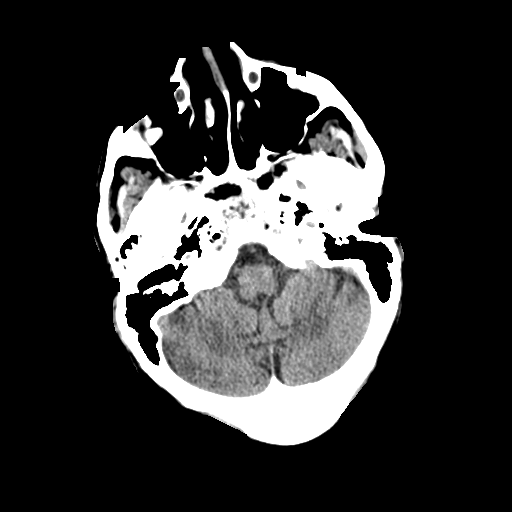
[im 9/31  brain]
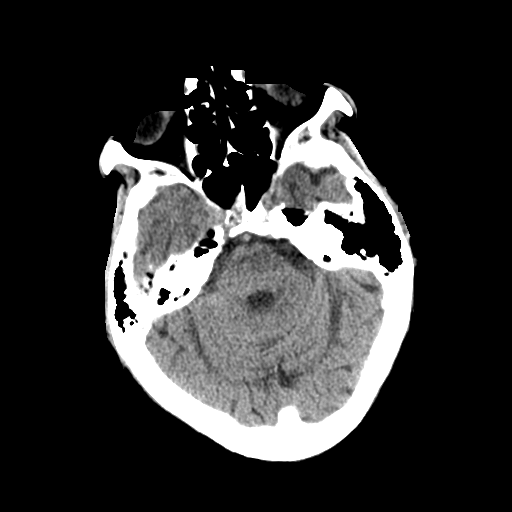
[im 12/31  brain]
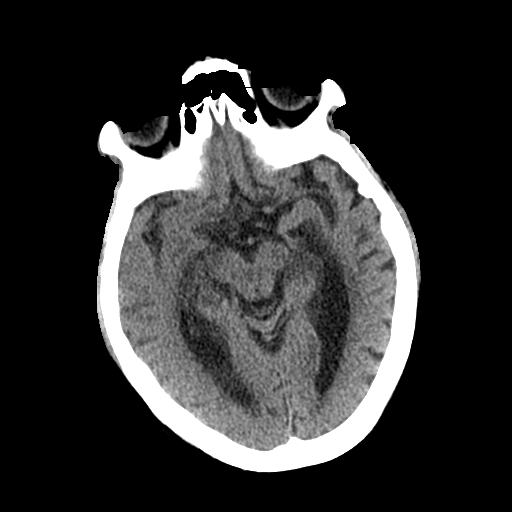
[im 16/31  brain]
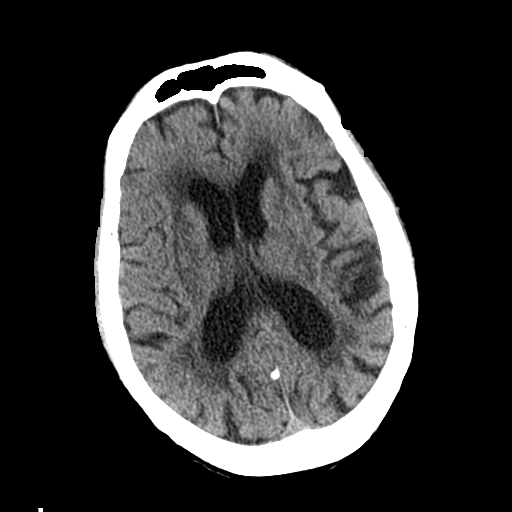
[im 16/31  bone]
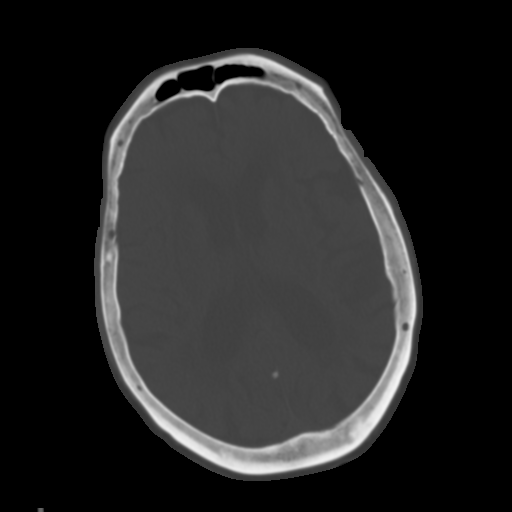
[im 19/31  brain]
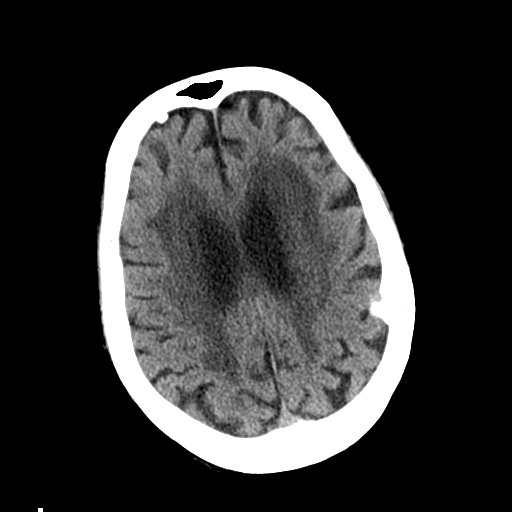
[im 22/31  brain]
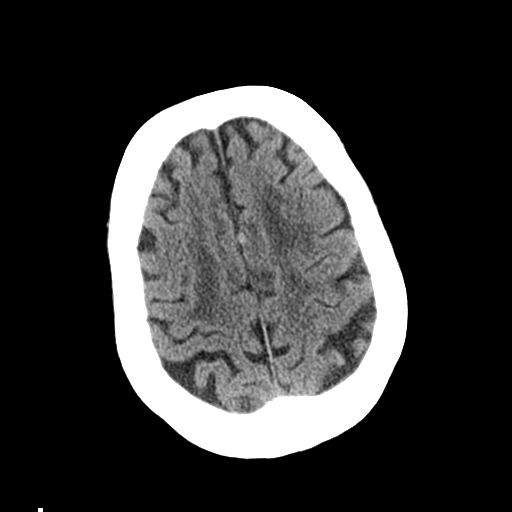
[im 25/31  brain]
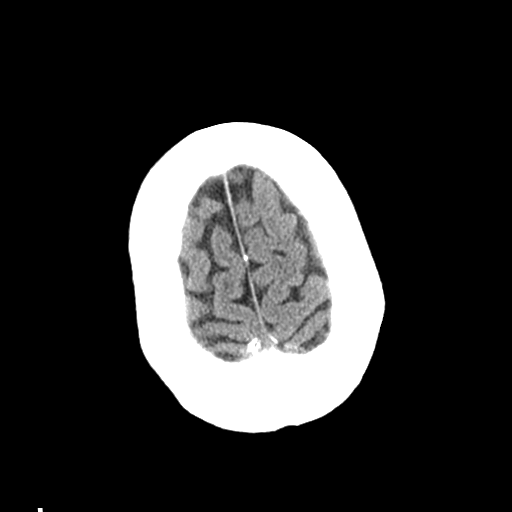
[im 28/31  brain]
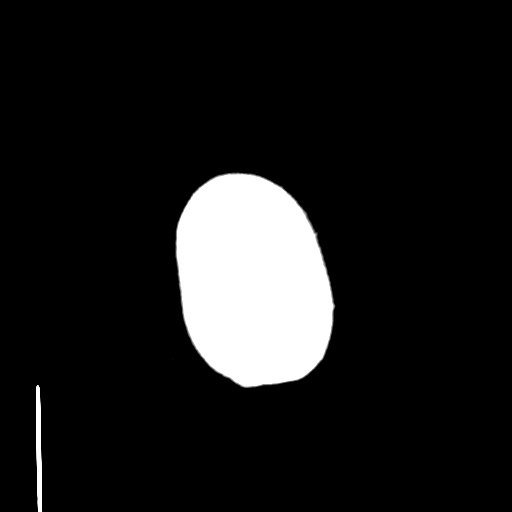
[im 28/31  bone]
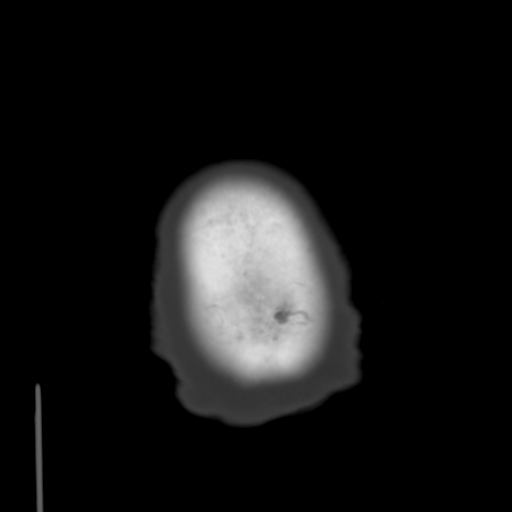

[Series 4: coronal soft tissue · coronal · 0.32mm/px · 3 of 74 slices shown]
[im 25/74  brain]
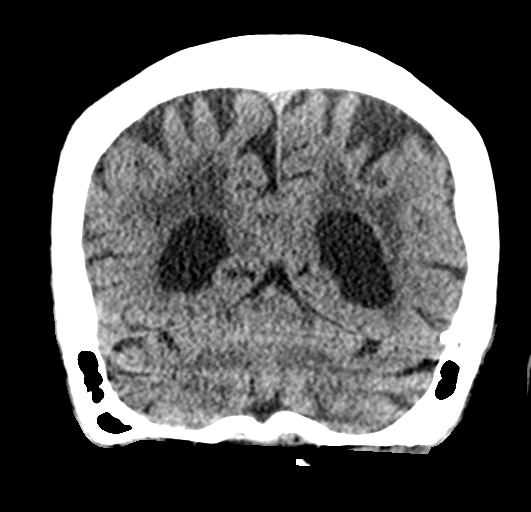
[im 33/74  brain]
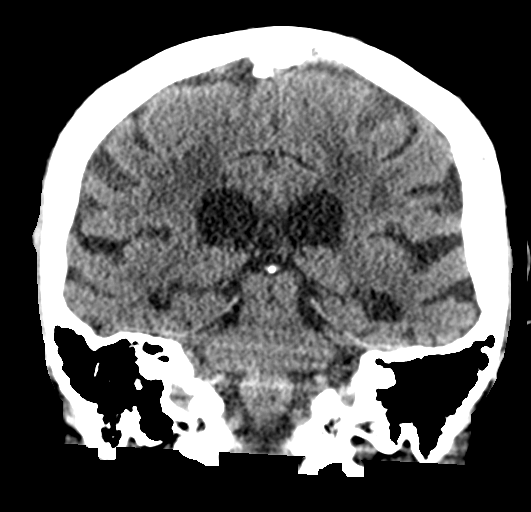
[im 41/74  brain]
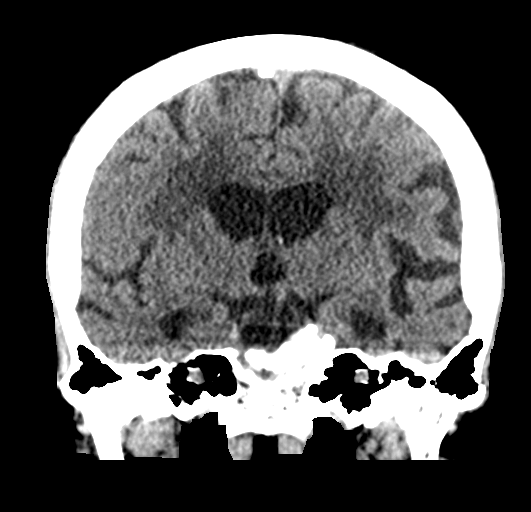

[Series 5: sagittal soft tissue · sagittal · 0.32mm/px · 3 of 57 slices shown]
[im 19/57  brain]
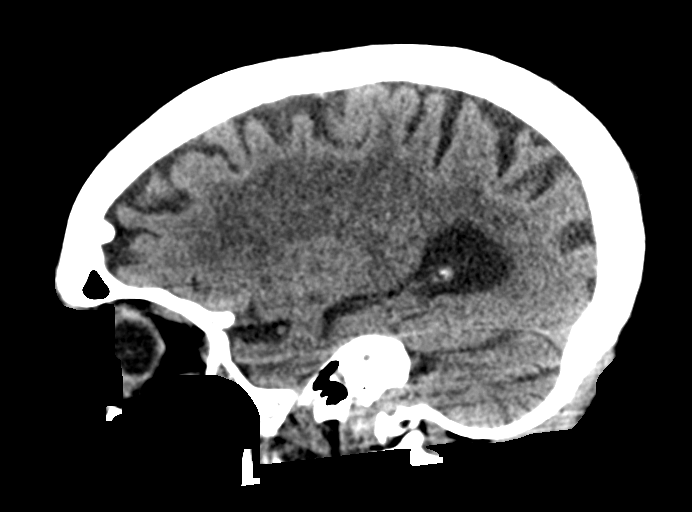
[im 29/57  brain]
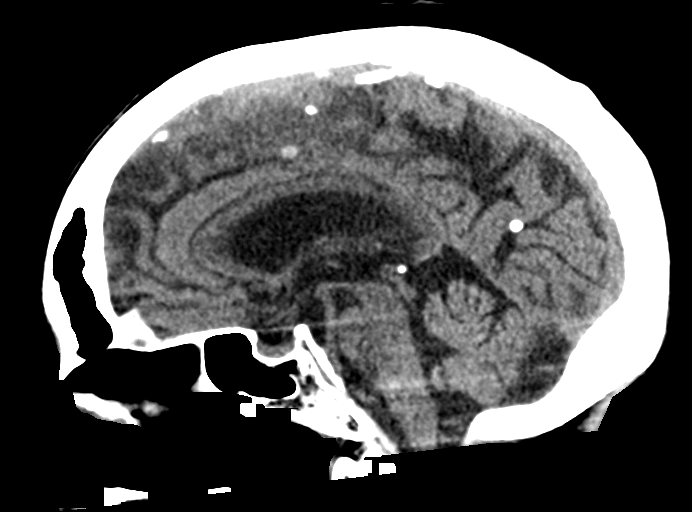
[im 38/57  brain]
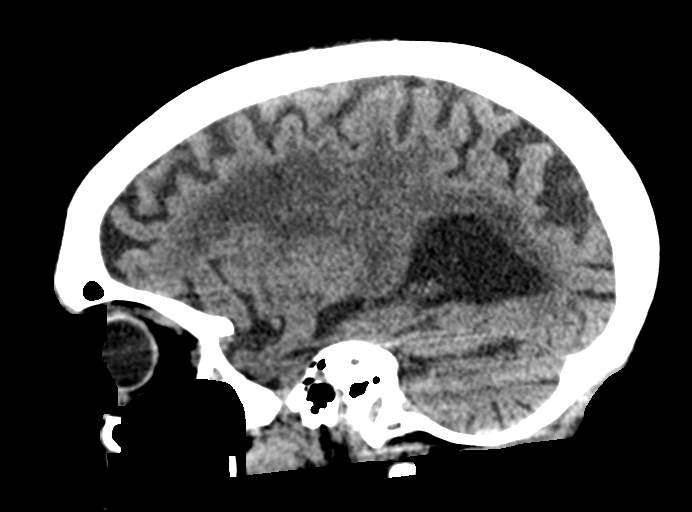

[15 of 47 positions shown; findings below may reference images not displayed]

FINDINGS: Brain: No evidence of acute infarction, hemorrhage, hydrocephalus,
extra-axial collection or mass lesion/mass effect. Chronic
small-vessel disease with confluent gliosis in the cerebral white
matter. Generalized atrophy.

Partially calcified meningioma along the left parietal convexity
measuring 13 mm. Small hemisphere nodule measuring 5 mm, also stable
and consistent with meningioma. Additional partially calcified
probable meningioma along the right from convexity measuring 6 mm.

Vascular: Atherosclerotic calcification

Skull: Negative

Sinuses/Orbits: Dysconjugate gaze, nonspecific
IMPRESSION: 1. No acute finding.
2. Advanced chronic small vessel ischemia.
3. Small meningiomas without progression.
